# Patient Record
Sex: Male | Born: 1935 | Race: White | Hispanic: No | Smoking: Former smoker
Health system: Southern US, Community
[De-identification: ages and names within clinical notes are randomized; demographics above are authoritative.]

## PROBLEM LIST (undated history)

## (undated) DIAGNOSIS — K635 Polyp of colon: Secondary | ICD-10-CM

## (undated) DIAGNOSIS — C3491 Malignant neoplasm of unspecified part of right bronchus or lung: Principal | ICD-10-CM

## (undated) DIAGNOSIS — Z7189 Other specified counseling: Secondary | ICD-10-CM

## (undated) DIAGNOSIS — I10 Essential (primary) hypertension: Secondary | ICD-10-CM

## (undated) DIAGNOSIS — Z803 Family history of malignant neoplasm of breast: Secondary | ICD-10-CM

## (undated) DIAGNOSIS — E119 Type 2 diabetes mellitus without complications: Secondary | ICD-10-CM

## (undated) DIAGNOSIS — I712 Thoracic aortic aneurysm, without rupture: Secondary | ICD-10-CM

## (undated) DIAGNOSIS — N400 Enlarged prostate without lower urinary tract symptoms: Secondary | ICD-10-CM

## (undated) DIAGNOSIS — I4891 Unspecified atrial fibrillation: Secondary | ICD-10-CM

## (undated) DIAGNOSIS — C3490 Malignant neoplasm of unspecified part of unspecified bronchus or lung: Secondary | ICD-10-CM

## (undated) DIAGNOSIS — C7931 Secondary malignant neoplasm of brain: Secondary | ICD-10-CM

## (undated) DIAGNOSIS — E7800 Pure hypercholesterolemia, unspecified: Secondary | ICD-10-CM

## (undated) DIAGNOSIS — Z5111 Encounter for antineoplastic chemotherapy: Secondary | ICD-10-CM

## (undated) DIAGNOSIS — Z87442 Personal history of urinary calculi: Secondary | ICD-10-CM

## (undated) HISTORY — PX: TONSILLECTOMY: SUR1361

## (undated) HISTORY — DX: Unspecified atrial fibrillation: I48.91

## (undated) HISTORY — DX: Encounter for antineoplastic chemotherapy: Z51.11

## (undated) HISTORY — DX: Essential (primary) hypertension: I10

## (undated) HISTORY — DX: Pure hypercholesterolemia, unspecified: E78.00

## (undated) HISTORY — DX: Malignant neoplasm of unspecified part of right bronchus or lung: C34.91

## (undated) HISTORY — DX: Personal history of urinary calculi: Z87.442

## (undated) HISTORY — DX: Family history of malignant neoplasm of breast: Z80.3

## (undated) HISTORY — PX: COLONOSCOPY W/ POLYPECTOMY: SHX1380

## (undated) HISTORY — DX: Type 2 diabetes mellitus without complications: E11.9

## (undated) HISTORY — DX: Secondary malignant neoplasm of brain: C79.31

## (undated) HISTORY — PX: CERVICAL SPINE SURGERY: SHX589

## (undated) HISTORY — PX: APPENDECTOMY: SHX54

## (undated) HISTORY — DX: Other specified counseling: Z71.89

## (undated) HISTORY — DX: Malignant neoplasm of unspecified part of unspecified bronchus or lung: C34.90

## (undated) HISTORY — PX: CHOLECYSTECTOMY: SHX55

---

## 1998-04-02 ENCOUNTER — Ambulatory Visit (HOSPITAL_COMMUNITY): Admission: RE | Admit: 1998-04-02 | Discharge: 1998-04-02 | Payer: Self-pay | Admitting: Urology

## 1999-09-06 ENCOUNTER — Encounter: Payer: Self-pay | Admitting: Urology

## 1999-09-06 ENCOUNTER — Ambulatory Visit (HOSPITAL_COMMUNITY): Admission: RE | Admit: 1999-09-06 | Discharge: 1999-09-06 | Payer: Self-pay | Admitting: Urology

## 1999-09-30 ENCOUNTER — Encounter: Payer: Self-pay | Admitting: Urology

## 1999-09-30 ENCOUNTER — Ambulatory Visit (HOSPITAL_COMMUNITY): Admission: RE | Admit: 1999-09-30 | Discharge: 1999-09-30 | Payer: Self-pay | Admitting: Urology

## 2000-01-31 ENCOUNTER — Encounter: Payer: Self-pay | Admitting: Urology

## 2000-01-31 ENCOUNTER — Encounter: Admission: RE | Admit: 2000-01-31 | Discharge: 2000-01-31 | Payer: Self-pay | Admitting: Dermatology

## 2000-03-21 ENCOUNTER — Encounter: Payer: Self-pay | Admitting: Urology

## 2000-03-21 ENCOUNTER — Ambulatory Visit (HOSPITAL_COMMUNITY): Admission: RE | Admit: 2000-03-21 | Discharge: 2000-03-21 | Payer: Self-pay | Admitting: Urology

## 2000-03-21 ENCOUNTER — Encounter (INDEPENDENT_AMBULATORY_CARE_PROVIDER_SITE_OTHER): Payer: Self-pay

## 2000-10-12 ENCOUNTER — Encounter: Payer: Self-pay | Admitting: Urology

## 2000-10-12 ENCOUNTER — Ambulatory Visit (HOSPITAL_COMMUNITY): Admission: RE | Admit: 2000-10-12 | Discharge: 2000-10-12 | Payer: Self-pay | Admitting: Urology

## 2001-03-23 ENCOUNTER — Encounter: Payer: Self-pay | Admitting: Urology

## 2001-03-23 ENCOUNTER — Ambulatory Visit (HOSPITAL_COMMUNITY): Admission: RE | Admit: 2001-03-23 | Discharge: 2001-03-23 | Payer: Self-pay | Admitting: General Surgery

## 2001-03-29 ENCOUNTER — Ambulatory Visit (HOSPITAL_COMMUNITY): Admission: RE | Admit: 2001-03-29 | Discharge: 2001-03-29 | Payer: Self-pay | Admitting: Urology

## 2001-03-29 ENCOUNTER — Encounter: Payer: Self-pay | Admitting: Urology

## 2001-04-17 ENCOUNTER — Ambulatory Visit (HOSPITAL_COMMUNITY): Admission: RE | Admit: 2001-04-17 | Discharge: 2001-04-17 | Payer: Self-pay | Admitting: Gastroenterology

## 2002-02-12 ENCOUNTER — Ambulatory Visit (HOSPITAL_COMMUNITY): Admission: RE | Admit: 2002-02-12 | Discharge: 2002-02-12 | Payer: Self-pay | Admitting: Cardiology

## 2002-02-12 ENCOUNTER — Encounter: Payer: Self-pay | Admitting: Cardiology

## 2003-09-23 ENCOUNTER — Encounter: Admission: RE | Admit: 2003-09-23 | Discharge: 2003-10-29 | Payer: Self-pay | Admitting: Family Medicine

## 2004-11-09 ENCOUNTER — Ambulatory Visit (HOSPITAL_COMMUNITY): Admission: RE | Admit: 2004-11-09 | Discharge: 2004-11-09 | Payer: Self-pay | Admitting: Gastroenterology

## 2005-08-08 ENCOUNTER — Ambulatory Visit (HOSPITAL_COMMUNITY): Admission: RE | Admit: 2005-08-08 | Discharge: 2005-08-08 | Payer: Self-pay | Admitting: Family Medicine

## 2006-09-21 ENCOUNTER — Ambulatory Visit (HOSPITAL_COMMUNITY): Admission: RE | Admit: 2006-09-21 | Discharge: 2006-09-21 | Payer: Self-pay | Admitting: Urology

## 2006-11-07 ENCOUNTER — Ambulatory Visit (HOSPITAL_COMMUNITY): Admission: RE | Admit: 2006-11-07 | Discharge: 2006-11-07 | Payer: Self-pay | Admitting: Family Medicine

## 2006-11-13 ENCOUNTER — Ambulatory Visit (HOSPITAL_COMMUNITY): Admission: RE | Admit: 2006-11-13 | Discharge: 2006-11-13 | Payer: Self-pay | Admitting: Family Medicine

## 2006-11-16 ENCOUNTER — Ambulatory Visit (HOSPITAL_COMMUNITY): Admission: RE | Admit: 2006-11-16 | Discharge: 2006-11-16 | Payer: Self-pay | Admitting: Thoracic Surgery

## 2006-11-17 ENCOUNTER — Ambulatory Visit (HOSPITAL_COMMUNITY): Admission: RE | Admit: 2006-11-17 | Discharge: 2006-11-17 | Payer: Self-pay | Admitting: Family Medicine

## 2007-02-20 ENCOUNTER — Encounter: Admission: RE | Admit: 2007-02-20 | Discharge: 2007-02-20 | Payer: Self-pay | Admitting: Thoracic Surgery

## 2007-02-20 ENCOUNTER — Ambulatory Visit: Payer: Self-pay | Admitting: Thoracic Surgery

## 2007-03-02 ENCOUNTER — Ambulatory Visit: Admission: RE | Admit: 2007-03-02 | Discharge: 2007-05-31 | Payer: Self-pay | Admitting: Radiation Oncology

## 2007-03-13 ENCOUNTER — Ambulatory Visit: Payer: Self-pay | Admitting: Thoracic Surgery

## 2007-03-13 ENCOUNTER — Encounter (INDEPENDENT_AMBULATORY_CARE_PROVIDER_SITE_OTHER): Payer: Self-pay | Admitting: Specialist

## 2007-03-13 ENCOUNTER — Inpatient Hospital Stay (HOSPITAL_COMMUNITY): Admission: RE | Admit: 2007-03-13 | Discharge: 2007-03-17 | Payer: Self-pay | Admitting: Thoracic Surgery

## 2007-03-13 HISTORY — PX: OTHER SURGICAL HISTORY: SHX169

## 2007-03-20 ENCOUNTER — Ambulatory Visit: Payer: Self-pay | Admitting: Internal Medicine

## 2007-03-21 ENCOUNTER — Encounter: Admission: RE | Admit: 2007-03-21 | Discharge: 2007-03-21 | Payer: Self-pay | Admitting: Thoracic Surgery

## 2007-03-21 ENCOUNTER — Ambulatory Visit: Payer: Self-pay | Admitting: Thoracic Surgery

## 2007-03-27 ENCOUNTER — Ambulatory Visit: Payer: Self-pay | Admitting: Thoracic Surgery

## 2007-03-27 ENCOUNTER — Encounter: Admission: RE | Admit: 2007-03-27 | Discharge: 2007-03-27 | Payer: Self-pay | Admitting: Thoracic Surgery

## 2007-04-04 ENCOUNTER — Ambulatory Visit: Payer: Self-pay | Admitting: Thoracic Surgery

## 2007-04-04 ENCOUNTER — Encounter: Admission: RE | Admit: 2007-04-04 | Discharge: 2007-04-04 | Payer: Self-pay | Admitting: Thoracic Surgery

## 2007-04-19 LAB — COMPREHENSIVE METABOLIC PANEL
BUN: 17 mg/dL (ref 6–23)
CO2: 22 mEq/L (ref 19–32)
Calcium: 9.9 mg/dL (ref 8.4–10.5)
Chloride: 102 mEq/L (ref 96–112)
Creatinine, Ser: 0.64 mg/dL (ref 0.40–1.50)
Glucose, Bld: 189 mg/dL — ABNORMAL HIGH (ref 70–99)

## 2007-04-19 LAB — CBC WITH DIFFERENTIAL/PLATELET
Basophils Absolute: 0.1 10*3/uL (ref 0.0–0.1)
HCT: 35.4 % — ABNORMAL LOW (ref 38.7–49.9)
HGB: 12.2 g/dL — ABNORMAL LOW (ref 13.0–17.1)
MONO#: 0.5 10*3/uL (ref 0.1–0.9)
NEUT%: 69.5 % (ref 40.0–75.0)
Platelets: 224 10*3/uL (ref 145–400)
WBC: 8.4 10*3/uL (ref 4.0–10.0)
lymph#: 1.6 10*3/uL (ref 0.9–3.3)

## 2007-04-24 ENCOUNTER — Encounter: Admission: RE | Admit: 2007-04-24 | Discharge: 2007-04-24 | Payer: Self-pay | Admitting: Thoracic Surgery

## 2007-04-24 ENCOUNTER — Ambulatory Visit: Payer: Self-pay | Admitting: Thoracic Surgery

## 2007-05-02 ENCOUNTER — Ambulatory Visit: Payer: Self-pay | Admitting: Thoracic Surgery

## 2007-07-11 ENCOUNTER — Encounter: Admission: RE | Admit: 2007-07-11 | Discharge: 2007-07-11 | Payer: Self-pay | Admitting: Thoracic Surgery

## 2007-07-11 ENCOUNTER — Ambulatory Visit: Payer: Self-pay | Admitting: Thoracic Surgery

## 2007-10-09 ENCOUNTER — Ambulatory Visit: Payer: Self-pay | Admitting: Internal Medicine

## 2007-10-11 LAB — CBC WITH DIFFERENTIAL/PLATELET
BASO%: 0.1 % (ref 0.0–2.0)
Basophils Absolute: 0 10*3/uL (ref 0.0–0.1)
HCT: 37.9 % — ABNORMAL LOW (ref 38.7–49.9)
HGB: 13 g/dL (ref 13.0–17.1)
MCHC: 34.3 g/dL (ref 32.0–35.9)
MONO#: 0.4 10*3/uL (ref 0.1–0.9)
NEUT#: 5.4 10*3/uL (ref 1.5–6.5)
NEUT%: 70.4 % (ref 40.0–75.0)
WBC: 7.7 10*3/uL (ref 4.0–10.0)
lymph#: 1.8 10*3/uL (ref 0.9–3.3)

## 2007-10-11 LAB — COMPREHENSIVE METABOLIC PANEL
ALT: 11 U/L (ref 0–53)
CO2: 25 mEq/L (ref 19–32)
Calcium: 10.3 mg/dL (ref 8.4–10.5)
Chloride: 104 mEq/L (ref 96–112)
Creatinine, Ser: 0.85 mg/dL (ref 0.40–1.50)
Total Protein: 6.3 g/dL (ref 6.0–8.3)

## 2007-10-12 ENCOUNTER — Ambulatory Visit (HOSPITAL_COMMUNITY): Admission: RE | Admit: 2007-10-12 | Discharge: 2007-10-12 | Payer: Self-pay | Admitting: Internal Medicine

## 2007-10-19 ENCOUNTER — Ambulatory Visit: Admission: RE | Admit: 2007-10-19 | Discharge: 2007-11-26 | Payer: Self-pay | Admitting: Radiation Oncology

## 2007-11-01 ENCOUNTER — Ambulatory Visit: Payer: Self-pay | Admitting: Thoracic Surgery

## 2008-04-08 ENCOUNTER — Ambulatory Visit: Payer: Self-pay | Admitting: Internal Medicine

## 2008-04-10 LAB — CBC WITH DIFFERENTIAL/PLATELET
Basophils Absolute: 0 10*3/uL (ref 0.0–0.1)
EOS%: 0.9 % (ref 0.0–7.0)
HCT: 39.8 % (ref 38.7–49.9)
HGB: 13.7 g/dL (ref 13.0–17.1)
LYMPH%: 22.5 % (ref 14.0–48.0)
MCH: 28.8 pg (ref 28.0–33.4)
MCHC: 34.4 g/dL (ref 32.0–35.9)
NEUT%: 70.5 % (ref 40.0–75.0)
Platelets: 213 10*3/uL (ref 145–400)
lymph#: 1.9 10*3/uL (ref 0.9–3.3)

## 2008-04-10 LAB — COMPREHENSIVE METABOLIC PANEL
AST: 9 U/L (ref 0–37)
BUN: 20 mg/dL (ref 6–23)
CO2: 23 mEq/L (ref 19–32)
Calcium: 9.8 mg/dL (ref 8.4–10.5)
Chloride: 104 mEq/L (ref 96–112)
Creatinine, Ser: 0.71 mg/dL (ref 0.40–1.50)
Total Bilirubin: 0.4 mg/dL (ref 0.3–1.2)

## 2008-04-14 ENCOUNTER — Ambulatory Visit (HOSPITAL_COMMUNITY): Admission: RE | Admit: 2008-04-14 | Discharge: 2008-04-14 | Payer: Self-pay | Admitting: Internal Medicine

## 2008-04-30 ENCOUNTER — Ambulatory Visit: Payer: Self-pay | Admitting: Thoracic Surgery

## 2008-10-07 ENCOUNTER — Ambulatory Visit: Payer: Self-pay | Admitting: Internal Medicine

## 2008-10-09 ENCOUNTER — Ambulatory Visit (HOSPITAL_COMMUNITY): Admission: RE | Admit: 2008-10-09 | Discharge: 2008-10-09 | Payer: Self-pay | Admitting: Internal Medicine

## 2008-10-09 LAB — CBC WITH DIFFERENTIAL/PLATELET
Basophils Absolute: 0 10*3/uL (ref 0.0–0.1)
Eosinophils Absolute: 0.1 10*3/uL (ref 0.0–0.5)
HCT: 39.5 % (ref 38.7–49.9)
HGB: 13.6 g/dL (ref 13.0–17.1)
LYMPH%: 23.6 % (ref 14.0–48.0)
MONO#: 0.4 10*3/uL (ref 0.1–0.9)
NEUT#: 5.6 10*3/uL (ref 1.5–6.5)
NEUT%: 69.7 % (ref 40.0–75.0)
Platelets: 217 10*3/uL (ref 145–400)
WBC: 8 10*3/uL (ref 4.0–10.0)
lymph#: 1.9 10*3/uL (ref 0.9–3.3)

## 2008-10-09 LAB — COMPREHENSIVE METABOLIC PANEL
CO2: 25 mEq/L (ref 19–32)
Calcium: 9.6 mg/dL (ref 8.4–10.5)
Chloride: 105 mEq/L (ref 96–112)
Creatinine, Ser: 0.69 mg/dL (ref 0.40–1.50)
Glucose, Bld: 148 mg/dL — ABNORMAL HIGH (ref 70–99)
Total Bilirubin: 0.7 mg/dL (ref 0.3–1.2)

## 2008-10-14 ENCOUNTER — Ambulatory Visit: Payer: Self-pay | Admitting: Thoracic Surgery

## 2009-03-09 ENCOUNTER — Emergency Department (HOSPITAL_COMMUNITY): Admission: EM | Admit: 2009-03-09 | Discharge: 2009-03-09 | Payer: Self-pay | Admitting: Emergency Medicine

## 2009-04-08 ENCOUNTER — Ambulatory Visit: Payer: Self-pay | Admitting: Internal Medicine

## 2009-04-10 ENCOUNTER — Ambulatory Visit (HOSPITAL_COMMUNITY): Admission: RE | Admit: 2009-04-10 | Discharge: 2009-04-10 | Payer: Self-pay | Admitting: Internal Medicine

## 2009-04-10 LAB — CBC WITH DIFFERENTIAL/PLATELET
BASO%: 0.2 % (ref 0.0–2.0)
Basophils Absolute: 0 10*3/uL (ref 0.0–0.1)
HCT: 38.6 % (ref 38.4–49.9)
HGB: 13.2 g/dL (ref 13.0–17.1)
LYMPH%: 21.7 % (ref 14.0–49.0)
MCH: 28.9 pg (ref 27.2–33.4)
MCHC: 34.1 g/dL (ref 32.0–36.0)
MONO#: 0.4 10*3/uL (ref 0.1–0.9)
NEUT%: 72.6 % (ref 39.0–75.0)
Platelets: 207 10*3/uL (ref 140–400)
WBC: 7.7 10*3/uL (ref 4.0–10.3)

## 2009-04-10 LAB — COMPREHENSIVE METABOLIC PANEL
ALT: 24 U/L (ref 0–53)
BUN: 21 mg/dL (ref 6–23)
CO2: 25 mEq/L (ref 19–32)
Creatinine, Ser: 0.72 mg/dL (ref 0.40–1.50)
Glucose, Bld: 140 mg/dL — ABNORMAL HIGH (ref 70–99)
Total Bilirubin: 0.8 mg/dL (ref 0.3–1.2)

## 2009-04-14 ENCOUNTER — Ambulatory Visit: Payer: Self-pay | Admitting: Thoracic Surgery

## 2009-10-08 ENCOUNTER — Ambulatory Visit: Payer: Self-pay | Admitting: Internal Medicine

## 2009-10-12 ENCOUNTER — Ambulatory Visit (HOSPITAL_COMMUNITY): Admission: RE | Admit: 2009-10-12 | Discharge: 2009-10-12 | Payer: Self-pay | Admitting: Internal Medicine

## 2009-10-12 LAB — CBC WITH DIFFERENTIAL/PLATELET
BASO%: 0.2 % (ref 0.0–2.0)
EOS%: 0.6 % (ref 0.0–7.0)
LYMPH%: 21.2 % (ref 14.0–49.0)
MCHC: 33.5 g/dL (ref 32.0–36.0)
MCV: 86.6 fL (ref 79.3–98.0)
MONO%: 5.1 % (ref 0.0–14.0)
Platelets: 211 10*3/uL (ref 140–400)
RBC: 4.7 10*6/uL (ref 4.20–5.82)
RDW: 15.1 % — ABNORMAL HIGH (ref 11.0–14.6)

## 2009-10-12 LAB — COMPREHENSIVE METABOLIC PANEL
ALT: 22 U/L (ref 0–53)
AST: 17 U/L (ref 0–37)
Creatinine, Ser: 0.66 mg/dL (ref 0.40–1.50)
Total Bilirubin: 0.7 mg/dL (ref 0.3–1.2)

## 2009-10-14 ENCOUNTER — Ambulatory Visit: Payer: Self-pay | Admitting: Thoracic Surgery

## 2009-10-14 ENCOUNTER — Encounter: Admission: RE | Admit: 2009-10-14 | Discharge: 2009-10-14 | Payer: Self-pay | Admitting: Thoracic Surgery

## 2010-03-02 ENCOUNTER — Ambulatory Visit: Payer: Self-pay

## 2010-03-18 ENCOUNTER — Ambulatory Visit (HOSPITAL_COMMUNITY): Admission: RE | Admit: 2010-03-18 | Discharge: 2010-03-18 | Payer: Self-pay | Admitting: Family Medicine

## 2010-04-09 ENCOUNTER — Ambulatory Visit: Payer: Self-pay | Admitting: Internal Medicine

## 2010-04-12 ENCOUNTER — Ambulatory Visit (HOSPITAL_COMMUNITY): Admission: RE | Admit: 2010-04-12 | Discharge: 2010-04-12 | Payer: Self-pay | Admitting: Internal Medicine

## 2010-04-12 LAB — COMPREHENSIVE METABOLIC PANEL
Albumin: 3.8 g/dL (ref 3.5–5.2)
BUN: 26 mg/dL — ABNORMAL HIGH (ref 6–23)
CO2: 27 mEq/L (ref 19–32)
Chloride: 109 mEq/L (ref 96–112)
Potassium: 4.3 mEq/L (ref 3.5–5.3)
Sodium: 141 mEq/L (ref 135–145)

## 2010-04-12 LAB — CBC WITH DIFFERENTIAL/PLATELET
BASO%: 0.2 % (ref 0.0–2.0)
EOS%: 0.6 % (ref 0.0–7.0)
HCT: 39.3 % (ref 38.4–49.9)
HGB: 13 g/dL (ref 13.0–17.1)
LYMPH%: 18.6 % (ref 14.0–49.0)
MCH: 28.1 pg (ref 27.2–33.4)
MCHC: 33.1 g/dL (ref 32.0–36.0)
MONO%: 5.7 % (ref 0.0–14.0)
NEUT#: 6.1 10*3/uL (ref 1.5–6.5)
WBC: 8.1 10*3/uL (ref 4.0–10.3)

## 2010-04-14 ENCOUNTER — Ambulatory Visit: Payer: Self-pay | Admitting: Thoracic Surgery

## 2010-10-06 ENCOUNTER — Ambulatory Visit: Payer: Self-pay | Admitting: Internal Medicine

## 2010-10-08 ENCOUNTER — Ambulatory Visit (HOSPITAL_COMMUNITY): Admission: RE | Admit: 2010-10-08 | Discharge: 2010-10-08 | Payer: Self-pay | Admitting: Internal Medicine

## 2010-10-08 LAB — CBC WITH DIFFERENTIAL/PLATELET
Eosinophils Absolute: 0.1 10*3/uL (ref 0.0–0.5)
MCH: 27.9 pg (ref 27.2–33.4)
MCHC: 32.9 g/dL (ref 32.0–36.0)
MCV: 84.7 fL (ref 79.3–98.0)
MONO%: 5.7 % (ref 0.0–14.0)
NEUT#: 4.9 10*3/uL (ref 1.5–6.5)
RDW: 14.7 % — ABNORMAL HIGH (ref 11.0–14.6)
lymph#: 2.2 10*3/uL (ref 0.9–3.3)

## 2010-10-08 LAB — COMPREHENSIVE METABOLIC PANEL
ALT: 24 U/L (ref 0–53)
Albumin: 3.9 g/dL (ref 3.5–5.2)
Alkaline Phosphatase: 48 U/L (ref 39–117)
CO2: 26 mEq/L (ref 19–32)
Glucose, Bld: 139 mg/dL — ABNORMAL HIGH (ref 70–99)
Potassium: 4.2 mEq/L (ref 3.5–5.3)
Sodium: 141 mEq/L (ref 135–145)
Total Bilirubin: 0.5 mg/dL (ref 0.3–1.2)
Total Protein: 6.7 g/dL (ref 6.0–8.3)

## 2010-10-12 ENCOUNTER — Ambulatory Visit: Payer: Self-pay | Admitting: Thoracic Surgery

## 2010-12-18 ENCOUNTER — Other Ambulatory Visit: Payer: Self-pay | Admitting: Internal Medicine

## 2010-12-18 DIAGNOSIS — C3490 Malignant neoplasm of unspecified part of unspecified bronchus or lung: Secondary | ICD-10-CM

## 2011-02-18 ENCOUNTER — Encounter (HOSPITAL_COMMUNITY): Payer: Self-pay

## 2011-02-18 ENCOUNTER — Other Ambulatory Visit (HOSPITAL_COMMUNITY): Payer: Self-pay | Admitting: Internal Medicine

## 2011-02-18 ENCOUNTER — Ambulatory Visit (HOSPITAL_COMMUNITY)
Admission: RE | Admit: 2011-02-18 | Discharge: 2011-02-18 | Disposition: A | Discharge status: Home / Self Care | Payer: Medicare Other | Source: Ambulatory Visit | Attending: Internal Medicine | Admitting: Internal Medicine

## 2011-02-18 DIAGNOSIS — R05 Cough: Secondary | ICD-10-CM | POA: Insufficient documentation

## 2011-02-18 DIAGNOSIS — Z85118 Personal history of other malignant neoplasm of bronchus and lung: Secondary | ICD-10-CM | POA: Insufficient documentation

## 2011-02-18 DIAGNOSIS — R059 Cough, unspecified: Secondary | ICD-10-CM | POA: Insufficient documentation

## 2011-02-18 DIAGNOSIS — R0602 Shortness of breath: Secondary | ICD-10-CM | POA: Insufficient documentation

## 2011-02-18 DIAGNOSIS — J329 Chronic sinusitis, unspecified: Secondary | ICD-10-CM

## 2011-03-04 ENCOUNTER — Other Ambulatory Visit: Payer: Self-pay | Admitting: Thoracic Surgery

## 2011-03-04 DIAGNOSIS — C3490 Malignant neoplasm of unspecified part of unspecified bronchus or lung: Secondary | ICD-10-CM

## 2011-04-12 NOTE — Assessment & Plan Note (Signed)
OFFICE VISIT   [name], [name]  DOB:  11-04-1927                                        October 14, 2009  CHART #:  [id number]   The patient came back for followup.  He is 2 years since his surgery and  a CT scan showed no evidence of recurrence of his cancer.  He also had a  chest x-ray that was done that showed normal postoperative changes.  He  is to see Dr. Arbutus Ped tomorrow.  His blood pressure is 152/82, pulse 88,  respirations 18, sats were 97%.  I will see him back in 6 months with a  chest x-ray.   Ines Bloomer, M.D.  Electronically Signed   DPB/MEDQ  D:  10/14/2009  T:  10/15/2009  Job:  [id number]

## 2011-04-12 NOTE — Assessment & Plan Note (Signed)
OFFICE VISIT   CURRIE, DENNIN  DOB:  04/07/1927                                        October 12, 2010  CHART #:  [id number]   REASON FOR OFFICE VISIT:  Continued surveillance status post wedge  resection, seed implantation of right upper lobe March 13, 2007, for  adenocarcinoma.   HISTORY OF PRESENT ILLNESS:  This is a 77 year old Caucasian male who is  status post right VATS, right upper lobe wedge resection, radioactive  seed implantation, lymph node dissection by Dr. Edwyna Shell March 13, 2007.  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  The patient was last seen in the office on Apr 14, 2010, at which time a CAT scan of the chest showed no evidence of  recurrence.  The patient states that overall he is feeling well.  He  denies any fever, chills, shortness of breath, chest pain or sputum  production.  CAT scan of the chest that was done on October 08, 2010,  showed postoperative changes in radioactive seed implantation of the  right upper lobe but no evidence of recurrent or metastatic disease.  In  addition, mild emphysema with pulmonary arterial hypertension, extensive  coronary artery atherosclerotic calcification, fatty infiltration of the  liver, and a 4.5-cm stable ascending thoracic aortic aneurysm with no  evidence of dissection.   PHYSICAL EXAMINATION:  General:  This is a pleasant 83-year Caucasian  male who is in no acute distress who is alert, oriented and cooperative.  Latest vital signs are as follows:  BP is 137/77, heart rate is 97,  respiration rate 18, O2 saturation 96% on room air. Cardiovascular:  Regular rate and rhythm.  No murmurs, gallops or rubs.  Pulmonary:  Clear to auscultation bilaterally.  No rales, wheezes or rhonchi.  Abdomen:  Soft, nontender.  Bowel sounds present.   IMPRESSION AND PLAN:  History of right upper lobe adenocarcinoma.  The  patient has had no  evidence of recurrence from CAT scan that had been  done serially since surgery in April 2008.  The patient does have an  appointment to see Dr. Arbutus Ped on October 13, 2010.  Dr. Edwyna Shell did evaluate the patient and the patient will return for  followup in 6 months with another CT scan of the chest.   Doree Fudge, PA   DZ/MEDQ  D:  10/12/2010  T:  10/13/2010  Job:  [id number]   cc:   Dr. Earlene Plater

## 2011-04-12 NOTE — Assessment & Plan Note (Signed)
OFFICE VISIT   KRIKOR, WILLET  DOB:  08-Jul-1927                                        November 01, 2007  CHART #:  [id number]   Mr. Hanna came in for followup today.  I reviewed his CT scan that  showed no evidence of recurrence of his cancer.  He is now 8 months  since  his surgery.  He will have another CT scan done in May and we  will see him back again at that time.  Overall, though, he seems to be  doing well since his resection and seed implantation.   Ines Bloomer, M.D.  Electronically Signed   DPB/MEDQ  D:  11/01/2007  T:  11/02/2007  Job:  [id number]

## 2011-04-12 NOTE — Assessment & Plan Note (Signed)
OFFICE VISIT   SHERREL, SHAFER  DOB:  15-Oct-1927                                        Apr 14, 2010  CHART #:  [id number]   He came today for follow up.  His CT scan showed no evidence of  recurrence.  He is now over 3 years.  We will see him back in 6 months  with another CT.  His blood pressure is 152/81, pulse 81, respirations  18, sats were 96%.   Ines Bloomer, M.D.  Electronically Signed   DPB/MEDQ  D:  04/14/2010  T:  04/15/2010  Job:  [id number]   cc:   Lajuana Matte, MD  Corrie Mckusick, MD

## 2011-04-12 NOTE — Assessment & Plan Note (Signed)
OFFICE VISIT   [name], [name]  DOB:  1927/05/23                                        October 14, 2008  CHART #:  [id number]   HISTORY:  The patient is a 77 year old gentleman seen in routine  followup, status post a wedge resection with radioactive seed  implantation and lymph node sampling via right VATS and mini thoracotomy  on 03/13/2007.  Pathology at that time revealed moderately  differentiated adenocarcinoma.  Lymph nodes were negative for metastatic  carcinoma.  The patient is seen today in routine followup.  A CT scan  was obtained on October 09, 2008.  Findings are consistent with stable  postoperative therapy changes in the right upper lobe and no evidence of  local recurrence or distant metastasis.   PHYSICAL EXAMINATION:  VITAL SIGNS:  Blood pressure is 166/90, pulse 84,  respirations 18, and oxygen saturation is 95%.  This is on room air.  GENERAL:  This is a moderately obese white male, no acute distress.  PULMONARY:  Clear breath sounds throughout.  CARDIAC:  Regular rate and rhythm.  Normal S1 and S2.  Incisions are all  well healed.   ASSESSMENT:  This is a routine followup on the patient with his stage IA  non-small cell lung cancer, moderately differentiated adenocarcinoma.  He reports that he is scheduled to see Dr. Arbutus Ped later in this week.  Dr. Edwyna Shell reviewed his CT scan and discussed the findings.  We are well  pleased with his progress as is he.  Dr. Scheryl Darter plan at this juncture  is to see him again in approximately 6 months with a repeat CT scan at  that time.   Rowe Clack, P.A.-C.   Sherryll Burger  D:  10/14/2008  T:  10/14/2008  Job:  [id number]   cc:   Ines Bloomer, M.D.  Lajuana Matte, MD

## 2011-04-12 NOTE — Assessment & Plan Note (Signed)
OFFICE VISIT   [name], [name]  DOB:  12-Apr-1927                                        Apr 24, 2007  CHART #:  [id number]   Mr. [name] came today. He had some drainage from his thoracotomy, a  VATS incision and we started him on antibiotic Keflex. He is feeling a  lot better. His chest tube incisions are well-healed. There is an area  of slight erythema at the anterior portion of his chest woundl plan to  continue his antibiotic and see him back in a week. If he still has some  softness in the wound I may need to open the chest incision.   [name], M.D.  Electronically Signed   DPB/MEDQ  D:  04/24/2007  T:  04/24/2007  Job:  [id number]

## 2011-04-12 NOTE — Assessment & Plan Note (Signed)
OFFICE VISIT   LEEMON, AYALA  DOB:  1927/01/26                                        April 30, 2008  CHART #:  [id number]   HISTORY OF PRESENTING ILLNESS:  Mr. Sorbo is a 77 year old Caucasian  male who is status post wedge resection with seed implantation and node  sampling with right VATS and mini thoracotomy on 03/13/2007.  Pathology  at that time showed moderately differentiated adenocarcinoma.  Lymph  nodes were negative for metastatic carcinoma.  Patient was last seen in  the office on 11/01/2007, and at that time the CT scan showed no  evidence of recurrent cancer.  Currently, the patient is doing very  well.  He has no complaints at this time.   PHYSICAL EXAMINATION:  GENERAL:  This is a 77 year old Caucasian male  who is in no acute distress.  He is alert, oriented and cooperative.  Blood pressure 143/72, pulse rate 85, respiration 18, oxygen saturation  95% on room air.  CARDIOVASCULAR:  Regular rate and rhythm.  LUNGS:  Slightly diminished at the bases, but otherwise clear.   CT scan of the chest today again shows no evidence for recurrent tumor.   IMPRESSION/PLAN:  No evidence of recurrence.  Patient has a CAT scan  scheduled for November and an appointment to see Dr. Arbutus Ped.  Dr.  Edwyna Shell will then see him after this.  Patient is instructed to call for  any questions, problems or concerns in the interim.   Doree Fudge, PA   DZ/MEDQ  D:  04/30/2008  T:  04/30/2008  Job:  [id number]   cc:   Lajuana Matte, MD  Ines Bloomer, M.D.

## 2011-04-12 NOTE — Letter (Signed)
July 11, 2007   Si Gaul, MD.  [phone]  [place], [place], [address].   Re:  SANDY, BLOUCH               DOB:  Aug 18, 1927   Dear Arbutus Ped:   I saw Mr. Ledin in the office today.  Chest x-ray is stable and shows  a seed implantation in good position, no evidence of any recurrence.  He  is doing well overall.  I plan to see him back again in three months if  you would obtain the next CT scan, which he says will be in October or  November.  I appreciate the opportunity of seeing Mr. Guo.   Ines Bloomer, M.D.  Electronically Signed   DPB/MEDQ  D:  07/11/2007  T:  07/12/2007  Job:  [id number]

## 2011-04-12 NOTE — Assessment & Plan Note (Signed)
OFFICE VISIT   [name], [name]  DOB:  03/09/1927                                        Apr 14, 2009  CHART #:  [id number]   The patient came today for followup after CT scan, now 2 years since his  surgery and shows no evidence of recurrence of his cancer.  His blood  pressure is 161/78, pulse 78, respirations 20, and saturations were 98%.  He is doing well overall, although he recently had a hand injury, but  this is healing without difficulty.  He will be seeing Dr. Arbutus Ped  tomorrow and I will see him in 6 months with a chest x-ray.   Ines Bloomer, M.D.  Electronically Signed   DPB/MEDQ  D:  04/14/2009  T:  04/15/2009  Job:  [id number]

## 2011-04-12 NOTE — Assessment & Plan Note (Signed)
OFFICE VISIT   [name], [name]  DOB:                                                    May 02, 2007  CHART #:  [id number]   SUMMARY:  The patient came today and we examined his wound.  The area of  his chest wound that had opened up, now appears to be healed.  I do not  think there will be any further problems with that as his chest tube  sites are also well-healed.  Blood pressure is 148/80, pulse 92,  respirations 18, sats 95%.  I plan to see him back again in 2 months  with a chest x-ray.   Ines Bloomer, M.D.  Electronically Signed   DPB/MEDQ  D:  05/02/2007  T:  05/02/2007  Job:  [id number]

## 2011-04-13 ENCOUNTER — Ambulatory Visit: Payer: Medicare Other | Admitting: Thoracic Surgery

## 2011-04-13 ENCOUNTER — Other Ambulatory Visit: Payer: Medicare Other

## 2011-04-15 NOTE — Procedures (Signed)
[name], [name]               ACCOUNT NO.:  [id number]   MEDICAL RECORD NO.:  [id number]          PATIENT TYPE:  OUT   LOCATION:  RESP                          FACILITY:  [facility]   PHYSICIAN:  Edward L. Juanetta Gosling, M.D.DATE OF BIRTH:  [birth year]   DATE OF PROCEDURE:  DATE OF DISCHARGE:  11/17/2006                            PULMONARY FUNCTION TEST   1. Spirometry shows no ventilatory defect but does show evidence of      air flow obstruction most marked in the smaller airways.  2. Lung volumes are normal.  3. DLCO was normal.  4. Arterial blood gases are normal.  5. There is no significant bronchodilator effect.      Edward L. Juanetta Gosling, M.D.  Electronically Signed     ELH/MEDQ  D:  11/20/2006  T:  11/20/2006  Job:  [id number]   cc:   Kirk Ruths, M.D.  Fax: [phone]

## 2011-04-15 NOTE — H&P (Signed)
[name], [name]               ACCOUNT NO.:  [id number]   MEDICAL RECORD NO.:  [id number]          PATIENT TYPE:  REC   LOCATION:  [facility]                         FACILITY:  [facility]   PHYSICIAN:  Ines Bloomer, M.D. DATE OF BIRTH:  Apr 16, 1927   DATE OF ADMISSION:  03/02/2007  DATE OF DISCHARGE:                              HISTORY & PHYSICAL   HISTORY OF PRESENT ILLNESS:  This 77 year old patient has a long history  of smoking, probably one-and-a-half packs of cigarettes a day.  He had  dyspnea and a cough and a chest x-ray showed a right upper lobe lesion  in the anterior segment.  CT scan showed a 1.7 x 2.3 ground-glass nodule  in the right upper lobe.  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  We  initially followed it with a CT scan, but it showed that it has remained  stable.  He has had no hemoptysis, fevers, chills, excessive sputum.  He  saw Dr. Margaretmary Bayley and is being admitted for possible seed  implantation.   PAST MEDICAL HISTORY:  Significant for:  1. Hypertension.  2. Hypercholesterolemia.  3. Diabetes type 2.  4. He also has  history of kidney stones.   ALLERGIES:  SULFA.   MEDICATIONS:  Include:  1. Glipizide 5 mg two twice a day.  2. Zocor 40 mg a day.  3. Altace 2.5 mg a day.  4. UroXatral 10 mg daily.   FAMILY HISTORY:  Noncontributory.   SOCIAL HISTORY:  He is married, has two children.  He smokes one-and-a-  half packs of cigarettes a day and does not drink alcohol on a regular  basis.   REVIEW OF SYSTEMS:  GENERAL:  He is 245 pounds.  His height is 5 feet 10  inches.  Weight is stable.  CARDIAC:  No angina or atrial fibrillation.  PULMONARY:  See History of Present Illness.  GI:  No nausea, vomiting,  constipation or diarrhea.  GU:  See Past Medical History.  VASCULAR:  No  claudication, DVT, TIAs.  NEUROLOGICAL:  No headaches, blackouts or  seizures.  MUSCULOSKELETAL:  He has  arthritis and joint pain.  No  myalgias.  PSYCHIATRIC:  No psychiatric illness.  HEENT:  No recent  changes in his eyesight or hearing, but does wear a hearing aid.  HEMATOLOGICAL:  No problems with bleeding, clotting disorders or anemia.   PHYSICAL EXAMINATION:  GENERAL:  He is a well-developed Caucasian male  in no acute distress.  VITALS:  His blood pressure is 138/80, pulse 82, respirations 18, sats  are 96%.  Pulmonary function tests showed an FVC of 4.82 with an FEV-1  of 2.38.  HEAD:  Atraumatic.  EYES:  Pupils are equal and reactive to light and accommodation.  EARS:  He has bilateral hearing aids.  NOSE:  No septal deviation.  THROAT:  Without lesion.  NECK:  Supple without thyromegaly.  There is no supraclavicular or  axillary adenopathy.  CHEST:  Clear to auscultation and percussion.  HEART:  Regular sinus  rhythm.  No murmurs.  ABDOMEN:  Soft.  There is no hepatosplenomegaly.  Bowel sounds are  normal.  EXTREMITIES:  Pulses are 2+.  There is no clubbing or edema.  NEUROLOGICAL:  He is oriented x3.  Sensory and motor intact.  Cranial  nerves II-XII are intact.  SKIN:  Without lesion.   IMPRESSION:  1. Right upper lobe nodules.  2. Tobacco abuse.  3. Diabetes type 2.  4. Hypertension.  5. Hypercholesterolemia.   PLAN:  Right VATS wedge resection of right upper lobe lesion with seed  implantation.   Dictation ended at this point.      Ines Bloomer, M.D.  Electronically Signed     DPB/MEDQ  D:  03/10/2007  T:  03/10/2007  Job:  [id number]

## 2011-04-15 NOTE — Procedures (Signed)
[facility]  Patient:    [name], [name] Visit Number: [id number] MRN: [id number]          Service Type: OUT Location: RAD Attending Physician:  Cain Sieve Dictated by:   Delton See, P.A. Proc. Date: 02/12/02 Admit Date:  02/12/2002 Discharge Date: 02/12/2002   CC:         Patrica Duel, M.D.   Stress Test  DATE OF BIRTH:  05-27-1927  BRIEF HISTORY:  Mr. Dady is a pleasant 77 year old male with multiple risk factors for coronary artery disease including positive family history, elevated cholesterol levels, overweight, remote tobacco history, diabetes mellitus, and hypertension.  He was referred to Dr. Daleen Squibb for further evaluation and for stress testing.  The patient was scheduled for an exercise Cardiolite on February 12, 2002.  Prior to the study he reported no chest pain or shortness of breath.  His baseline EKG showed sinus rhythm, rate 78 beats per minute without ischemic changes, blood pressure 150/88.  Target heart rate 125 beats per minute.  The patient was able to exercise for a total of 7 minutes and 25 seconds reaching a maximum heart rate of 146 beats per minute.  He was injected with the Cardiolite at six minutes into the study at which time his heart rate was 120.  The patient tolerated the procedure well.  He had no chest pain.  He was mildly short of breath and feeling fatigued.  In recovery he developed a rapid narrow complex rhythm at approximately 170-180 beats per minute, possibly atrial flutter with 2:1 block.  He also has an elevated blood pressure of 200/100.  The patient was asymptomatic with this.  He was not aware of his rapid heart rate.  This broke spontaneously on its own and his blood pressure returned to a normal range of 140/88.  The test was concluded.  The EKG did not show any significant ischemic changes except for some ST depression during the rapid heart rate.  The final images are pending at  time of this dictation. Dictated by:   Delton See, P.A. Attending Physician:  Cain Sieve DD:  02/12/02 TD:  02/14/02 Job: [id number]

## 2011-04-15 NOTE — Procedures (Signed)
[facility]  Patient:    [name], [name]                      MRN: [id number] Proc. Date: 03/23/01 Adm. Date:  [id number] Attending:  Tania Ade CC:         Jonell Cluck, M.D.   Procedure Report  PROCEDURE:  Cystourethroscopy, right retrograde ureteral pyelogram, rigid and flexible right ureterorenoscopy with holmium laser lithotripsy and placement and right double J stent.  SURGEON:  Gaynelle Arabian, M.D.  ANESTHESIA:  General endotracheal.  ESTIMATED BLOOD LOSS:  Negligible.  TUBES:  26 cm 6 [place] surgitek double pigtail stent.  COMPLICATIONS:  None.  INDICATIONS FOR PROCEDURE:  [name] is a very nice 77 year old white male who presented with known kidney stones and 3-5 red cells per high powered field microscopically. He underwent an ultrasound of the kidney which revealed what appeared to be a right hydronephrosis and an IVP was performed which revealed ______ excretion on the left. There were lower pole stones noted on the right but he had a moderate hydronephrosis with some caliectasis and the ureter could not be well visualized. After understanding the risks, benefits, and alternatives, he elected to undergo cystoretrograde pyelogram and possible ureteroscopy.  DESCRIPTION OF PROCEDURE:  The patient was placed in the supine position under proper general endotracheal anesthesia and was placed in the dorsal lithotomy position and prepped and draped with Betadine in a sterile fashion. Cystourethroscopy was performed with a 22.5 [place] ACMI panendoscope and utilizing the 12 and 70 degree lenses, the bladder was carefully inspected. Of interest, a moderate trilobar hypertrophy and grade 1 trabeculation was noted. Efflux of clear urine was noted from the normally placed ureteral orifices bilaterally. No other lesions were noted to be present. Under fluoroscopic guidance, a right retrograde ureteral pyelogram was performed with  a 6 [place] open ended catheter. There was some ureterectasis noted and some actual narrowing and possible obstruction in the mid ureter. A 0.38 French Teflon coated guidewire was placed into the right renal pelvis and the lower third ureter was balloon dilated with a 10 cm, 5 mm, 20 atmosphere maximum burst pressure balloon to 10 atmospheres for 3 minutes. Following this, the balloon was removed and ureteroscopy was performed with a ACMI short thin ureteroscope for the lower two-thirds ureter. Of interest, the lower third ureter was essentially normal but the middle third ureter had significant areas of cystitis cystica and cystitis glandularis felt to be from chronic inflammation or chronic stone passage. The long thin ACMI ureteroscope was then used and the ureter was visualized to the ureteral pelvic junction and again there were some areas of ureteritis cystica and ureteritis glandularis noted, but no actual neoplastic lesions were noted to be present. Following this, the ureteroscope was removed and an Olympus flexible ureterorenoscope was placed and the intrarenal collecting system was carefully visualized. In the lower pole collecting system, there were two stones noted and it was felt that these were probably causing inflammation possible from multiple stone fragments being passed and that they should be treated. The stones could not be reached with the 200 micron laser fiber with the holmium laser fiber due to the rigidity of the scope so they were grasped with a nitinol basket and placed into the right renal pelvis. They were too large to extract. Utilizing the 200 micron holmium laser fiber with a setting of 0.6 joules with a repetition rate of 10, the stones were  fragmented; however, while we were fragmenting both fragments popped up into the outer middle caliceal collecting system and really could not be visualized very well due to the need for deflection. It was felt that the  case should be stopped at that point and that extracorporeal shock wave lithotripsy was probably more appropriate and that the stones due to the problems that they caused probably should be treated. The flexible ureteroscope was visually removed. The 0.38 French Teflon coated guidewire then replaced into the kidney and under fluoroscopic guidance, a 26 cm 6 [place] surgitek double pigtail stent was placed and noted to be in good position within the right renal pelvis within the bladder. A pullout string was attached to the penis, bladder drained, panendoscope was removed. The patient was taken to the recovery room stable.  PLAN:  Right ESWL. DD:  03/23/01 TD:  03/24/01 Job: [id number]

## 2011-04-15 NOTE — Discharge Summary (Signed)
[name], ROLPH               ACCOUNT NO.:  [id number]   MEDICAL RECORD NO.:  [id number]          PATIENT TYPE:  INP   LOCATION:  2032                         FACILITY:  [facility]   PHYSICIAN:  [name], M.D. DATE OF BIRTH:  11/19/1927   DATE OF ADMISSION:  03/13/2007  DATE OF DISCHARGE:  03/16/2007                               DISCHARGE SUMMARY   PRIMARY ADMITTING DIAGNOSIS:  Right upper lobe lung lesion.   ADDITIONAL/DISCHARGE DIAGNOSES:  1. Adenocarcinoma of the right upper lobe.  2. Hypertension.  3. Hypercholesterolemia.  4. Type 2 diabetes mellitus.  5. History of kidney stones.  6. Ongoing tobacco abuse.   PROCEDURES PERFORMED:  1. Right VATS.  2. Right upper lobe wedge resection with radioactive seed implant and      lymph node dissection.   HISTORY:  The patient is a 77 year old male, who had history of dyspnea  and cough.  A chest x-ray was performed which showed a right upper lobe  lesion in the anterior segment.  A CT scan was subsequently performed  which showed a 1.7 x 2.3 cm ground-glass nodule in the right upper lobe.  He underwent a PET scan, and this showed an SCV of 2.3 in the right  upper lobe as well.  He was seen by Dr. Edwyna French and initially was  followed with repeat CT scans, and these have shown that the lesion has  remained stable.  He has also been seen by Dr. 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  The patient has had  no hemoptysis, fevers, weight loss, chills, or sputum production.  Dr.  Edwyna French explained the risks, benefits, and alternatives of surgery.  The  patient and he agreed to proceed.   HOSPITAL COURSE:  [name] was admitted to [facility] on  March 13, 2007, and underwent a right VATS with right upper lobe wedge  resection as described in detail above.  Intraoperative frozen sections  were positive for  adenocarcinoma with all bronchial margins negative.  He also underwent a radioactive seed implant.  He tolerated the  procedure well and was transferred to the floor in stable condition.  Postoperatively, he has done very well.  His chest tubes have been  removed in the usual fashion, and his followup chest x-ray have remained  stable.  He has been ambulating in the halls without problem.  His diet  has been advanced, and he is presently a regular diet without  difficulty.  His pain is well controlled on p.o. pain medications.  His  incisions are all healing well.  His most recent labs show a hemoglobin  of 12, hematocrit 35, platelets 194, white count 9.9, sodium 137,  potassium 3.6 which has been supplemented.  BUN 10, creatinine 0.57.  He  will have a repeat PA and lateral chest x-ray on the morning of March 17, 2007.  It is felt that if he continues to remain stable over the  next 24-48 hours, he will  hopefully be ready for discharge home.   DISCHARGE MEDICATIONS:  1. Tylox 1-2 q.4h. p.r.n. pain.  2. Combivent 2 puffs q.i.d.  3. Lopressor 25 mg b.i.d.  4. UroXatral 10 mg daily.  5. Zocor 40 mg daily.  6. Folic acid 1 mg daily.  7. Glyburide/metformin 5/200 b.i.d.  8. Aspirin 81 mg daily.  9. Lopressor 25 mg b.i.d.  10.He is asked to hold lisinopril until he is seen by Dr. Edwyna French.   DISCHARGE INSTRUCTIONS:  1. He is asked to refrain from driving, heavy lifting, or strenuous      activity.  He may continue ambulating daily and using his incentive      spirometer.  2. He may shower daily and clean his incisions with soap and water.  3. He will continue his same preoperative diet.   DISCHARGE FOLLOW-UP:  He will be contacted by our office for an  appointment to see Dr. Edwyna French in 1 week with a chest x-ray from [facility].  In the interim, if he experiences problems or has  questions, he is asked to contact our office immediately.      [name],  P.A.      [name], M.D.  Electronically Signed    GC/MEDQ  D:  03/16/2007  T:  03/17/2007  Job:  [id number]   cc:   [name] Dr. Renette French, [facility]  [name], M.D.

## 2011-04-15 NOTE — Op Note (Signed)
NAMETHELTON, GRACA               ACCOUNT NO.:  [id number]   MEDICAL RECORD NO.:  [id number]          PATIENT TYPE:  INP   LOCATION:  2550                         FACILITY:  [facility]   PHYSICIAN:  Ines Bloomer, M.D. DATE OF BIRTH:  03-10-1927   DATE OF PROCEDURE:  03/13/2007  DATE OF DISCHARGE:                               OPERATIVE REPORT   PREOPERATIVE DIAGNOSIS:  Right upper lobe lesion.   POSTOPERATIVE DIAGNOSIS:  Adenocarcinoma of the right upper lobe.   OPERATION PERFORMED:  Wedge resection with seed implantation and node  sampling with right VATS minithoracotomy.   SURGEON:  Dr. Patricia Nettle. Burney.   FIRST ASSISTANT:  Coral Ceo, PA-C   ANESTHESIA:  General anesthesia.   After percutaneous insertion of a dual-lumen tube, the right lung was  dropped.  The patient was turned right lateral thoracotomy position.  Two trocar sites were made in the anterior and midaxillary line at the  seventh intercostal space and a 30 scope was inserted and through the 30  degree scope, the patient had evidence of severe emphysema.  You could  see marked anthracosis of the lung.  A 5-6 cm incision was made  anteriorly over the third intercostal space.  The latissimus was  retracted laterally.  The serratus was split and the third intercostal  space was entered.  The lesion was palpated in the anterior segment near  the minor fissure.  It was grasped with a Duval lung clamp and then  using the EZ45 stapler, it was resected with several applications.  Frozen section revealed adenocarcinoma with negative bronchial margins.  Sutures were placed at the superior and inferior portion of the staple  line.  Lung was retracted laterally and then we dissected out the hilum,  dissecting out 10 4R and 10 R nodes.  No 11 R nodes could be found.  These were dissected out and biopsied.  Then a coronary ring was placed  in the midportion of the staple line at closest margin to the cancer and  sutured in  place with 4-0 Prolene.  4-0 Prolene was placed through the  mesh and the mesh was tied down on the lung.  Then the anterior and  superior and inferior sutures were placed to secure the mesh over the  staple line and lateral sutures were also placed to secure the mesh.  FloSeal was applied to the staple lines.  Two chest tubes were brought  into the trocar site and tied in place with 0-0 silk.  A On-Q catheter  was inserted subpleurally in the usual fashion.  This was a single On-Q  catheter.  The chest was closed with two pericostals drilling through  the fourth rib and passing around the fifth, passed around the sixth,  the third rib.  This was #2 Vicryl, #1 Vicryl in the muscle layer, 2-0  Vicryl in subcutaneous tissue and Dermabond for the skin.  The patient  was turned to the recovery room in stable condition.      Ines Bloomer, M.D.  Electronically Signed    DPB/MEDQ  D:  03/13/2007  T:  03/13/2007  Job:  [id number]

## 2011-10-10 ENCOUNTER — Ambulatory Visit (HOSPITAL_COMMUNITY)
Admission: RE | Admit: 2011-10-10 | Discharge: 2011-10-10 | Disposition: A | Discharge status: Home / Self Care | Payer: Medicare Other | Source: Ambulatory Visit | Attending: Internal Medicine | Admitting: Internal Medicine

## 2011-10-10 ENCOUNTER — Other Ambulatory Visit (HOSPITAL_BASED_OUTPATIENT_CLINIC_OR_DEPARTMENT_OTHER): Payer: Medicare Other | Admitting: Lab

## 2011-10-10 ENCOUNTER — Other Ambulatory Visit: Payer: Self-pay | Admitting: Internal Medicine

## 2011-10-10 ENCOUNTER — Encounter (HOSPITAL_COMMUNITY): Payer: Self-pay

## 2011-10-10 DIAGNOSIS — K7689 Other specified diseases of liver: Secondary | ICD-10-CM | POA: Insufficient documentation

## 2011-10-10 DIAGNOSIS — C3490 Malignant neoplasm of unspecified part of unspecified bronchus or lung: Secondary | ICD-10-CM

## 2011-10-10 DIAGNOSIS — C3410 Malignant neoplasm of upper lobe, unspecified bronchus or lung: Secondary | ICD-10-CM

## 2011-10-10 DIAGNOSIS — I712 Thoracic aortic aneurysm, without rupture, unspecified: Secondary | ICD-10-CM | POA: Insufficient documentation

## 2011-10-10 DIAGNOSIS — I2789 Other specified pulmonary heart diseases: Secondary | ICD-10-CM | POA: Insufficient documentation

## 2011-10-10 DIAGNOSIS — I2510 Atherosclerotic heart disease of native coronary artery without angina pectoris: Secondary | ICD-10-CM | POA: Insufficient documentation

## 2011-10-10 LAB — CMP (CANCER CENTER ONLY)
AST: 19 U/L (ref 11–38)
Albumin: 3.6 g/dL (ref 3.3–5.5)
BUN, Bld: 19 mg/dL (ref 7–22)
Calcium: 10 mg/dL (ref 8.0–10.3)
Chloride: 100 mEq/L (ref 98–108)
Glucose, Bld: 170 mg/dL — ABNORMAL HIGH (ref 73–118)
Potassium: 3.9 mEq/L (ref 3.3–4.7)

## 2011-10-10 LAB — CBC WITH DIFFERENTIAL/PLATELET
Basophils Absolute: 0 10*3/uL (ref 0.0–0.1)
Eosinophils Absolute: 0.1 10*3/uL (ref 0.0–0.5)
HGB: 12.7 g/dL — ABNORMAL LOW (ref 13.0–17.1)
MONO#: 0.3 10*3/uL (ref 0.1–0.9)
NEUT#: 5.6 10*3/uL (ref 1.5–6.5)
RDW: 15.6 % — ABNORMAL HIGH (ref 11.0–14.6)
lymph#: 1.3 10*3/uL (ref 0.9–3.3)

## 2011-10-10 MED ORDER — IOHEXOL 300 MG/ML  SOLN
100.0000 mL | Freq: Once | INTRAMUSCULAR | Status: AC | PRN
  Administered 2011-10-10: 100 mL via INTRAVENOUS

## 2011-10-28 ENCOUNTER — Telehealth: Payer: Self-pay | Admitting: Internal Medicine

## 2011-10-28 ENCOUNTER — Encounter: Payer: Self-pay | Admitting: Thoracic Surgery

## 2011-10-28 ENCOUNTER — Encounter: Payer: Self-pay | Admitting: *Deleted

## 2011-10-28 ENCOUNTER — Encounter: Payer: Self-pay | Admitting: Internal Medicine

## 2011-10-28 DIAGNOSIS — Z87442 Personal history of urinary calculi: Secondary | ICD-10-CM | POA: Insufficient documentation

## 2011-10-28 DIAGNOSIS — E7800 Pure hypercholesterolemia, unspecified: Secondary | ICD-10-CM | POA: Insufficient documentation

## 2011-10-28 NOTE — Telephone Encounter (Signed)
No answer

## 2011-10-31 ENCOUNTER — Ambulatory Visit (HOSPITAL_BASED_OUTPATIENT_CLINIC_OR_DEPARTMENT_OTHER): Payer: Medicare Other | Admitting: Internal Medicine

## 2011-10-31 VITALS — BP 118/74 | HR 98 | Temp 96.8°F | Ht 69.0 in | Wt 247.0 lb

## 2011-10-31 DIAGNOSIS — C3410 Malignant neoplasm of upper lobe, unspecified bronchus or lung: Secondary | ICD-10-CM

## 2011-10-31 DIAGNOSIS — C3490 Malignant neoplasm of unspecified part of unspecified bronchus or lung: Secondary | ICD-10-CM

## 2011-10-31 NOTE — Progress Notes (Signed)
[facility] OFFICE PROGRESS NOTE   DIAGNOSIS: Stage IA non-small cell lung cancer, moderately differentiated adenocarcinoma, diagnosed in April 2008.  PRIOR THERAPY: Status post wedge resection of the right upper lobe with seed implants under the care of Dr Edwyna Shell on October 13, 2007.  CURRENT THERAPY: Observation.  INTERVAL HISTORY: [name] 77 y.o. male returns to the clinic today for annual followup visit. The patient is feeling fine today. He denied having any significant complaints. He has no chest pain or shortness breath, no nausea or vomiting, no cough or hemoptysis, no weight loss or night sweats. He has repeat CT scan of the chest with contrast performed on 10/10/2011 and the patient is here today for evaluation and discussion of his scan results. He also mentions some sweating and his lip as well as knees after the IV contrast injection, but it resolved spontaneously.  MEDICAL HISTORY: Past Medical History  Diagnosis Date  . Diabetes mellitus   . Hypertension   . lung ca dx'd 01/2007    seed implants  . Hypercholesteremia   . History of kidney stones   . Tobacco abuse     ALLERGIES:  is allergic to sulfa antibiotics.  MEDICATIONS:  Current Outpatient Prescriptions  Medication Sig Dispense Refill  . alfuzosin (UROXATRAL) 10 MG 24 hr tablet Take 10 mg by mouth daily.        Marland Kitchen aspirin 81 MG tablet Take 81 mg by mouth daily.        Marland Kitchen co-enzyme Q-10 30 MG capsule Take 30 mg by mouth 3 (three) times daily.        . folic acid (FOLVITE) 1 MG tablet Take 1 mg by mouth daily.        Marland Kitchen glipiZIDE (GLUCOTROL) 5 MG tablet Take 5 mg by mouth 2 (two) times daily before a meal.        . losartan (COZAAR) 50 MG tablet Take 50 mg by mouth daily.        . simvastatin (ZOCOR) 40 MG tablet Take 40 mg by mouth at bedtime.        . sitaGLIPtan-metformin (JANUMET) 50-1000 MG per tablet Take 1 tablet by mouth 2 (two) times daily with a meal.          SURGICAL HISTORY:    Past Surgical History  Procedure Date  . Wedge resection with seed implantation and node sampling with right vats 03/13/2007    REVIEW OF SYSTEMS:  A comprehensive review of systems was negative.   PHYSICAL EXAMINATION: General appearance: alert, cooperative and no distress Head: Normocephalic, without obvious abnormality, atraumatic Neck: no adenopathy Lymph nodes: Cervical, supraclavicular, and axillary nodes normal. Resp: clear to auscultation bilaterally Cardio: regular rate and rhythm, S1, S2 normal, no murmur, click, rub or gallop GI: soft, non-tender; bowel sounds normal; no masses,  no organomegaly Extremities: extremities normal, atraumatic, no cyanosis or edema Neurologic: Alert and oriented X 3, normal strength and tone. Normal symmetric reflexes. Normal coordination and gait  ECOG PERFORMANCE STATUS: 1 - Symptomatic but completely ambulatory  Blood pressure 118/74, pulse 98, temperature 96.8 F (36 C), temperature source Oral, height 5\' 9"  (1.753 m), weight 247 lb (112.038 kg).  LABORATORY DATA: Lab Results  Component Value Date   WBC 7.4 10/10/2011   HGB 12.7* 10/10/2011   HCT 37.5* 10/10/2011   MCV 83.6 10/10/2011   PLT 132* 10/10/2011      Chemistry      Component Value Date/Time   NA 144  10/10/2011 0733   NA 141 10/08/2010 0929   K 3.9 10/10/2011 0733   K 4.2 10/08/2010 0929   CL 100 10/10/2011 0733   CL 106 10/08/2010 0929   CO2 28 10/10/2011 0733   CO2 26 10/08/2010 0929   BUN 19 10/10/2011 0733   BUN 19 10/08/2010 0929   CREATININE 0.6 10/10/2011 0733   CREATININE 0.84 10/08/2010 0929      Component Value Date/Time   CALCIUM 10.0 10/10/2011 0733   CALCIUM 10.2 10/08/2010 0929   ALKPHOS 50 10/10/2011 0733   ALKPHOS 48 10/08/2010 0929   AST 19 10/10/2011 0733   AST 21 10/08/2010 0929   ALT 24 10/08/2010 0929   BILITOT 0.50 10/10/2011 0733   BILITOT 0.5 10/08/2010 0929       RADIOGRAPHIC STUDIES: Ct Chest W Contrast  10/10/2011   *RADIOLOGY REPORT*  Clinical Data: Lung cancer.  CT CHEST WITH CONTRAST  Technique:  Multidetector CT imaging of the chest was performed following the standard protocol during bolus administration of intravenous contrast.  Contrast: OMNIPAQUE IOHEXOL 300 MG/ML IV SOLN  Comparison: 10/08/2010.  Findings: There are numerous mediastinal lymph nodes, which are sub centimeter in size and stable.  No hilar or axillary adenopathy. There is atherosclerotic calcification of the arterial vasculature, including extensive involvement of the coronary arteries. Ascending aorta measures 4.3 cm.  Pulmonary arteries are enlarged as well.  Heart size within normal limits.  Tiny amount of pericardial fluid is stable and may be physiologic.  Centrilobular emphysema.  Postoperative changes in the right hemithorax, with radioactive seed placement in the anterior right lung.  Subpleural ground-glass in the posterior right lower lobe (image 49) is unchanged from 04/14/2008.  Mild subpleural reticulation in the peripheral left upper lobe. Scattered subpleural scarring.  No pleural fluid.  Airway is unremarkable.  Incidental imaging of the upper abdomen shows low attenuation throughout the visualized portion of the liver.  An 8 mm low attenuation lesion in the left hepatic lobe is unchanged.  Adrenal glands are unremarkable.  Low attenuation lesions in the upper pole right kidney measure up to 2.8 cm, stable.  No worrisome lytic or sclerotic lesions.  IMPRESSION:  1.  Postoperative changes and radioactive seed placement in the right hemithorax without evidence of recurrent or metastatic disease. 2.  Ascending aortic aneurysm with extensive coronary artery calcification. 3.  Pulmonary arterial hypertension. 4.  Hepatic steatosis.  Original Report Authenticated By: Reyes Ivan, M.D.    ASSESSMENT: This is a very pleasant 77 years old white male with history of stage IA non-small cell lung cancer status post wedge resection of  the right upper lobe with seed implants in December of 2008. The patient is feeling fine and he has no evidence for disease recurrence on the recent scan.   PLAN: I would see him back for followup visit in one year with repeat CT scan of the chest without contrast because of his questionable allergy to IV dye.  All questions were answered. The patient knows to call the clinic with any problems, questions or concerns. We can certainly see the patient much sooner if necessary.

## 2011-11-07 DIAGNOSIS — Z85118 Personal history of other malignant neoplasm of bronchus and lung: Secondary | ICD-10-CM | POA: Insufficient documentation

## 2011-11-08 ENCOUNTER — Encounter: Payer: Self-pay | Admitting: Thoracic Surgery

## 2011-11-08 ENCOUNTER — Ambulatory Visit (INDEPENDENT_AMBULATORY_CARE_PROVIDER_SITE_OTHER): Payer: Medicare Other | Admitting: Thoracic Surgery

## 2011-11-08 VITALS — BP 136/74 | HR 100 | Resp 20 | Ht 70.0 in | Wt 249.0 lb

## 2011-11-08 DIAGNOSIS — C3490 Malignant neoplasm of unspecified part of unspecified bronchus or lung: Secondary | ICD-10-CM

## 2011-11-08 DIAGNOSIS — Z85118 Personal history of other malignant neoplasm of bronchus and lung: Secondary | ICD-10-CM

## 2011-11-08 NOTE — Progress Notes (Signed)
HPI patient returns for followup now 4-1/2 years since his surgery for a stage IA cancer the right upper lobe. I is a CT scan shows no evidence of recurrence. He'll be followed on in one year by Dr. Gwenyth Bouillon. He's had no previous major medical problems. I will see him again as needed   Current Outpatient Prescriptions  Medication Sig Dispense Refill  . alfuzosin (UROXATRAL) 10 MG 24 hr tablet Take 10 mg by mouth daily.        Marland Kitchen aspirin 81 MG tablet Take 81 mg by mouth daily.        Marland Kitchen co-enzyme Q-10 30 MG capsule Take 30 mg by mouth 3 (three) times daily.        . folic acid (FOLVITE) 1 MG tablet Take 1 mg by mouth daily.        Marland Kitchen glipiZIDE (GLUCOTROL) 5 MG tablet Take 5 mg by mouth 2 (two) times daily before a meal.        . losartan (COZAAR) 50 MG tablet Take 50 mg by mouth daily.        . simvastatin (ZOCOR) 40 MG tablet Take 40 mg by mouth at bedtime.        . sitaGLIPtan-metformin (JANUMET) 50-1000 MG per tablet Take 1 tablet by mouth 2 (two) times daily with a meal.           Review of Systems: Unchanged   Physical Exam lungs are clear to auscultation percussion   Diagnostic Tests: CT scan shows no evidence of recurrence of his cancer   Impression: Stage IA non-small cell lung cancer right upper lobe status post resection and seeds Plan return as needed:

## 2011-12-01 DIAGNOSIS — E119 Type 2 diabetes mellitus without complications: Secondary | ICD-10-CM | POA: Diagnosis not present

## 2011-12-01 DIAGNOSIS — E1149 Type 2 diabetes mellitus with other diabetic neurological complication: Secondary | ICD-10-CM | POA: Diagnosis not present

## 2011-12-06 DIAGNOSIS — D0460 Carcinoma in situ of skin of unspecified upper limb, including shoulder: Secondary | ICD-10-CM | POA: Diagnosis not present

## 2011-12-06 DIAGNOSIS — L820 Inflamed seborrheic keratosis: Secondary | ICD-10-CM | POA: Diagnosis not present

## 2011-12-06 DIAGNOSIS — C4492 Squamous cell carcinoma of skin, unspecified: Secondary | ICD-10-CM | POA: Diagnosis not present

## 2011-12-06 DIAGNOSIS — L259 Unspecified contact dermatitis, unspecified cause: Secondary | ICD-10-CM | POA: Diagnosis not present

## 2012-01-10 DIAGNOSIS — H5200 Hypermetropia, unspecified eye: Secondary | ICD-10-CM | POA: Diagnosis not present

## 2012-01-10 DIAGNOSIS — H2510 Age-related nuclear cataract, unspecified eye: Secondary | ICD-10-CM | POA: Diagnosis not present

## 2012-01-10 DIAGNOSIS — E119 Type 2 diabetes mellitus without complications: Secondary | ICD-10-CM | POA: Diagnosis not present

## 2012-01-10 DIAGNOSIS — H52229 Regular astigmatism, unspecified eye: Secondary | ICD-10-CM | POA: Diagnosis not present

## 2012-02-16 DIAGNOSIS — E119 Type 2 diabetes mellitus without complications: Secondary | ICD-10-CM | POA: Diagnosis not present

## 2012-02-16 DIAGNOSIS — E1149 Type 2 diabetes mellitus with other diabetic neurological complication: Secondary | ICD-10-CM | POA: Diagnosis not present

## 2012-03-13 DIAGNOSIS — L820 Inflamed seborrheic keratosis: Secondary | ICD-10-CM | POA: Diagnosis not present

## 2012-03-13 DIAGNOSIS — L738 Other specified follicular disorders: Secondary | ICD-10-CM | POA: Diagnosis not present

## 2012-03-13 DIAGNOSIS — L570 Actinic keratosis: Secondary | ICD-10-CM | POA: Diagnosis not present

## 2012-03-13 DIAGNOSIS — L719 Rosacea, unspecified: Secondary | ICD-10-CM | POA: Diagnosis not present

## 2012-03-13 DIAGNOSIS — D492 Neoplasm of unspecified behavior of bone, soft tissue, and skin: Secondary | ICD-10-CM | POA: Diagnosis not present

## 2012-03-13 DIAGNOSIS — R233 Spontaneous ecchymoses: Secondary | ICD-10-CM | POA: Diagnosis not present

## 2012-03-15 DIAGNOSIS — E785 Hyperlipidemia, unspecified: Secondary | ICD-10-CM | POA: Diagnosis not present

## 2012-03-15 DIAGNOSIS — Z6836 Body mass index (BMI) 36.0-36.9, adult: Secondary | ICD-10-CM | POA: Diagnosis not present

## 2012-03-15 DIAGNOSIS — E119 Type 2 diabetes mellitus without complications: Secondary | ICD-10-CM | POA: Diagnosis not present

## 2012-03-15 DIAGNOSIS — I10 Essential (primary) hypertension: Secondary | ICD-10-CM | POA: Diagnosis not present

## 2012-04-05 DIAGNOSIS — Z1211 Encounter for screening for malignant neoplasm of colon: Secondary | ICD-10-CM | POA: Diagnosis not present

## 2012-04-05 NOTE — H&P (Signed)
  NTS SOAP Note  Vital Signs:  Vitals as of: 04/05/2012: Systolic 145: Diastolic 81: Heart Rate 88: Temp 97.47F: Height 64ft 9in: Weight 254Lbs 0 Ounces: OFC Not Entered: Respiratory Rate Not Entered: O2 Saturation Not Entered: Pain Level Not Entered: BMI 38  BMI : 37.51 kg/m2  Subjective: This 78 Years 12 Months old Male presents forTCS.  Last had a TCS many years ago.  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  Denies any GI complaints.  No family h/o colon cancer.  Review of Symptoms:  Constitutional:unremarkable Head:unremarkable Eyes:unremarkable sinus Cardiovascular:unremarkable Respiratory:cough Gastrointestinal:unremarkable Genitourinary:frequency neck, back, joint pain dry Hematolgic/Lymphatic:unremarkable Allergic/Immunologic:unremarkable   Past Medical History:Reviewed   Past Medical History  Surgical History: TCS in remote past, cholecystectomy, lithotrysy, neck, lung cancer surgery Medical Problems:  Cancer-lung/colon, Diabetes, High Blood pressure, High cholesterol Medications: sulfa Immunizations: simvastatin, uroxatral, losartan, glipizide, vit d, baby asa   Social History:Reviewed  Social History  Preferred Language: English ([place]) Race:  White Ethnicity: Not Hispanic / Latino Age: 78 Years 8 Months Marital Status:  M Alcohol: socially Recreational drug(s):  No   Smoking Status: Former smoker reviewed on 04/05/2012 Started Date: 11/28/1946 Stopped Date: 11/28/1965   Family History:Reviewed   Family History  Is there a family history AV:[id number]    Objective Information: General:Well appearing, well nourished in no distress. Head:Atraumatic; no masses; no abnormalities Neck:Supple without lymphadenopathy.  Heart:RRR, no murmur or gallop.  Normal S1, S2.  No S3, S4.  Lungs:CTA bilaterally, no wheezes, rhonchi, rales.  Breathing  unlabored. Abdomen:Soft, NT/ND, no HSM, no masses. deferred to procedure  Assessment:Need for screening TCS  Diagnosis &amp; Procedure: DiagnosisCode: V76.51, ProcedureCode: 95621,    Plan:Scheduled for TCS on 04/17/12.   Patient Education:Alternative treatments to surgery were discussed with patient (and family).Risks and benefits  of procedure were fully explained to the patient (and family) who gave informed consent. Patient/family questions were addressed.  Follow-up:Pending Surgery

## 2012-04-10 ENCOUNTER — Encounter (HOSPITAL_COMMUNITY): Payer: Self-pay | Admitting: Pharmacy Technician

## 2012-04-17 ENCOUNTER — Encounter (HOSPITAL_COMMUNITY)
Admission: RE | Disposition: A | Discharge status: Home / Self Care | Payer: Self-pay | Source: Ambulatory Visit | Attending: General Surgery

## 2012-04-17 ENCOUNTER — Ambulatory Visit (HOSPITAL_COMMUNITY)
Admission: RE | Admit: 2012-04-17 | Discharge: 2012-04-17 | Disposition: A | Discharge status: Home / Self Care | Payer: Medicare Other | Source: Ambulatory Visit | Attending: General Surgery | Admitting: General Surgery

## 2012-04-17 ENCOUNTER — Encounter (HOSPITAL_COMMUNITY): Payer: Self-pay | Admitting: *Deleted

## 2012-04-17 DIAGNOSIS — Z85118 Personal history of other malignant neoplasm of bronchus and lung: Secondary | ICD-10-CM | POA: Diagnosis not present

## 2012-04-17 DIAGNOSIS — E7800 Pure hypercholesterolemia, unspecified: Secondary | ICD-10-CM | POA: Diagnosis not present

## 2012-04-17 DIAGNOSIS — E119 Type 2 diabetes mellitus without complications: Secondary | ICD-10-CM | POA: Diagnosis not present

## 2012-04-17 DIAGNOSIS — Z85038 Personal history of other malignant neoplasm of large intestine: Secondary | ICD-10-CM | POA: Insufficient documentation

## 2012-04-17 DIAGNOSIS — I10 Essential (primary) hypertension: Secondary | ICD-10-CM | POA: Diagnosis not present

## 2012-04-17 DIAGNOSIS — Z01812 Encounter for preprocedural laboratory examination: Secondary | ICD-10-CM | POA: Diagnosis not present

## 2012-04-17 DIAGNOSIS — D126 Benign neoplasm of colon, unspecified: Secondary | ICD-10-CM | POA: Insufficient documentation

## 2012-04-17 DIAGNOSIS — Z1211 Encounter for screening for malignant neoplasm of colon: Secondary | ICD-10-CM | POA: Diagnosis not present

## 2012-04-17 HISTORY — DX: Polyp of colon: K63.5

## 2012-04-17 HISTORY — DX: Benign prostatic hyperplasia without lower urinary tract symptoms: N40.0

## 2012-04-17 HISTORY — PX: COLONOSCOPY: SHX5424

## 2012-04-17 LAB — GLUCOSE, CAPILLARY

## 2012-04-17 SURGERY — COLONOSCOPY
Anesthesia: Moderate Sedation

## 2012-04-17 MED ORDER — MEPERIDINE HCL 100 MG/ML IJ SOLN
INTRAMUSCULAR | Status: AC
  Filled 2012-04-17: qty 1

## 2012-04-17 MED ORDER — MIDAZOLAM HCL 5 MG/5ML IJ SOLN
INTRAMUSCULAR | Status: DC | PRN
  Administered 2012-04-17: 4 mg via INTRAVENOUS

## 2012-04-17 MED ORDER — SODIUM CHLORIDE 0.45 % IV SOLN
Freq: Once | INTRAVENOUS | Status: AC
  Administered 2012-04-17: 08:00:00 via INTRAVENOUS

## 2012-04-17 MED ORDER — MIDAZOLAM HCL 5 MG/5ML IJ SOLN
INTRAMUSCULAR | Status: AC
  Filled 2012-04-17: qty 5

## 2012-04-17 MED ORDER — STERILE WATER FOR IRRIGATION IR SOLN
Status: DC | PRN
  Administered 2012-04-17: 09:00:00

## 2012-04-17 MED ORDER — MEPERIDINE HCL 25 MG/ML IJ SOLN
INTRAMUSCULAR | Status: DC | PRN
  Administered 2012-04-17: 50 mg via INTRAVENOUS

## 2012-04-17 NOTE — Interval H&P Note (Signed)
History and Physical Interval Note:  04/17/2012 8:38 AM  [name]  has presented today for surgery, with the diagnosis of Special screening for malignant neoplasms, colon  The various methods of treatment have been discussed with the patient and family. After consideration of risks, benefits and other options for treatment, the patient has consented to  Procedure(s) (LRB): COLONOSCOPY (N/A) as a surgical intervention .  The patients' history has been reviewed, patient examined, no change in status, stable for surgery.  I have reviewed the patients' chart and labs.  Questions were answered to the patient's satisfaction.     Franky Macho A

## 2012-04-17 NOTE — Op Note (Signed)
[facility] [address] [place], [place]  [address]  COLONOSCOPY PROCEDURE REPORT  PATIENT:  [name], [name]  MR#:  [id number] BIRTHDATE:  04/10/1927, 84 yrs. old  GENDER:  male ENDOSCOPIST:  Franky Macho, MD REF. BY:  Assunta Found, M.D. PROCEDURE DATE:  04/17/2012 PROCEDURE:  Colonoscopy with snare polypectomy ASA CLASS:  Class III INDICATIONS:  Screening MEDICATIONS:   Versed 4 mg IV, demerol 50 mg IV  DESCRIPTION OF PROCEDURE:   After the risks benefits and alternatives of the procedure were thoroughly explained, informed consent was obtained.  Digital rectal exam was performed and revealed no abnormalities.   The EC-[id number] ([id number]) endoscope was introduced through the anus and advanced to the cecum, which was identified by the ileocecal valve, without limitations.  The quality of the prep was adequate..  The instrument was then slowly withdrawn as the colon was fully examined. <<PROCEDUREIMAGES>>  FINDINGS:  A lipoma was found at the hepatic flexure (see image001, image002, image003, and image006).  A sessile polyp was found at the hepatic flexure.  Removedusing snare cautery and sent to pathology for further examination. A sessile polyp was found in the sigmoid colon, 30cm from the anus.  Removed using snare cautery and sent to pathology.  Another small polyp in the vicinity was fulgerated.   Retroflexed views in the rectum revealed no abnormalities.  The scope was then withdrawn from the cecum and the procedure completed. COMPLICATIONS:  None ENDOSCOPIC IMPRESSION: 1) Lipoma at the hepatic flexure 2) Sessile polyp at the hepatic flexure 3) Sessile polyp in the sigmoid colon RECOMMENDATIONS: 1) Await pathology results REPEAT EXAM:  In 3 year(s) for Colonoscopy.  ______________________________ Franky Macho, MD  CC:  Assunta Found, MD  n. Rosalie DoctorFranky Macho at 04/17/2012 09:04 AM  Rickey Primus, [phone]

## 2012-04-17 NOTE — Discharge Instructions (Signed)
Colonoscopy Care After Read the instructions outlined below and refer to this sheet in the next few weeks. These discharge instructions provide you with general information on caring for yourself after you leave the hospital. Your doctor may also give you specific instructions. While your treatment has been planned according to the most current medical practices available, unavoidable complications occasionally occur. If you have any problems or questions after discharge, call your doctor. HOME CARE INSTRUCTIONS ACTIVITY:  You may resume your regular activity, but move at a slower pace for the next 24 hours.   Take frequent rest periods for the next 24 hours.   Walking will help get rid of the air and reduce the bloated feeling in your belly (abdomen).   No driving for 24 hours (because of the medicine (anesthesia) used during the test).   You may shower.   Do not sign any important legal documents or operate any machinery for 24 hours (because of the anesthesia used during the test).  NUTRITION:  Drink plenty of fluids.   You may resume your normal diet as instructed by your doctor.   Begin with a light meal and progress to your normal diet. Heavy or fried foods are harder to digest and may make you feel sick to your stomach (nauseated).   Avoid alcoholic beverages for 24 hours or as instructed.  MEDICATIONS:  You may resume your normal medications unless your doctor tells you otherwise.  WHAT TO EXPECT TODAY:  Some feelings of bloating in the abdomen.   Passage of more gas than usual.   Spotting of blood in your stool or on the toilet paper.  IF YOU HAD POLYPS REMOVED DURING THE COLONOSCOPY:  No aspirin products for 7 days or as instructed.   No alcohol for 7 days or as instructed.   Eat a soft diet for the next 24 hours.  FINDING OUT THE RESULTS OF YOUR TEST Not all test results are available during your visit. If your test results are not back during the visit, make an  appointment with your caregiver to find out the results. Do not assume everything is normal if you have not heard from your caregiver or the medical facility. It is important for you to follow up on all of your test results.  SEEK IMMEDIATE MEDICAL CARE IF:  You have more than a spotting of blood in your stool.   Your belly is swollen (abdominal distention).   You are nauseated or vomiting.   You have a fever.   You have abdominal pain or discomfort that is severe or gets worse throughout the day.  Document Released: 06/28/2004 Document Revised: 11/03/2011 Document Reviewed: 06/26/2008 Exit[employer] Patient Information 2012 [employer]. 

## 2012-04-19 ENCOUNTER — Encounter (HOSPITAL_COMMUNITY): Payer: Self-pay | Admitting: General Surgery

## 2012-04-26 DIAGNOSIS — E1149 Type 2 diabetes mellitus with other diabetic neurological complication: Secondary | ICD-10-CM | POA: Diagnosis not present

## 2012-04-26 DIAGNOSIS — E119 Type 2 diabetes mellitus without complications: Secondary | ICD-10-CM | POA: Diagnosis not present

## 2012-06-18 DIAGNOSIS — N400 Enlarged prostate without lower urinary tract symptoms: Secondary | ICD-10-CM | POA: Diagnosis not present

## 2012-06-18 DIAGNOSIS — N320 Bladder-neck obstruction: Secondary | ICD-10-CM | POA: Diagnosis not present

## 2012-06-18 DIAGNOSIS — N200 Calculus of kidney: Secondary | ICD-10-CM | POA: Diagnosis not present

## 2012-07-12 DIAGNOSIS — E119 Type 2 diabetes mellitus without complications: Secondary | ICD-10-CM | POA: Diagnosis not present

## 2012-07-12 DIAGNOSIS — E1149 Type 2 diabetes mellitus with other diabetic neurological complication: Secondary | ICD-10-CM | POA: Diagnosis not present

## 2012-07-16 DIAGNOSIS — H903 Sensorineural hearing loss, bilateral: Secondary | ICD-10-CM | POA: Diagnosis not present

## 2012-07-19 DIAGNOSIS — I10 Essential (primary) hypertension: Secondary | ICD-10-CM | POA: Diagnosis not present

## 2012-07-19 DIAGNOSIS — Z6834 Body mass index (BMI) 34.0-34.9, adult: Secondary | ICD-10-CM | POA: Diagnosis not present

## 2012-07-19 DIAGNOSIS — E785 Hyperlipidemia, unspecified: Secondary | ICD-10-CM | POA: Diagnosis not present

## 2012-07-19 DIAGNOSIS — Z7182 Exercise counseling: Secondary | ICD-10-CM | POA: Diagnosis not present

## 2012-08-10 DIAGNOSIS — Z23 Encounter for immunization: Secondary | ICD-10-CM | POA: Diagnosis not present

## 2012-09-12 DIAGNOSIS — Z6836 Body mass index (BMI) 36.0-36.9, adult: Secondary | ICD-10-CM | POA: Diagnosis not present

## 2012-09-12 DIAGNOSIS — M542 Cervicalgia: Secondary | ICD-10-CM | POA: Diagnosis not present

## 2012-09-12 DIAGNOSIS — IMO0002 Reserved for concepts with insufficient information to code with codable children: Secondary | ICD-10-CM | POA: Diagnosis not present

## 2012-09-20 DIAGNOSIS — E1149 Type 2 diabetes mellitus with other diabetic neurological complication: Secondary | ICD-10-CM | POA: Diagnosis not present

## 2012-09-20 DIAGNOSIS — L719 Rosacea, unspecified: Secondary | ICD-10-CM | POA: Diagnosis not present

## 2012-09-20 DIAGNOSIS — L570 Actinic keratosis: Secondary | ICD-10-CM | POA: Diagnosis not present

## 2012-09-20 DIAGNOSIS — L819 Disorder of pigmentation, unspecified: Secondary | ICD-10-CM | POA: Diagnosis not present

## 2012-09-20 DIAGNOSIS — E119 Type 2 diabetes mellitus without complications: Secondary | ICD-10-CM | POA: Diagnosis not present

## 2012-09-20 DIAGNOSIS — D1739 Benign lipomatous neoplasm of skin and subcutaneous tissue of other sites: Secondary | ICD-10-CM | POA: Diagnosis not present

## 2012-10-29 ENCOUNTER — Other Ambulatory Visit: Payer: Medicare Other | Admitting: Lab

## 2012-10-30 ENCOUNTER — Ambulatory Visit (HOSPITAL_COMMUNITY)
Admission: RE | Admit: 2012-10-30 | Discharge: 2012-10-30 | Disposition: A | Discharge status: Home / Self Care | Payer: Medicare Other | Source: Ambulatory Visit | Attending: Internal Medicine | Admitting: Internal Medicine

## 2012-10-30 ENCOUNTER — Other Ambulatory Visit (HOSPITAL_BASED_OUTPATIENT_CLINIC_OR_DEPARTMENT_OTHER): Payer: Medicare Other | Admitting: Lab

## 2012-10-30 ENCOUNTER — Encounter (HOSPITAL_COMMUNITY): Payer: Self-pay

## 2012-10-30 DIAGNOSIS — I2510 Atherosclerotic heart disease of native coronary artery without angina pectoris: Secondary | ICD-10-CM | POA: Insufficient documentation

## 2012-10-30 DIAGNOSIS — C3490 Malignant neoplasm of unspecified part of unspecified bronchus or lung: Secondary | ICD-10-CM | POA: Insufficient documentation

## 2012-10-30 DIAGNOSIS — I2584 Coronary atherosclerosis due to calcified coronary lesion: Secondary | ICD-10-CM | POA: Insufficient documentation

## 2012-10-30 DIAGNOSIS — C3410 Malignant neoplasm of upper lobe, unspecified bronchus or lung: Secondary | ICD-10-CM | POA: Diagnosis not present

## 2012-10-30 LAB — CBC WITH DIFFERENTIAL/PLATELET
Basophils Absolute: 0 10*3/uL (ref 0.0–0.1)
Eosinophils Absolute: 0.1 10*3/uL (ref 0.0–0.5)
HCT: 39.7 % (ref 38.4–49.9)
LYMPH%: 25.7 % (ref 14.0–49.0)
MCV: 83.1 fL (ref 79.3–98.0)
MONO#: 0.5 10*3/uL (ref 0.1–0.9)
NEUT#: 5.1 10*3/uL (ref 1.5–6.5)
NEUT%: 66 % (ref 39.0–75.0)
Platelets: 184 10*3/uL (ref 140–400)
WBC: 7.7 10*3/uL (ref 4.0–10.3)

## 2012-10-30 LAB — COMPREHENSIVE METABOLIC PANEL (CC13)
Alkaline Phosphatase: 51 U/L (ref 40–150)
CO2: 25 mEq/L (ref 22–29)
Creatinine: 0.8 mg/dL (ref 0.7–1.3)
Glucose: 184 mg/dl — ABNORMAL HIGH (ref 70–99)
Total Bilirubin: 0.42 mg/dL (ref 0.20–1.20)

## 2012-10-31 ENCOUNTER — Encounter: Payer: Self-pay | Admitting: *Deleted

## 2012-10-31 ENCOUNTER — Ambulatory Visit (HOSPITAL_BASED_OUTPATIENT_CLINIC_OR_DEPARTMENT_OTHER): Payer: Medicare Other | Admitting: Internal Medicine

## 2012-10-31 ENCOUNTER — Telehealth: Payer: Self-pay | Admitting: Internal Medicine

## 2012-10-31 VITALS — BP 114/60 | HR 94 | Temp 97.5°F | Resp 20 | Ht 69.5 in | Wt 251.6 lb

## 2012-10-31 DIAGNOSIS — Z85118 Personal history of other malignant neoplasm of bronchus and lung: Secondary | ICD-10-CM | POA: Diagnosis not present

## 2012-10-31 NOTE — Patient Instructions (Signed)
Your scan showed no evidence for disease recurrence. Followup visit with repeat CT scan of the chest without contrast in one year.

## 2012-10-31 NOTE — Progress Notes (Signed)
[facility] [phone]   [phone]  OFFICE PROGRESS NOTE  Colette Ribas, MD [address] [place] [address]  DIAGNOSIS: Stage IA non-small cell lung cancer, moderately differentiated adenocarcinoma, diagnosed in April 2008.   PRIOR THERAPY: Status post wedge resection of the right upper lobe with seed implants under the care of Dr Edwyna Shell on October 13, 2007.   CURRENT THERAPY: Observation.  INTERVAL HISTORY: [name] 78 y.o. male returns to the clinic today for annual followup visit. The patient is feeling fine today with no specific complaints. He denied having any significant chest pain, shortness breath, cough or hemoptysis. He denied having any significant weight loss or night sweats. The patient had repeat CT scan of the chest performed recently and he is here today for evaluation and discussion of his scan results.  MEDICAL HISTORY: Past Medical History  Diagnosis Date  . Diabetes mellitus   . Hypertension   . Hypercholesteremia   . History of kidney stones   . Tobacco abuse   . lung ca dx'd 01/2007    seed implants  . Enlarged prostate   . Colon polyps     ALLERGIES:  is allergic to sulfa antibiotics.  MEDICATIONS:  Current Outpatient Prescriptions  Medication Sig Dispense Refill  . alfuzosin (UROXATRAL) 10 MG 24 hr tablet Take 10 mg by mouth daily.        Marland Kitchen aspirin 81 MG tablet Take 81 mg by mouth daily.        . Cholecalciferol (VITAMIN D3) 2000 UNITS capsule Take 2,000 Units by mouth daily.      . folic acid (FOLVITE) 800 MCG tablet Take 800 mcg by mouth daily.      Marland Kitchen losartan (COZAAR) 100 MG tablet Take 100 mg by mouth daily.      . simvastatin (ZOCOR) 40 MG tablet Take 40 mg by mouth at bedtime.        . sitaGLIPtan-metformin (JANUMET) 50-1000 MG per tablet Take 1 tablet by mouth 2 (two) times daily with a meal.          SURGICAL HISTORY:  Past Surgical History  Procedure  Date  . Wedge resection with seed implantation and node sampling with right vats 03/13/2007  . Cervical spine surgery   . Appendectomy   . Cholecystectomy   . Tonsillectomy   . Colonoscopy w/ polypectomy   . Colonoscopy 04/17/2012    Procedure: COLONOSCOPY;  Surgeon: Dalia Heading, MD;  Location[facility];  Service: Gastroenterology;  Laterality: N/A;    REVIEW OF SYSTEMS:  A comprehensive review of systems was negative.   PHYSICAL EXAMINATION: General appearance: alert Head: Normocephalic, without obvious abnormality, atraumatic Neck: no adenopathy Resp: clear to auscultation bilaterally Cardio: regular rate and rhythm, S1, S2 normal, no murmur, click, rub or gallop GI: soft, non-tender; bowel sounds normal; no masses,  no organomegaly Extremities: extremities normal, atraumatic, no cyanosis or edema  ECOG PERFORMANCE STATUS: 1 - Symptomatic but completely ambulatory  Blood pressure 114/60, pulse 94, temperature 97.5 F (36.4 C), temperature source Oral, resp. rate 20, height 5' 9.5" (1.765 m), weight 251 lb 9.6 oz (114.125 kg).  LABORATORY DATA: Lab Results  Component Value Date   WBC 7.7 10/30/2012   HGB 13.1 10/30/2012   HCT 39.7 10/30/2012   MCV 83.1 10/30/2012   PLT 184 10/30/2012      Chemistry      Component Value Date/Time  NA 139 10/30/2012 0805   NA 144 10/10/2011 0733   NA 141 10/08/2010 0929   K 4.2 10/30/2012 0805   K 3.9 10/10/2011 0733   K 4.2 10/08/2010 0929   CL 104 10/30/2012 0805   CL 100 10/10/2011 0733   CL 106 10/08/2010 0929   CO2 25 10/30/2012 0805   CO2 28 10/10/2011 0733   CO2 26 10/08/2010 0929   BUN 22.0 10/30/2012 0805   BUN 19 10/10/2011 0733   BUN 19 10/08/2010 0929   CREATININE 0.8 10/30/2012 0805   CREATININE 0.6 10/10/2011 0733   CREATININE 0.84 10/08/2010 0929      Component Value Date/Time   CALCIUM 10.5* 10/30/2012 0805   CALCIUM 10.0 10/10/2011 0733   CALCIUM 10.2 10/08/2010 0929   ALKPHOS 51 10/30/2012 0805   ALKPHOS 50  10/10/2011 0733   ALKPHOS 48 10/08/2010 0929   AST 18 10/30/2012 0805   AST 19 10/10/2011 0733   AST 21 10/08/2010 0929   ALT 30 10/30/2012 0805   ALT 24 10/08/2010 0929   BILITOT 0.42 10/30/2012 0805   BILITOT 0.50 10/10/2011 0733   BILITOT 0.5 10/08/2010 0929       RADIOGRAPHIC STUDIES: Ct Chest Wo Contrast  10/30/2012  *RADIOLOGY REPORT*  Clinical Data: Lung cancer restaging  CT CHEST WITHOUT CONTRAST  Technique:  Multidetector CT imaging of the chest was performed following the standard protocol without IV contrast.  Comparison: 10/10/2011  Findings:  Lungs/pleura: No pleural effusions identified.  Stable appearance of anterior right upper lobe postoperative change with placement of radioactive seed implants. There are no specific features identified to suggest residual or recurrence of tumor.  Subpleural nodule within the right midlung measures 5 mm, image 29 and is stable from previous study.  Heart/Mediastinum: Multiple small mediastinal lymph nodes are identified.  No metastatic adenopathy noted.  Prominent coronary artery calcifications involving the RCA, LAD and left circumflex coronary arteries noted.  The heart size is normal.  No pericardial effusion.  Upper abdomen: Right renal cysts are again noted.  Bones/Musculoskeletal:  Thoracic spondylosis identified.  No worrisome lytic or sclerotic bone lesions.  IMPRESSION:  1.  No acute findings.  No evidence for metastatic disease. 2.  Stable postoperative changes with radioactive seed implant in the right hemithorax. 3.  Prominent coronary artery calcifications.   Original Report Authenticated By: Signa Kell, M.D.     ASSESSMENT: This is a very pleasant 78 years old white male with history of stage IA non-small cell lung cancer status post wedge resection of the right upper lobe with seed implants in November of 2008 and the patient has been observation since that time. He has no evidence for disease recurrence on the recent  scan.  PLAN: I discussed the scan results with the patient today. I recommended for him to continue on observation for now with repeat CT scan of the chest without contrast in one year.  He was advised to call immediately if he has any concerning symptoms in the interval.  All questions were answered. The patient knows to call the clinic with any problems, questions or concerns. We can certainly see the patient much sooner if necessary.

## 2012-10-31 NOTE — Telephone Encounter (Signed)
appts made and printed for pt pt aware that he wil;l get a call/letter with scan appt

## 2012-12-24 DIAGNOSIS — N400 Enlarged prostate without lower urinary tract symptoms: Secondary | ICD-10-CM | POA: Diagnosis not present

## 2012-12-24 DIAGNOSIS — E119 Type 2 diabetes mellitus without complications: Secondary | ICD-10-CM | POA: Diagnosis not present

## 2012-12-24 DIAGNOSIS — N320 Bladder-neck obstruction: Secondary | ICD-10-CM | POA: Diagnosis not present

## 2012-12-24 DIAGNOSIS — R3915 Urgency of urination: Secondary | ICD-10-CM | POA: Diagnosis not present

## 2012-12-26 DIAGNOSIS — I10 Essential (primary) hypertension: Secondary | ICD-10-CM | POA: Diagnosis not present

## 2012-12-26 DIAGNOSIS — E119 Type 2 diabetes mellitus without complications: Secondary | ICD-10-CM | POA: Diagnosis not present

## 2012-12-26 DIAGNOSIS — Z6836 Body mass index (BMI) 36.0-36.9, adult: Secondary | ICD-10-CM | POA: Diagnosis not present

## 2012-12-26 DIAGNOSIS — E785 Hyperlipidemia, unspecified: Secondary | ICD-10-CM | POA: Diagnosis not present

## 2013-01-03 DIAGNOSIS — E119 Type 2 diabetes mellitus without complications: Secondary | ICD-10-CM | POA: Diagnosis not present

## 2013-01-03 DIAGNOSIS — E1149 Type 2 diabetes mellitus with other diabetic neurological complication: Secondary | ICD-10-CM | POA: Diagnosis not present

## 2013-01-17 DIAGNOSIS — H52229 Regular astigmatism, unspecified eye: Secondary | ICD-10-CM | POA: Diagnosis not present

## 2013-03-07 DIAGNOSIS — E119 Type 2 diabetes mellitus without complications: Secondary | ICD-10-CM | POA: Diagnosis not present

## 2013-03-07 DIAGNOSIS — E1149 Type 2 diabetes mellitus with other diabetic neurological complication: Secondary | ICD-10-CM | POA: Diagnosis not present

## 2013-03-26 DIAGNOSIS — L819 Disorder of pigmentation, unspecified: Secondary | ICD-10-CM | POA: Diagnosis not present

## 2013-03-26 DIAGNOSIS — D045 Carcinoma in situ of skin of trunk: Secondary | ICD-10-CM | POA: Diagnosis not present

## 2013-03-26 DIAGNOSIS — L570 Actinic keratosis: Secondary | ICD-10-CM | POA: Diagnosis not present

## 2013-03-26 DIAGNOSIS — L738 Other specified follicular disorders: Secondary | ICD-10-CM | POA: Diagnosis not present

## 2013-03-26 DIAGNOSIS — D485 Neoplasm of uncertain behavior of skin: Secondary | ICD-10-CM | POA: Diagnosis not present

## 2013-04-02 DIAGNOSIS — J019 Acute sinusitis, unspecified: Secondary | ICD-10-CM | POA: Diagnosis not present

## 2013-04-02 DIAGNOSIS — Z7182 Exercise counseling: Secondary | ICD-10-CM | POA: Diagnosis not present

## 2013-04-02 DIAGNOSIS — Z713 Dietary counseling and surveillance: Secondary | ICD-10-CM | POA: Diagnosis not present

## 2013-04-02 DIAGNOSIS — R05 Cough: Secondary | ICD-10-CM | POA: Diagnosis not present

## 2013-04-02 DIAGNOSIS — Z6834 Body mass index (BMI) 34.0-34.9, adult: Secondary | ICD-10-CM | POA: Diagnosis not present

## 2013-04-16 DIAGNOSIS — D045 Carcinoma in situ of skin of trunk: Secondary | ICD-10-CM | POA: Diagnosis not present

## 2013-05-16 DIAGNOSIS — E119 Type 2 diabetes mellitus without complications: Secondary | ICD-10-CM | POA: Diagnosis not present

## 2013-05-16 DIAGNOSIS — E1149 Type 2 diabetes mellitus with other diabetic neurological complication: Secondary | ICD-10-CM | POA: Diagnosis not present

## 2013-07-01 DIAGNOSIS — N400 Enlarged prostate without lower urinary tract symptoms: Secondary | ICD-10-CM | POA: Diagnosis not present

## 2013-07-01 DIAGNOSIS — N320 Bladder-neck obstruction: Secondary | ICD-10-CM | POA: Diagnosis not present

## 2013-07-01 DIAGNOSIS — E119 Type 2 diabetes mellitus without complications: Secondary | ICD-10-CM | POA: Diagnosis not present

## 2013-07-01 DIAGNOSIS — N3941 Urge incontinence: Secondary | ICD-10-CM | POA: Diagnosis not present

## 2013-07-03 DIAGNOSIS — L570 Actinic keratosis: Secondary | ICD-10-CM | POA: Diagnosis not present

## 2013-07-03 DIAGNOSIS — D485 Neoplasm of uncertain behavior of skin: Secondary | ICD-10-CM | POA: Diagnosis not present

## 2013-07-03 DIAGNOSIS — D045 Carcinoma in situ of skin of trunk: Secondary | ICD-10-CM | POA: Diagnosis not present

## 2013-07-03 DIAGNOSIS — L821 Other seborrheic keratosis: Secondary | ICD-10-CM | POA: Diagnosis not present

## 2013-07-03 DIAGNOSIS — L819 Disorder of pigmentation, unspecified: Secondary | ICD-10-CM | POA: Diagnosis not present

## 2013-07-03 DIAGNOSIS — C44519 Basal cell carcinoma of skin of other part of trunk: Secondary | ICD-10-CM | POA: Diagnosis not present

## 2013-07-11 DIAGNOSIS — C44519 Basal cell carcinoma of skin of other part of trunk: Secondary | ICD-10-CM | POA: Diagnosis not present

## 2013-07-11 DIAGNOSIS — D045 Carcinoma in situ of skin of trunk: Secondary | ICD-10-CM | POA: Diagnosis not present

## 2013-07-25 DIAGNOSIS — E1149 Type 2 diabetes mellitus with other diabetic neurological complication: Secondary | ICD-10-CM | POA: Diagnosis not present

## 2013-07-25 DIAGNOSIS — E119 Type 2 diabetes mellitus without complications: Secondary | ICD-10-CM | POA: Diagnosis not present

## 2013-07-31 DIAGNOSIS — B354 Tinea corporis: Secondary | ICD-10-CM | POA: Diagnosis not present

## 2013-07-31 DIAGNOSIS — Z6835 Body mass index (BMI) 35.0-35.9, adult: Secondary | ICD-10-CM | POA: Diagnosis not present

## 2013-08-23 DIAGNOSIS — Z23 Encounter for immunization: Secondary | ICD-10-CM | POA: Diagnosis not present

## 2013-09-11 DIAGNOSIS — L819 Disorder of pigmentation, unspecified: Secondary | ICD-10-CM | POA: Diagnosis not present

## 2013-09-11 DIAGNOSIS — L570 Actinic keratosis: Secondary | ICD-10-CM | POA: Diagnosis not present

## 2013-09-11 DIAGNOSIS — D1739 Benign lipomatous neoplasm of skin and subcutaneous tissue of other sites: Secondary | ICD-10-CM | POA: Diagnosis not present

## 2013-10-17 DIAGNOSIS — E1149 Type 2 diabetes mellitus with other diabetic neurological complication: Secondary | ICD-10-CM | POA: Diagnosis not present

## 2013-10-17 DIAGNOSIS — E119 Type 2 diabetes mellitus without complications: Secondary | ICD-10-CM | POA: Diagnosis not present

## 2013-10-29 ENCOUNTER — Ambulatory Visit (HOSPITAL_COMMUNITY)
Admission: RE | Admit: 2013-10-29 | Discharge: 2013-10-29 | Disposition: A | Discharge status: Home / Self Care | Payer: Medicare Other | Source: Ambulatory Visit | Attending: Internal Medicine | Admitting: Internal Medicine

## 2013-10-29 ENCOUNTER — Other Ambulatory Visit (HOSPITAL_BASED_OUTPATIENT_CLINIC_OR_DEPARTMENT_OTHER): Payer: Medicare Other

## 2013-10-29 DIAGNOSIS — I2510 Atherosclerotic heart disease of native coronary artery without angina pectoris: Secondary | ICD-10-CM | POA: Diagnosis not present

## 2013-10-29 DIAGNOSIS — Z85118 Personal history of other malignant neoplasm of bronchus and lung: Secondary | ICD-10-CM | POA: Diagnosis not present

## 2013-10-29 DIAGNOSIS — C3490 Malignant neoplasm of unspecified part of unspecified bronchus or lung: Secondary | ICD-10-CM | POA: Diagnosis not present

## 2013-10-29 LAB — CBC WITH DIFFERENTIAL/PLATELET
BASO%: 1 % (ref 0.0–2.0)
EOS%: 0.6 % (ref 0.0–7.0)
HCT: 42.6 % (ref 38.4–49.9)
LYMPH%: 24.7 % (ref 14.0–49.0)
MCH: 26 pg — ABNORMAL LOW (ref 27.2–33.4)
MCHC: 31.3 g/dL — ABNORMAL LOW (ref 32.0–36.0)
MCV: 83.1 fL (ref 79.3–98.0)
MONO#: 0.6 10*3/uL (ref 0.1–0.9)
MONO%: 6 % (ref 0.0–14.0)
NEUT%: 67.7 % (ref 39.0–75.0)
Platelets: 197 10*3/uL (ref 140–400)

## 2013-10-29 LAB — COMPREHENSIVE METABOLIC PANEL (CC13)
ALT: 32 U/L (ref 0–55)
Anion Gap: 12 mEq/L — ABNORMAL HIGH (ref 3–11)
CO2: 22 mEq/L (ref 22–29)
Creatinine: 0.8 mg/dL (ref 0.7–1.3)
Total Bilirubin: 0.46 mg/dL (ref 0.20–1.20)

## 2013-10-30 ENCOUNTER — Encounter: Payer: Self-pay | Admitting: Internal Medicine

## 2013-10-30 ENCOUNTER — Ambulatory Visit (HOSPITAL_BASED_OUTPATIENT_CLINIC_OR_DEPARTMENT_OTHER): Payer: Medicare Other | Admitting: Internal Medicine

## 2013-10-30 ENCOUNTER — Telehealth: Payer: Self-pay | Admitting: Internal Medicine

## 2013-10-30 VITALS — BP 158/87 | HR 79 | Temp 97.5°F | Resp 18 | Ht 69.0 in | Wt 252.5 lb

## 2013-10-30 DIAGNOSIS — Z801 Family history of malignant neoplasm of trachea, bronchus and lung: Secondary | ICD-10-CM | POA: Diagnosis not present

## 2013-10-30 DIAGNOSIS — Z85118 Personal history of other malignant neoplasm of bronchus and lung: Secondary | ICD-10-CM

## 2013-10-30 NOTE — Patient Instructions (Signed)
Followup visit in one year with repeat CT scan of the chest. 

## 2013-10-30 NOTE — Telephone Encounter (Signed)
Gave pt appt for lab, Ct and MD on December 2015

## 2013-10-30 NOTE — Progress Notes (Signed)
[facility] [phone]   [phone]  OFFICE PROGRESS NOTE  Andrew Ribas, MD [address] [place] [address]  DIAGNOSIS: Stage IA non-small cell lung cancer, moderately differentiated adenocarcinoma, diagnosed in April 2008.   PRIOR THERAPY: Status post wedge resection of the right upper lobe with seed implants under the care of Dr Edwyna Shell on October 13, 2007.   CURRENT THERAPY: Observation.  INTERVAL HISTORY: Andrew LOUGHRY 79 y.o. male returns to the clinic today for annual followup visit. The patient is feeling fine today with no specific complaints except for mild chest congestion. He denied having any significant chest pain, shortness of breath, cough or hemoptysis. He denied having any significant weight loss or night sweats. The patient had repeat CT scan of the chest performed recently and he is here today for evaluation and discussion of his scan results.  MEDICAL HISTORY: Past Medical History  Diagnosis Date  . Diabetes mellitus   . Hypertension   . Hypercholesteremia   . History of kidney stones   . Tobacco abuse   . lung ca dx'd 01/2007    seed implants  . Enlarged prostate   . Colon polyps     ALLERGIES:  is allergic to sulfa antibiotics.  MEDICATIONS:  Current Outpatient Prescriptions  Medication Sig Dispense Refill  . alfuzosin (UROXATRAL) 10 MG 24 hr tablet Take 10 mg by mouth daily.        Marland Kitchen aspirin 81 MG tablet Take 81 mg by mouth daily.        . Cholecalciferol (VITAMIN D3) 2000 UNITS capsule Take 2,000 Units by mouth daily.      . folic acid (FOLVITE) 800 MCG tablet Take 800 mcg by mouth daily.      Marland Kitchen glipiZIDE (GLUCOTROL) 5 MG tablet Take 5 mg by mouth 2 (two) times daily before a meal.      . losartan (COZAAR) 100 MG tablet Take 100 mg by mouth daily.      . Omega-3 Fatty Acids (FISH OIL) 1000 MG CAPS Take by mouth.      . oxybutynin (DITROPAN-XL) 10 MG 24 hr tablet Take 10 mg  by mouth at bedtime.      . simvastatin (ZOCOR) 40 MG tablet Take 40 mg by mouth at bedtime.         No current facility-administered medications for this visit.    SURGICAL HISTORY:  Past Surgical History  Procedure Laterality Date  . Wedge resection with seed implantation and node sampling with right vats  03/13/2007  . Cervical spine surgery    . Appendectomy    . Cholecystectomy    . Tonsillectomy    . Colonoscopy w/ polypectomy    . Colonoscopy  04/17/2012    Procedure: COLONOSCOPY;  Surgeon: Dalia Heading, MD;  Location: [facility];  Service: Gastroenterology;  Laterality: N/A;    REVIEW OF SYSTEMS:  A comprehensive review of systems was negative.   PHYSICAL EXAMINATION: General appearance: alert Head: Normocephalic, without obvious abnormality, atraumatic Neck: no adenopathy Resp: clear to auscultation bilaterally Cardio: regular rate and rhythm, S1, S2 normal, no murmur, click, rub or gallop GI: soft, non-tender; bowel sounds normal; no masses,  no organomegaly Extremities: extremities normal, atraumatic, no cyanosis or edema  ECOG PERFORMANCE STATUS: 1 - Symptomatic but completely ambulatory  Blood pressure 158/87, pulse 79, temperature 97.5 F (36.4 C), temperature source Oral, resp. rate 18, height 5\' 9"  (  1.753 m), weight 252 lb 8 oz (114.533 kg), SpO2 96.00%.  LABORATORY DATA: Lab Results  Component Value Date   WBC 9.7 10/29/2013   HGB 13.3 10/29/2013   HCT 42.6 10/29/2013   MCV 83.1 10/29/2013   PLT 197 10/29/2013      Chemistry      Component Value Date/Time   NA 140 10/29/2013 0804   NA 144 10/10/2011 0733   NA 141 10/08/2010 0929   K 4.7 10/29/2013 0804   K 3.9 10/10/2011 0733   K 4.2 10/08/2010 0929   CL 104 10/30/2012 0805   CL 100 10/10/2011 0733   CL 106 10/08/2010 0929   CO2 22 10/29/2013 0804   CO2 28 10/10/2011 0733   CO2 26 10/08/2010 0929   BUN 18.0 10/29/2013 0804   BUN 19 10/10/2011 0733   BUN 19 10/08/2010 0929   CREATININE 0.8  10/29/2013 0804   CREATININE 0.6 10/10/2011 0733   CREATININE 0.84 10/08/2010 0929      Component Value Date/Time   CALCIUM 10.3 10/29/2013 0804   CALCIUM 10.0 10/10/2011 0733   CALCIUM 10.2 10/08/2010 0929   ALKPHOS 45 10/29/2013 0804   ALKPHOS 50 10/10/2011 0733   ALKPHOS 48 10/08/2010 0929   AST 21 10/29/2013 0804   AST 19 10/10/2011 0733   AST 21 10/08/2010 0929   ALT 32 10/29/2013 0804   ALT 28 10/10/2011 0733   ALT 24 10/08/2010 0929   BILITOT 0.46 10/29/2013 0804   BILITOT 0.50 10/10/2011 0733   BILITOT 0.5 10/08/2010 0929       RADIOGRAPHIC STUDIES:  Ct Chest Wo Contrast  10/29/2013   CLINICAL DATA:  Lung cancer.  EXAM: CT CHEST WITHOUT CONTRAST  TECHNIQUE: Multidetector CT imaging of the chest was performed following the standard protocol without IV contrast.  COMPARISON:  10/30/2012  FINDINGS: No pleural effusion identified. Post- operative change from partial right upper lobectomy. Seed implants overlying the anterior right lung are again noted. The appearance is unchanged from previous exam. No suspicious pulmonary parenchymal nodule or mass identified within the lungs.  The heart size is normal. There is no pericardial effusion identified. Calcified atherosclerotic disease involves the thoracic aorta, LAD, RCA and left circumflex coronary arteries. Multiple small (less than 1 cm) lymph nodes are identified within the mediastinum and appears similar to previous exam. No adenopathy identified.  There is no axillary or supraclavicular adenopathy. Bilateral gynecomastia is identified. Incidental imaging through the upper abdomen shows no acute findings.  Review of the visualized osseous structures is significant for multilevel spondylosis within the thoracic spine. No aggressive lytic or sclerotic bone lesions identified.  IMPRESSION: 1. No acute findings. 2. No specific features identified to suggest residual or recurrent tumor. 3. Calcified atherosclerotic disease including multi  vessel coronary artery calcifications.   Electronically Signed   By: Signa Kell M.D.   On: 10/29/2013 09:02    ASSESSMENT AND PLAN: This is a very pleasant 79 years old white male with history of stage IA non-small cell lung cancer status post wedge resection of the right upper lobe with seed implants in November of 2008 and the patient has been observation since that time. He has no evidence for disease recurrence on the recent scan. I discussed the scan results with the patient today. I recommended for him to continue on observation for now with repeat CT scan of the chest without contrast in one year.  He was advised to call immediately if he has any concerning symptoms  in the interval.  All questions were answered. The patient knows to call the clinic with any problems, questions or concerns. We can certainly see the patient much sooner if necessary.

## 2013-11-25 ENCOUNTER — Telehealth: Payer: Self-pay | Admitting: Internal Medicine

## 2013-11-25 NOTE — Telephone Encounter (Signed)
gave pt appt for lab and MD on February 2015 °

## 2013-12-30 DIAGNOSIS — R21 Rash and other nonspecific skin eruption: Secondary | ICD-10-CM | POA: Diagnosis not present

## 2013-12-30 DIAGNOSIS — N320 Bladder-neck obstruction: Secondary | ICD-10-CM | POA: Diagnosis not present

## 2013-12-30 DIAGNOSIS — E119 Type 2 diabetes mellitus without complications: Secondary | ICD-10-CM | POA: Diagnosis not present

## 2013-12-30 DIAGNOSIS — N3941 Urge incontinence: Secondary | ICD-10-CM | POA: Diagnosis not present

## 2013-12-30 DIAGNOSIS — N400 Enlarged prostate without lower urinary tract symptoms: Secondary | ICD-10-CM | POA: Diagnosis not present

## 2014-01-02 DIAGNOSIS — E119 Type 2 diabetes mellitus without complications: Secondary | ICD-10-CM | POA: Diagnosis not present

## 2014-01-02 DIAGNOSIS — E1149 Type 2 diabetes mellitus with other diabetic neurological complication: Secondary | ICD-10-CM | POA: Diagnosis not present

## 2014-03-12 DIAGNOSIS — L570 Actinic keratosis: Secondary | ICD-10-CM | POA: Diagnosis not present

## 2014-03-12 DIAGNOSIS — L819 Disorder of pigmentation, unspecified: Secondary | ICD-10-CM | POA: Diagnosis not present

## 2014-03-12 DIAGNOSIS — I789 Disease of capillaries, unspecified: Secondary | ICD-10-CM | POA: Diagnosis not present

## 2014-03-12 DIAGNOSIS — L821 Other seborrheic keratosis: Secondary | ICD-10-CM | POA: Diagnosis not present

## 2014-03-27 DIAGNOSIS — E119 Type 2 diabetes mellitus without complications: Secondary | ICD-10-CM | POA: Diagnosis not present

## 2014-03-27 DIAGNOSIS — E1149 Type 2 diabetes mellitus with other diabetic neurological complication: Secondary | ICD-10-CM | POA: Diagnosis not present

## 2014-04-03 DIAGNOSIS — H903 Sensorineural hearing loss, bilateral: Secondary | ICD-10-CM | POA: Diagnosis not present

## 2014-04-03 DIAGNOSIS — H6120 Impacted cerumen, unspecified ear: Secondary | ICD-10-CM | POA: Diagnosis not present

## 2014-04-11 DIAGNOSIS — H524 Presbyopia: Secondary | ICD-10-CM | POA: Diagnosis not present

## 2014-04-11 DIAGNOSIS — H2589 Other age-related cataract: Secondary | ICD-10-CM | POA: Diagnosis not present

## 2014-04-11 DIAGNOSIS — H52229 Regular astigmatism, unspecified eye: Secondary | ICD-10-CM | POA: Diagnosis not present

## 2014-04-11 DIAGNOSIS — H5200 Hypermetropia, unspecified eye: Secondary | ICD-10-CM | POA: Diagnosis not present

## 2014-06-19 DIAGNOSIS — E119 Type 2 diabetes mellitus without complications: Secondary | ICD-10-CM | POA: Diagnosis not present

## 2014-06-19 DIAGNOSIS — E1149 Type 2 diabetes mellitus with other diabetic neurological complication: Secondary | ICD-10-CM | POA: Diagnosis not present

## 2014-06-30 ENCOUNTER — Other Ambulatory Visit: Payer: Self-pay | Admitting: Urology

## 2014-06-30 ENCOUNTER — Ambulatory Visit
Admission: RE | Admit: 2014-06-30 | Discharge: 2014-06-30 | Disposition: A | Discharge status: Home / Self Care | Payer: Medicare Other | Source: Ambulatory Visit | Attending: Urology | Admitting: Urology

## 2014-06-30 DIAGNOSIS — N200 Calculus of kidney: Secondary | ICD-10-CM | POA: Diagnosis not present

## 2014-06-30 DIAGNOSIS — R3915 Urgency of urination: Secondary | ICD-10-CM | POA: Diagnosis not present

## 2014-06-30 DIAGNOSIS — N400 Enlarged prostate without lower urinary tract symptoms: Secondary | ICD-10-CM | POA: Diagnosis not present

## 2014-06-30 DIAGNOSIS — R319 Hematuria, unspecified: Secondary | ICD-10-CM

## 2014-06-30 DIAGNOSIS — N281 Cyst of kidney, acquired: Secondary | ICD-10-CM | POA: Diagnosis not present

## 2014-07-22 DIAGNOSIS — L678 Other hair color and hair shaft abnormalities: Secondary | ICD-10-CM | POA: Diagnosis not present

## 2014-07-22 DIAGNOSIS — C44319 Basal cell carcinoma of skin of other parts of face: Secondary | ICD-10-CM | POA: Diagnosis not present

## 2014-07-22 DIAGNOSIS — L570 Actinic keratosis: Secondary | ICD-10-CM | POA: Diagnosis not present

## 2014-07-22 DIAGNOSIS — IMO0002 Reserved for concepts with insufficient information to code with codable children: Secondary | ICD-10-CM | POA: Diagnosis not present

## 2014-07-22 DIAGNOSIS — L738 Other specified follicular disorders: Secondary | ICD-10-CM | POA: Diagnosis not present

## 2014-07-22 DIAGNOSIS — D485 Neoplasm of uncertain behavior of skin: Secondary | ICD-10-CM | POA: Diagnosis not present

## 2014-08-08 DIAGNOSIS — H25049 Posterior subcapsular polar age-related cataract, unspecified eye: Secondary | ICD-10-CM | POA: Diagnosis not present

## 2014-08-08 DIAGNOSIS — H2510 Age-related nuclear cataract, unspecified eye: Secondary | ICD-10-CM | POA: Diagnosis not present

## 2014-08-08 DIAGNOSIS — H40019 Open angle with borderline findings, low risk, unspecified eye: Secondary | ICD-10-CM | POA: Diagnosis not present

## 2014-08-14 DIAGNOSIS — Z85828 Personal history of other malignant neoplasm of skin: Secondary | ICD-10-CM | POA: Diagnosis not present

## 2014-08-14 DIAGNOSIS — C44319 Basal cell carcinoma of skin of other parts of face: Secondary | ICD-10-CM | POA: Diagnosis not present

## 2014-08-26 DIAGNOSIS — Z6834 Body mass index (BMI) 34.0-34.9, adult: Secondary | ICD-10-CM | POA: Diagnosis not present

## 2014-08-26 DIAGNOSIS — I10 Essential (primary) hypertension: Secondary | ICD-10-CM | POA: Diagnosis not present

## 2014-08-26 DIAGNOSIS — E119 Type 2 diabetes mellitus without complications: Secondary | ICD-10-CM | POA: Diagnosis not present

## 2014-08-26 DIAGNOSIS — E785 Hyperlipidemia, unspecified: Secondary | ICD-10-CM | POA: Diagnosis not present

## 2014-08-27 DIAGNOSIS — B351 Tinea unguium: Secondary | ICD-10-CM | POA: Diagnosis not present

## 2014-08-27 DIAGNOSIS — E1149 Type 2 diabetes mellitus with other diabetic neurological complication: Secondary | ICD-10-CM | POA: Diagnosis not present

## 2014-08-27 DIAGNOSIS — L609 Nail disorder, unspecified: Secondary | ICD-10-CM | POA: Diagnosis not present

## 2014-08-28 DIAGNOSIS — H2511 Age-related nuclear cataract, right eye: Secondary | ICD-10-CM | POA: Diagnosis not present

## 2014-08-28 DIAGNOSIS — H25011 Cortical age-related cataract, right eye: Secondary | ICD-10-CM | POA: Diagnosis not present

## 2014-08-28 DIAGNOSIS — H25041 Posterior subcapsular polar age-related cataract, right eye: Secondary | ICD-10-CM | POA: Diagnosis not present

## 2014-08-28 DIAGNOSIS — H25811 Combined forms of age-related cataract, right eye: Secondary | ICD-10-CM | POA: Diagnosis not present

## 2014-09-05 DIAGNOSIS — Z23 Encounter for immunization: Secondary | ICD-10-CM | POA: Diagnosis not present

## 2014-09-25 DIAGNOSIS — H25012 Cortical age-related cataract, left eye: Secondary | ICD-10-CM | POA: Diagnosis not present

## 2014-09-25 DIAGNOSIS — H25812 Combined forms of age-related cataract, left eye: Secondary | ICD-10-CM | POA: Diagnosis not present

## 2014-09-25 DIAGNOSIS — H21562 Pupillary abnormality, left eye: Secondary | ICD-10-CM | POA: Diagnosis not present

## 2014-09-25 DIAGNOSIS — H268 Other specified cataract: Secondary | ICD-10-CM | POA: Diagnosis not present

## 2014-09-25 DIAGNOSIS — H2512 Age-related nuclear cataract, left eye: Secondary | ICD-10-CM | POA: Diagnosis not present

## 2014-10-27 DIAGNOSIS — Z6834 Body mass index (BMI) 34.0-34.9, adult: Secondary | ICD-10-CM | POA: Diagnosis not present

## 2014-10-27 DIAGNOSIS — E785 Hyperlipidemia, unspecified: Secondary | ICD-10-CM | POA: Diagnosis not present

## 2014-10-27 DIAGNOSIS — I10 Essential (primary) hypertension: Secondary | ICD-10-CM | POA: Diagnosis not present

## 2014-10-27 DIAGNOSIS — E119 Type 2 diabetes mellitus without complications: Secondary | ICD-10-CM | POA: Diagnosis not present

## 2014-10-28 ENCOUNTER — Other Ambulatory Visit (HOSPITAL_COMMUNITY): Payer: Medicare Other

## 2014-10-29 ENCOUNTER — Encounter (HOSPITAL_COMMUNITY): Payer: Self-pay

## 2014-10-29 ENCOUNTER — Other Ambulatory Visit (HOSPITAL_BASED_OUTPATIENT_CLINIC_OR_DEPARTMENT_OTHER): Payer: Medicare Other

## 2014-10-29 ENCOUNTER — Ambulatory Visit (HOSPITAL_COMMUNITY)
Admission: RE | Admit: 2014-10-29 | Discharge: 2014-10-29 | Disposition: A | Discharge status: Home / Self Care | Payer: Medicare Other | Source: Ambulatory Visit | Attending: Internal Medicine | Admitting: Internal Medicine

## 2014-10-29 DIAGNOSIS — Z85118 Personal history of other malignant neoplasm of bronchus and lung: Secondary | ICD-10-CM

## 2014-10-29 DIAGNOSIS — C3490 Malignant neoplasm of unspecified part of unspecified bronchus or lung: Secondary | ICD-10-CM | POA: Diagnosis not present

## 2014-10-29 LAB — CBC WITH DIFFERENTIAL/PLATELET
BASO%: 0.1 % (ref 0.0–2.0)
BASOS ABS: 0 10*3/uL (ref 0.0–0.1)
EOS%: 0.6 % (ref 0.0–7.0)
Eosinophils Absolute: 0.1 10*3/uL (ref 0.0–0.5)
HEMATOCRIT: 40.8 % (ref 38.4–49.9)
HEMOGLOBIN: 13.2 g/dL (ref 13.0–17.1)
LYMPH#: 2.4 10*3/uL (ref 0.9–3.3)
LYMPH%: 22 % (ref 14.0–49.0)
MCH: 26.6 pg — ABNORMAL LOW (ref 27.2–33.4)
MCHC: 32.4 g/dL (ref 32.0–36.0)
MCV: 82.2 fL (ref 79.3–98.0)
MONO#: 0.6 10*3/uL (ref 0.1–0.9)
MONO%: 5.1 % (ref 0.0–14.0)
NEUT%: 72.2 % (ref 39.0–75.0)
NEUTROS ABS: 8 10*3/uL — AB (ref 1.5–6.5)
Platelets: 219 10*3/uL (ref 140–400)
RBC: 4.97 10*6/uL (ref 4.20–5.82)
RDW: 15.8 % — AB (ref 11.0–14.6)
WBC: 11.1 10*3/uL — AB (ref 4.0–10.3)

## 2014-10-29 LAB — COMPREHENSIVE METABOLIC PANEL (CC13)
ALBUMIN: 3.7 g/dL (ref 3.5–5.0)
ALT: 19 U/L (ref 0–55)
AST: 12 U/L (ref 5–34)
Alkaline Phosphatase: 50 U/L (ref 40–150)
Anion Gap: 10 mEq/L (ref 3–11)
BUN: 22.1 mg/dL (ref 7.0–26.0)
CALCIUM: 10.4 mg/dL (ref 8.4–10.4)
CHLORIDE: 106 meq/L (ref 98–109)
CO2: 22 meq/L (ref 22–29)
CREATININE: 0.8 mg/dL (ref 0.7–1.3)
Glucose: 145 mg/dl — ABNORMAL HIGH (ref 70–140)
Potassium: 4.3 mEq/L (ref 3.5–5.1)
Sodium: 139 mEq/L (ref 136–145)
Total Bilirubin: 0.34 mg/dL (ref 0.20–1.20)
Total Protein: 6.6 g/dL (ref 6.4–8.3)

## 2014-10-30 ENCOUNTER — Ambulatory Visit (HOSPITAL_BASED_OUTPATIENT_CLINIC_OR_DEPARTMENT_OTHER): Payer: Medicare Other | Admitting: Internal Medicine

## 2014-10-30 ENCOUNTER — Encounter: Payer: Self-pay | Admitting: Internal Medicine

## 2014-10-30 VITALS — BP 146/61 | HR 84 | Temp 97.8°F | Resp 18 | Ht 69.0 in | Wt 241.3 lb

## 2014-10-30 DIAGNOSIS — Z85118 Personal history of other malignant neoplasm of bronchus and lung: Secondary | ICD-10-CM | POA: Diagnosis not present

## 2014-10-30 NOTE — Progress Notes (Signed)
[facility] [phone]) [phone]   Fax:(336) [phone]  OFFICE PROGRESS NOTE  Purvis Kilts, MD [address] [place] [address]  DIAGNOSIS: Stage IA non-small cell lung cancer, moderately differentiated adenocarcinoma, diagnosed in April 2008.   PRIOR THERAPY: Status post wedge resection of the right upper lobe with seed implants under the care of Dr Arlyce Dice on October 13, 2007.   CURRENT THERAPY: Observation.  INTERVAL HISTORY: [name] 80 y.o. male returns to the clinic today for annual followup visit. The patient is feeling fine today with no specific complaints. He denied having any significant chest pain, shortness of breath, cough or hemoptysis. He denied having any significant weight loss or night sweats. The patient had repeat CT scan of the chest performed recently and he is here today for evaluation and discussion of his scan results.  MEDICAL HISTORY: Past Medical History  Diagnosis Date  . Diabetes mellitus   . Hypertension   . Hypercholesteremia   . History of kidney stones   . Tobacco abuse   . lung ca dx'd 01/2007    seed implants  . Enlarged prostate   . Colon polyps     ALLERGIES:  is allergic to sulfa antibiotics.  MEDICATIONS:  Current Outpatient Prescriptions  Medication Sig Dispense Refill  . alfuzosin (UROXATRAL) 10 MG 24 hr tablet Take 10 mg by mouth daily.      Marland Kitchen aspirin 81 MG tablet Take 81 mg by mouth daily.      . Cholecalciferol (VITAMIN D3) 2000 UNITS capsule Take 2,000 Units by mouth daily.    . folic acid (FOLVITE) 546 MCG tablet Take 800 mcg by mouth daily.    Marland Kitchen glipiZIDE (GLUCOTROL) 5 MG tablet Take 5 mg by mouth 2 (two) times daily before a meal.    . losartan (COZAAR) 100 MG tablet Take 100 mg by mouth daily.    . Omega-3 Fatty Acids (FISH OIL) 1000 MG CAPS Take by mouth.    . oxybutynin (DITROPAN-XL) 10 MG 24 hr tablet Take 10 mg by mouth at bedtime.    . simvastatin (ZOCOR) 40 MG tablet Take  40 mg by mouth at bedtime.       No current facility-administered medications for this visit.    SURGICAL HISTORY:  Past Surgical History  Procedure Laterality Date  . Wedge resection with seed implantation and node sampling with right vats  03/13/2007  . Cervical spine surgery    . Appendectomy    . Cholecystectomy    . Tonsillectomy    . Colonoscopy w/ polypectomy    . Colonoscopy  04/17/2012    Procedure: COLONOSCOPY;  Surgeon: Jamesetta So, MD;  Location: [facility];  Service: Gastroenterology;  Laterality: N/A;    REVIEW OF SYSTEMS:  A comprehensive review of systems was negative.   PHYSICAL EXAMINATION: General appearance: alert Head: Normocephalic, without obvious abnormality, atraumatic Neck: no adenopathy Resp: clear to auscultation bilaterally Cardio: regular rate and rhythm, S1, S2 normal, no murmur, click, rub or gallop GI: soft, non-tender; bowel sounds normal; no masses,  no organomegaly Extremities: extremities normal, atraumatic, no cyanosis or edema  ECOG PERFORMANCE STATUS: 1 - Symptomatic but completely ambulatory  Blood pressure 146/61, pulse 84, temperature 97.8 F (36.6 C), temperature source Oral, resp. rate 18, height 5\' 9"  (1.753 m), weight 241 lb 4.8 oz (109.453 kg).  LABORATORY DATA: Lab Results  Component Value Date   WBC 11.1* 10/29/2014   HGB 13.2 10/29/2014  HCT 40.8 10/29/2014   MCV 82.2 10/29/2014   PLT 219 10/29/2014      Chemistry      Component Value Date/Time   NA 139 10/29/2014 0759   NA 144 10/10/2011 0733   NA 141 10/08/2010 0929   K 4.3 10/29/2014 0759   K 3.9 10/10/2011 0733   K 4.2 10/08/2010 0929   CL 104 10/30/2012 0805   CL 100 10/10/2011 0733   CL 106 10/08/2010 0929   CO2 22 10/29/2014 0759   CO2 28 10/10/2011 0733   CO2 26 10/08/2010 0929   BUN 22.1 10/29/2014 0759   BUN 19 10/10/2011 0733   BUN 19 10/08/2010 0929   CREATININE 0.8 10/29/2014 0759   CREATININE 0.6 10/10/2011 0733   CREATININE 0.84  10/08/2010 0929      Component Value Date/Time   CALCIUM 10.4 10/29/2014 0759   CALCIUM 10.0 10/10/2011 0733   CALCIUM 10.2 10/08/2010 0929   ALKPHOS 50 10/29/2014 0759   ALKPHOS 50 10/10/2011 0733   ALKPHOS 48 10/08/2010 0929   AST 12 10/29/2014 0759   AST 19 10/10/2011 0733   AST 21 10/08/2010 0929   ALT 19 10/29/2014 0759   ALT 28 10/10/2011 0733   ALT 24 10/08/2010 0929   BILITOT 0.34 10/29/2014 0759   BILITOT 0.50 10/10/2011 0733   BILITOT 0.5 10/08/2010 0929       RADIOGRAPHIC STUDIES: Ct Chest Wo Contrast  10/29/2014   CLINICAL DATA:  Lung cancer.  EXAM: CT CHEST WITHOUT CONTRAST  TECHNIQUE: Multidetector CT imaging of the chest was performed following the standard protocol without IV contrast.  COMPARISON:  10/29/2013.  FINDINGS: Mediastinal lymph nodes are not enlarged by CT size criteria. Hilar regions are difficult to definitively evaluate without IV contrast. No axillary adenopathy. Bilateral gynecomastia. Atherosclerotic calcification of the arterial vasculature, including extensive three-vessel involvement of the coronary arteries. Pulmonary arteries are enlarged. Heart size normal. Small amount of pericardial fluid is stable and may be physiologic.  Postoperative changes of brachytherapy seed implants in the anterior right upper lobe. Mild scattered subpleural scarring. No pleural fluid. Airway is unremarkable.  Incidental imaging of the upper abdomen shows the visualized portions of the liver and adrenal glands to be unremarkable. Low-attenuation lesions in the upper pole right kidney measure up to 3.3 cm, as before. Exophytic low-attenuation lesion off the left kidney is incompletely imaged. Visualized portions of the spleen, pancreas, stomach and bowel are grossly unremarkable. No upper abdominal adenopathy. Midline ventral abdominal wall postoperative changes.  No worrisome lytic or sclerotic lesions. Degenerative changes are seen in the spine. A rounded lucent lesion in  the T8 vertebral body is unchanged.  IMPRESSION: 1. Brachytherapy seeds in the right upper lobe without evidence of recurrent or metastatic disease. 2. Extensive 3 vessel coronary artery calcification. 3. Enlarged pulmonary arteries, indicative of pulmonary arterial hypertension.   Electronically Signed   By: Lorin Picket M.D.   On: 10/29/2014 08:47   ASSESSMENT AND PLAN: This is a very pleasant 80 years old white male with history of stage IA non-small cell lung cancer status post wedge resection of the right upper lobe with seed implants in November of 2008 and the patient has been observation since that time. He has no evidence for disease recurrence on the recent scan. I discussed the scan results with the patient today.  I recommended for the patient the option of coming on an annual basis for the survivorship clinic versus routine follow-up visit and close monitoring  by his primary care physician. The patient referred to have the follow-up done by his primary care physician at this point. He will be seen an as-needed basis at this point. He was advised to call immediately if he has any concerning symptoms.  All questions were answered. The patient knows to call the clinic with any problems, questions or concerns. We can certainly see the patient much sooner if necessary.  Disclaimer: This note was dictated with voice recognition software. Similar sounding words can inadvertently be transcribed and may be missed upon review.

## 2014-11-12 DIAGNOSIS — L609 Nail disorder, unspecified: Secondary | ICD-10-CM | POA: Diagnosis not present

## 2014-11-12 DIAGNOSIS — E1142 Type 2 diabetes mellitus with diabetic polyneuropathy: Secondary | ICD-10-CM | POA: Diagnosis not present

## 2015-01-16 DIAGNOSIS — N200 Calculus of kidney: Secondary | ICD-10-CM | POA: Diagnosis not present

## 2015-01-16 DIAGNOSIS — Q6100 Congenital renal cyst, unspecified: Secondary | ICD-10-CM | POA: Diagnosis not present

## 2015-01-16 DIAGNOSIS — R3915 Urgency of urination: Secondary | ICD-10-CM | POA: Diagnosis not present

## 2015-01-16 DIAGNOSIS — N401 Enlarged prostate with lower urinary tract symptoms: Secondary | ICD-10-CM | POA: Diagnosis not present

## 2015-01-19 DIAGNOSIS — E119 Type 2 diabetes mellitus without complications: Secondary | ICD-10-CM | POA: Diagnosis not present

## 2015-01-19 DIAGNOSIS — Z6834 Body mass index (BMI) 34.0-34.9, adult: Secondary | ICD-10-CM | POA: Diagnosis not present

## 2015-01-19 DIAGNOSIS — Z0000 Encounter for general adult medical examination without abnormal findings: Secondary | ICD-10-CM | POA: Diagnosis not present

## 2015-01-19 DIAGNOSIS — Z86010 Personal history of colonic polyps: Secondary | ICD-10-CM | POA: Diagnosis not present

## 2015-01-19 DIAGNOSIS — E6609 Other obesity due to excess calories: Secondary | ICD-10-CM | POA: Diagnosis not present

## 2015-01-19 DIAGNOSIS — I10 Essential (primary) hypertension: Secondary | ICD-10-CM | POA: Diagnosis not present

## 2015-01-19 DIAGNOSIS — E782 Mixed hyperlipidemia: Secondary | ICD-10-CM | POA: Diagnosis not present

## 2015-01-19 DIAGNOSIS — Z125 Encounter for screening for malignant neoplasm of prostate: Secondary | ICD-10-CM | POA: Diagnosis not present

## 2015-01-27 DIAGNOSIS — E1151 Type 2 diabetes mellitus with diabetic peripheral angiopathy without gangrene: Secondary | ICD-10-CM | POA: Diagnosis not present

## 2015-01-27 DIAGNOSIS — E1140 Type 2 diabetes mellitus with diabetic neuropathy, unspecified: Secondary | ICD-10-CM | POA: Diagnosis not present

## 2015-03-04 DIAGNOSIS — Z85828 Personal history of other malignant neoplasm of skin: Secondary | ICD-10-CM | POA: Diagnosis not present

## 2015-03-04 DIAGNOSIS — L814 Other melanin hyperpigmentation: Secondary | ICD-10-CM | POA: Diagnosis not present

## 2015-03-04 DIAGNOSIS — L570 Actinic keratosis: Secondary | ICD-10-CM | POA: Diagnosis not present

## 2015-03-09 DIAGNOSIS — H8113 Benign paroxysmal vertigo, bilateral: Secondary | ICD-10-CM | POA: Diagnosis not present

## 2015-03-09 DIAGNOSIS — Z6833 Body mass index (BMI) 33.0-33.9, adult: Secondary | ICD-10-CM | POA: Diagnosis not present

## 2015-03-09 DIAGNOSIS — R42 Dizziness and giddiness: Secondary | ICD-10-CM | POA: Diagnosis not present

## 2015-03-09 DIAGNOSIS — E6609 Other obesity due to excess calories: Secondary | ICD-10-CM | POA: Diagnosis not present

## 2015-04-07 DIAGNOSIS — E1151 Type 2 diabetes mellitus with diabetic peripheral angiopathy without gangrene: Secondary | ICD-10-CM | POA: Diagnosis not present

## 2015-04-07 DIAGNOSIS — E1140 Type 2 diabetes mellitus with diabetic neuropathy, unspecified: Secondary | ICD-10-CM | POA: Diagnosis not present

## 2015-04-16 DIAGNOSIS — L308 Other specified dermatitis: Secondary | ICD-10-CM | POA: Diagnosis not present

## 2015-04-16 DIAGNOSIS — Z6833 Body mass index (BMI) 33.0-33.9, adult: Secondary | ICD-10-CM | POA: Diagnosis not present

## 2015-04-16 DIAGNOSIS — E6609 Other obesity due to excess calories: Secondary | ICD-10-CM | POA: Diagnosis not present

## 2015-04-21 DIAGNOSIS — H40013 Open angle with borderline findings, low risk, bilateral: Secondary | ICD-10-CM | POA: Diagnosis not present

## 2015-04-23 DIAGNOSIS — E6609 Other obesity due to excess calories: Secondary | ICD-10-CM | POA: Diagnosis not present

## 2015-04-23 DIAGNOSIS — E782 Mixed hyperlipidemia: Secondary | ICD-10-CM | POA: Diagnosis not present

## 2015-04-23 DIAGNOSIS — E119 Type 2 diabetes mellitus without complications: Secondary | ICD-10-CM | POA: Diagnosis not present

## 2015-04-23 DIAGNOSIS — Z6834 Body mass index (BMI) 34.0-34.9, adult: Secondary | ICD-10-CM | POA: Diagnosis not present

## 2015-04-23 DIAGNOSIS — Z0000 Encounter for general adult medical examination without abnormal findings: Secondary | ICD-10-CM | POA: Diagnosis not present

## 2015-07-14 DIAGNOSIS — E1151 Type 2 diabetes mellitus with diabetic peripheral angiopathy without gangrene: Secondary | ICD-10-CM | POA: Diagnosis not present

## 2015-07-14 DIAGNOSIS — E1140 Type 2 diabetes mellitus with diabetic neuropathy, unspecified: Secondary | ICD-10-CM | POA: Diagnosis not present

## 2015-07-17 DIAGNOSIS — R3915 Urgency of urination: Secondary | ICD-10-CM | POA: Diagnosis not present

## 2015-07-17 DIAGNOSIS — N200 Calculus of kidney: Secondary | ICD-10-CM | POA: Diagnosis not present

## 2015-07-17 DIAGNOSIS — N403 Nodular prostate with lower urinary tract symptoms: Secondary | ICD-10-CM | POA: Diagnosis not present

## 2015-07-17 DIAGNOSIS — N281 Cyst of kidney, acquired: Secondary | ICD-10-CM | POA: Diagnosis not present

## 2015-07-17 DIAGNOSIS — N400 Enlarged prostate without lower urinary tract symptoms: Secondary | ICD-10-CM | POA: Diagnosis not present

## 2015-08-20 DIAGNOSIS — Z23 Encounter for immunization: Secondary | ICD-10-CM | POA: Diagnosis not present

## 2015-09-09 ENCOUNTER — Other Ambulatory Visit (HOSPITAL_COMMUNITY): Payer: Self-pay | Admitting: Family Medicine

## 2015-09-09 ENCOUNTER — Ambulatory Visit (HOSPITAL_COMMUNITY)
Admission: RE | Admit: 2015-09-09 | Discharge: 2015-09-09 | Disposition: A | Discharge status: Home / Self Care | Payer: Medicare Other | Source: Ambulatory Visit | Attending: Family Medicine | Admitting: Family Medicine

## 2015-09-09 DIAGNOSIS — G319 Degenerative disease of nervous system, unspecified: Secondary | ICD-10-CM | POA: Insufficient documentation

## 2015-09-09 DIAGNOSIS — R51 Headache: Secondary | ICD-10-CM | POA: Diagnosis not present

## 2015-09-09 DIAGNOSIS — G44029 Chronic cluster headache, not intractable: Secondary | ICD-10-CM

## 2015-09-09 DIAGNOSIS — Z6833 Body mass index (BMI) 33.0-33.9, adult: Secondary | ICD-10-CM | POA: Diagnosis not present

## 2015-09-09 DIAGNOSIS — F329 Major depressive disorder, single episode, unspecified: Secondary | ICD-10-CM | POA: Insufficient documentation

## 2015-09-09 DIAGNOSIS — Z1389 Encounter for screening for other disorder: Secondary | ICD-10-CM | POA: Diagnosis not present

## 2015-09-22 DIAGNOSIS — E1151 Type 2 diabetes mellitus with diabetic peripheral angiopathy without gangrene: Secondary | ICD-10-CM | POA: Diagnosis not present

## 2015-09-22 DIAGNOSIS — E1140 Type 2 diabetes mellitus with diabetic neuropathy, unspecified: Secondary | ICD-10-CM | POA: Diagnosis not present

## 2015-09-30 DIAGNOSIS — E119 Type 2 diabetes mellitus without complications: Secondary | ICD-10-CM | POA: Diagnosis not present

## 2015-09-30 DIAGNOSIS — H40013 Open angle with borderline findings, low risk, bilateral: Secondary | ICD-10-CM | POA: Diagnosis not present

## 2015-09-30 DIAGNOSIS — Z961 Presence of intraocular lens: Secondary | ICD-10-CM | POA: Diagnosis not present

## 2015-11-10 DIAGNOSIS — J069 Acute upper respiratory infection, unspecified: Secondary | ICD-10-CM | POA: Diagnosis not present

## 2015-11-10 DIAGNOSIS — Z6834 Body mass index (BMI) 34.0-34.9, adult: Secondary | ICD-10-CM | POA: Diagnosis not present

## 2015-11-10 DIAGNOSIS — I10 Essential (primary) hypertension: Secondary | ICD-10-CM | POA: Diagnosis not present

## 2015-11-10 DIAGNOSIS — Z1389 Encounter for screening for other disorder: Secondary | ICD-10-CM | POA: Diagnosis not present

## 2015-11-10 DIAGNOSIS — E782 Mixed hyperlipidemia: Secondary | ICD-10-CM | POA: Diagnosis not present

## 2015-11-10 DIAGNOSIS — E6609 Other obesity due to excess calories: Secondary | ICD-10-CM | POA: Diagnosis not present

## 2015-11-10 DIAGNOSIS — E119 Type 2 diabetes mellitus without complications: Secondary | ICD-10-CM | POA: Diagnosis not present

## 2015-12-08 DIAGNOSIS — E1151 Type 2 diabetes mellitus with diabetic peripheral angiopathy without gangrene: Secondary | ICD-10-CM | POA: Diagnosis not present

## 2015-12-08 DIAGNOSIS — E1140 Type 2 diabetes mellitus with diabetic neuropathy, unspecified: Secondary | ICD-10-CM | POA: Diagnosis not present

## 2016-01-18 DIAGNOSIS — N281 Cyst of kidney, acquired: Secondary | ICD-10-CM | POA: Diagnosis not present

## 2016-01-18 DIAGNOSIS — R3915 Urgency of urination: Secondary | ICD-10-CM | POA: Diagnosis not present

## 2016-01-18 DIAGNOSIS — N200 Calculus of kidney: Secondary | ICD-10-CM | POA: Diagnosis not present

## 2016-01-18 DIAGNOSIS — N401 Enlarged prostate with lower urinary tract symptoms: Secondary | ICD-10-CM | POA: Diagnosis not present

## 2016-01-29 DIAGNOSIS — N281 Cyst of kidney, acquired: Secondary | ICD-10-CM | POA: Diagnosis not present

## 2016-01-29 DIAGNOSIS — N200 Calculus of kidney: Secondary | ICD-10-CM | POA: Diagnosis not present

## 2016-02-16 DIAGNOSIS — E1151 Type 2 diabetes mellitus with diabetic peripheral angiopathy without gangrene: Secondary | ICD-10-CM | POA: Diagnosis not present

## 2016-02-16 DIAGNOSIS — E1140 Type 2 diabetes mellitus with diabetic neuropathy, unspecified: Secondary | ICD-10-CM | POA: Diagnosis not present

## 2016-04-11 DIAGNOSIS — H40012 Open angle with borderline findings, low risk, left eye: Secondary | ICD-10-CM | POA: Diagnosis not present

## 2016-04-11 DIAGNOSIS — H40011 Open angle with borderline findings, low risk, right eye: Secondary | ICD-10-CM | POA: Diagnosis not present

## 2016-04-26 DIAGNOSIS — E119 Type 2 diabetes mellitus without complications: Secondary | ICD-10-CM | POA: Diagnosis not present

## 2016-04-26 DIAGNOSIS — Z6836 Body mass index (BMI) 36.0-36.9, adult: Secondary | ICD-10-CM | POA: Diagnosis not present

## 2016-04-26 DIAGNOSIS — Z0000 Encounter for general adult medical examination without abnormal findings: Secondary | ICD-10-CM | POA: Diagnosis not present

## 2016-04-26 DIAGNOSIS — Z1389 Encounter for screening for other disorder: Secondary | ICD-10-CM | POA: Diagnosis not present

## 2016-04-26 DIAGNOSIS — E6609 Other obesity due to excess calories: Secondary | ICD-10-CM | POA: Diagnosis not present

## 2016-05-02 DIAGNOSIS — Z6836 Body mass index (BMI) 36.0-36.9, adult: Secondary | ICD-10-CM | POA: Diagnosis not present

## 2016-05-02 DIAGNOSIS — E782 Mixed hyperlipidemia: Secondary | ICD-10-CM | POA: Diagnosis not present

## 2016-05-02 DIAGNOSIS — E6609 Other obesity due to excess calories: Secondary | ICD-10-CM | POA: Diagnosis not present

## 2016-05-02 DIAGNOSIS — E119 Type 2 diabetes mellitus without complications: Secondary | ICD-10-CM | POA: Diagnosis not present

## 2016-05-02 DIAGNOSIS — I10 Essential (primary) hypertension: Secondary | ICD-10-CM | POA: Diagnosis not present

## 2016-05-03 DIAGNOSIS — E1151 Type 2 diabetes mellitus with diabetic peripheral angiopathy without gangrene: Secondary | ICD-10-CM | POA: Diagnosis not present

## 2016-05-03 DIAGNOSIS — E1140 Type 2 diabetes mellitus with diabetic neuropathy, unspecified: Secondary | ICD-10-CM | POA: Diagnosis not present

## 2016-07-12 DIAGNOSIS — E1151 Type 2 diabetes mellitus with diabetic peripheral angiopathy without gangrene: Secondary | ICD-10-CM | POA: Diagnosis not present

## 2016-07-12 DIAGNOSIS — E1140 Type 2 diabetes mellitus with diabetic neuropathy, unspecified: Secondary | ICD-10-CM | POA: Diagnosis not present

## 2016-07-25 DIAGNOSIS — N200 Calculus of kidney: Secondary | ICD-10-CM | POA: Diagnosis not present

## 2016-07-25 DIAGNOSIS — N401 Enlarged prostate with lower urinary tract symptoms: Secondary | ICD-10-CM | POA: Diagnosis not present

## 2016-07-25 DIAGNOSIS — N3941 Urge incontinence: Secondary | ICD-10-CM | POA: Diagnosis not present

## 2016-07-25 DIAGNOSIS — N281 Cyst of kidney, acquired: Secondary | ICD-10-CM | POA: Diagnosis not present

## 2016-08-02 DIAGNOSIS — Z23 Encounter for immunization: Secondary | ICD-10-CM | POA: Diagnosis not present

## 2016-08-25 ENCOUNTER — Emergency Department (HOSPITAL_COMMUNITY): Payer: Medicare Other

## 2016-08-25 ENCOUNTER — Observation Stay (HOSPITAL_COMMUNITY)
Admission: EM | Admit: 2016-08-25 | Discharge: 2016-08-26 | Disposition: A | Discharge status: Home / Self Care | Payer: Medicare Other | Attending: Family Medicine | Admitting: Family Medicine

## 2016-08-25 ENCOUNTER — Encounter (HOSPITAL_COMMUNITY): Payer: Self-pay | Admitting: Emergency Medicine

## 2016-08-25 DIAGNOSIS — R0602 Shortness of breath: Secondary | ICD-10-CM | POA: Diagnosis not present

## 2016-08-25 DIAGNOSIS — Z7982 Long term (current) use of aspirin: Secondary | ICD-10-CM | POA: Insufficient documentation

## 2016-08-25 DIAGNOSIS — I700 Atherosclerosis of aorta: Secondary | ICD-10-CM

## 2016-08-25 DIAGNOSIS — I4891 Unspecified atrial fibrillation: Secondary | ICD-10-CM | POA: Insufficient documentation

## 2016-08-25 DIAGNOSIS — Z87891 Personal history of nicotine dependence: Secondary | ICD-10-CM | POA: Insufficient documentation

## 2016-08-25 DIAGNOSIS — C3490 Malignant neoplasm of unspecified part of unspecified bronchus or lung: Secondary | ICD-10-CM | POA: Diagnosis not present

## 2016-08-25 DIAGNOSIS — R079 Chest pain, unspecified: Secondary | ICD-10-CM

## 2016-08-25 DIAGNOSIS — R0789 Other chest pain: Principal | ICD-10-CM | POA: Insufficient documentation

## 2016-08-25 DIAGNOSIS — E119 Type 2 diabetes mellitus without complications: Secondary | ICD-10-CM | POA: Diagnosis not present

## 2016-08-25 DIAGNOSIS — I2510 Atherosclerotic heart disease of native coronary artery without angina pectoris: Secondary | ICD-10-CM | POA: Diagnosis not present

## 2016-08-25 DIAGNOSIS — R918 Other nonspecific abnormal finding of lung field: Secondary | ICD-10-CM

## 2016-08-25 DIAGNOSIS — E7800 Pure hypercholesterolemia, unspecified: Secondary | ICD-10-CM | POA: Diagnosis not present

## 2016-08-25 DIAGNOSIS — N400 Enlarged prostate without lower urinary tract symptoms: Secondary | ICD-10-CM | POA: Diagnosis not present

## 2016-08-25 DIAGNOSIS — Z79899 Other long term (current) drug therapy: Secondary | ICD-10-CM | POA: Insufficient documentation

## 2016-08-25 DIAGNOSIS — Z85118 Personal history of other malignant neoplasm of bronchus and lung: Secondary | ICD-10-CM | POA: Insufficient documentation

## 2016-08-25 DIAGNOSIS — I712 Thoracic aortic aneurysm, without rupture, unspecified: Secondary | ICD-10-CM

## 2016-08-25 DIAGNOSIS — Z902 Acquired absence of lung [part of]: Secondary | ICD-10-CM | POA: Diagnosis not present

## 2016-08-25 DIAGNOSIS — Z7984 Long term (current) use of oral hypoglycemic drugs: Secondary | ICD-10-CM | POA: Insufficient documentation

## 2016-08-25 DIAGNOSIS — Z66 Do not resuscitate: Secondary | ICD-10-CM | POA: Diagnosis not present

## 2016-08-25 DIAGNOSIS — E785 Hyperlipidemia, unspecified: Secondary | ICD-10-CM | POA: Diagnosis not present

## 2016-08-25 DIAGNOSIS — J90 Pleural effusion, not elsewhere classified: Secondary | ICD-10-CM | POA: Diagnosis not present

## 2016-08-25 DIAGNOSIS — I10 Essential (primary) hypertension: Secondary | ICD-10-CM | POA: Diagnosis not present

## 2016-08-25 DIAGNOSIS — Z6837 Body mass index (BMI) 37.0-37.9, adult: Secondary | ICD-10-CM | POA: Diagnosis not present

## 2016-08-25 DIAGNOSIS — I480 Paroxysmal atrial fibrillation: Secondary | ICD-10-CM | POA: Diagnosis not present

## 2016-08-25 HISTORY — DX: Thoracic aortic aneurysm, without rupture: I71.2

## 2016-08-25 HISTORY — DX: Thoracic aortic aneurysm, without rupture, unspecified: I71.20

## 2016-08-25 LAB — MRSA PCR SCREENING: MRSA by PCR: NEGATIVE

## 2016-08-25 LAB — TROPONIN I
Troponin I: <0.03 ng/mL (ref <0.03)
Troponin I: <0.03 ng/mL (ref <0.03)

## 2016-08-25 LAB — BASIC METABOLIC PANEL
ANION GAP: 9 (ref 5–15)
BUN: 19 mg/dL (ref 6–20)
CALCIUM: 10.3 mg/dL (ref 8.9–10.3)
CO2: 26 mmol/L (ref 22–32)
CREATININE: 0.8 mg/dL (ref 0.61–1.24)
Chloride: 100 mmol/L — ABNORMAL LOW (ref 101–111)
Glucose, Bld: 171 mg/dL — ABNORMAL HIGH (ref 65–99)
Potassium: 4 mmol/L (ref 3.5–5.1)
SODIUM: 135 mmol/L (ref 135–145)

## 2016-08-25 LAB — CBC
HCT: 40 % (ref 39.0–52.0)
HEMOGLOBIN: 13 g/dL (ref 13.0–17.0)
MCH: 27.3 pg (ref 26.0–34.0)
MCHC: 32.5 g/dL (ref 30.0–36.0)
MCV: 83.9 fL (ref 78.0–100.0)
PLATELETS: 212 10*3/uL (ref 150–400)
RBC: 4.77 MIL/uL (ref 4.22–5.81)
RDW: 15 % (ref 11.5–15.5)
WBC: 12.4 10*3/uL — AB (ref 4.0–10.5)

## 2016-08-25 LAB — D-DIMER, QUANTITATIVE (NOT AT ARMC): D DIMER QUANT: 3.59 ug{FEU}/mL — AB (ref 0.00–0.50)

## 2016-08-25 MED ORDER — ASPIRIN EC 81 MG PO TBEC
81.0000 mg | DELAYED_RELEASE_TABLET | Freq: Every day | ORAL | Status: DC
  Administered 2016-08-25 – 2016-08-26 (×2): 81 mg via ORAL
  Filled 2016-08-25 (×5): qty 1

## 2016-08-25 MED ORDER — ENOXAPARIN SODIUM 120 MG/0.8ML ~~LOC~~ SOLN
1.0000 mg/kg | Freq: Two times a day (BID) | SUBCUTANEOUS | Status: DC
  Administered 2016-08-25 – 2016-08-26 (×2): 110 mg via SUBCUTANEOUS
  Filled 2016-08-25 (×2): qty 0.8

## 2016-08-25 MED ORDER — ALFUZOSIN HCL ER 10 MG PO TB24
10.0000 mg | ORAL_TABLET | Freq: Every day | ORAL | Status: DC
  Administered 2016-08-25 – 2016-08-26 (×2): 10 mg via ORAL
  Filled 2016-08-25 (×5): qty 1

## 2016-08-25 MED ORDER — METOPROLOL TARTRATE 25 MG PO TABS
25.0000 mg | ORAL_TABLET | Freq: Two times a day (BID) | ORAL | Status: DC
  Administered 2016-08-25 – 2016-08-26 (×2): 25 mg via ORAL
  Filled 2016-08-25 (×2): qty 1

## 2016-08-25 MED ORDER — DILTIAZEM HCL 100 MG IV SOLR
5.0000 mg/h | INTRAVENOUS | Status: DC
  Administered 2016-08-25: 5 mg/h via INTRAVENOUS
  Filled 2016-08-25: qty 100

## 2016-08-25 MED ORDER — GI COCKTAIL ~~LOC~~
30.0000 mL | Freq: Four times a day (QID) | ORAL | Status: DC | PRN
  Filled 2016-08-25: qty 30

## 2016-08-25 MED ORDER — ONDANSETRON HCL 4 MG/2ML IJ SOLN: 4.0000 mg | Freq: Four times a day (QID) | INTRAMUSCULAR | Status: DC | PRN

## 2016-08-25 MED ORDER — MORPHINE SULFATE (PF) 2 MG/ML IV SOLN: 2.0000 mg | INTRAVENOUS | Status: DC | PRN

## 2016-08-25 MED ORDER — IOPAMIDOL (ISOVUE-370) INJECTION 76%
100.0000 mL | Freq: Once | INTRAVENOUS | Status: AC | PRN
  Administered 2016-08-25: 100 mL via INTRAVENOUS

## 2016-08-25 MED ORDER — ACETAMINOPHEN 325 MG PO TABS: 650.0000 mg | ORAL_TABLET | ORAL | Status: DC | PRN

## 2016-08-25 MED ORDER — SIMVASTATIN 20 MG PO TABS
40.0000 mg | ORAL_TABLET | Freq: Every day | ORAL | Status: DC
  Administered 2016-08-25: 40 mg via ORAL
  Filled 2016-08-25: qty 2

## 2016-08-25 MED ORDER — GLIPIZIDE 5 MG PO TABS
5.0000 mg | ORAL_TABLET | Freq: Two times a day (BID) | ORAL | Status: DC
  Administered 2016-08-25 – 2016-08-26 (×2): 5 mg via ORAL
  Filled 2016-08-25 (×2): qty 1

## 2016-08-25 MED ORDER — OXYBUTYNIN CHLORIDE ER 5 MG PO TB24
10.0000 mg | ORAL_TABLET | Freq: Every day | ORAL | Status: DC
  Administered 2016-08-25: 10 mg via ORAL
  Filled 2016-08-25: qty 2

## 2016-08-25 MED ORDER — DILTIAZEM LOAD VIA INFUSION
20.0000 mg | Freq: Once | INTRAVENOUS | Status: AC
  Administered 2016-08-25: 20 mg via INTRAVENOUS
  Filled 2016-08-25: qty 20

## 2016-08-25 MED ORDER — SODIUM CHLORIDE 0.9 % IV BOLUS (SEPSIS)
1000.0000 mL | Freq: Once | INTRAVENOUS | Status: AC
  Administered 2016-08-25: 1000 mL via INTRAVENOUS

## 2016-08-25 MED ORDER — LOSARTAN POTASSIUM 50 MG PO TABS
100.0000 mg | ORAL_TABLET | Freq: Every day | ORAL | Status: DC
  Administered 2016-08-25 – 2016-08-26 (×2): 100 mg via ORAL
  Filled 2016-08-25 (×2): qty 2

## 2016-08-25 NOTE — ED Triage Notes (Signed)
Pt sent over by Dr Hilma Favors. Pt states he thinks his EKG was abnormal at the office. Pt reports sudden onset of central CP that worsens with deep inspiration. No radiation of pain. NAD noted.

## 2016-08-25 NOTE — ED Provider Notes (Signed)
[facility] DEPT Provider Note   CSN: [id number] Arrival date & time: 08/25/16  0037  By signing my name below, I, Sonum Patel, attest that this documentation has been prepared under the direction and in the presence of Merrily Pew, MD. Electronically Signed: Sonum Patel, Education administrator. 08/25/16. 9:58 AM.  History   Chief Complaint Chief Complaint  Patient presents with  . Chest Pain   The history is provided by the patient. No language interpreter was used.   HPI Comments: [name] is a 82 y.o. male with past medical history of DM, HLD, HTN who presents to the Emergency Department complaining of chest pain that began last night at rest. He states the pain is worse with deep breathing. He reports associated cough productive of yellow/clear phlegm for the past week. He reports similar symptoms in the past many years ago which required a stress test. Patient was seen by PCP Hilma Favors this morning who advised patient to be evaluated in the ED. Patient states in 2008 he had a partial lobectomy due to lung CA. He denies palpitations, SOB, nausea, vomiting, diaphoresis.    Past Medical History:  Diagnosis Date  . Colon polyps   . Diabetes mellitus   . Enlarged prostate   . History of kidney stones   . Hypercholesteremia   . Hypertension   . lung ca dx'd 01/2007   seed implants  . Tobacco abuse     Patient Active Problem List   Diagnosis Date Noted  . Hx of cancer of lung 11/07/2011  . Hypercholesteremia   . History of kidney stones     Past Surgical History:  Procedure Laterality Date  . APPENDECTOMY    . CERVICAL SPINE SURGERY    . CHOLECYSTECTOMY    . COLONOSCOPY  04/17/2012   Procedure: COLONOSCOPY;  Surgeon: Jamesetta So, MD;  Location: [facility];  Service: Gastroenterology;  Laterality: N/A;  . COLONOSCOPY W/ POLYPECTOMY    . TONSILLECTOMY    . wedge resection with seed implantation and node sampling with right VATS  03/13/2007       Home Medications     Prior to Admission medications   Medication Sig Start Date End Date Taking? Authorizing Provider  aspirin 81 MG EC tablet Take 81 mg by mouth daily.     Yes Historical Provider, MD  folic acid (FOLVITE) 048 MCG tablet Take 800 mcg by mouth daily.   Yes Historical Provider, MD  glipiZIDE (GLUCOTROL) 5 MG tablet Take 5 mg by mouth 2 (two) times daily before a meal.   Yes Historical Provider, MD  Omega-3 Fatty Acids (FISH OIL) 1000 MG CAPS Take by mouth.   Yes Historical Provider, MD  alfuzosin (UROXATRAL) 10 MG 24 hr tablet Take 10 mg by mouth daily.      Historical Provider, MD  JANUMET 50-1000 MG tablet 1 tablet 2 (two) times daily. 07/21/16   Historical Provider, MD  losartan (COZAAR) 100 MG tablet Take 100 mg by mouth daily.    Historical Provider, MD  oxybutynin (DITROPAN-XL) 10 MG 24 hr tablet Take 10 mg by mouth at bedtime.    Historical Provider, MD  simvastatin (ZOCOR) 40 MG tablet Take 40 mg by mouth at bedtime.      Historical Provider, MD    Family History Family History  Problem Relation Age of Onset  . Colon cancer Neg Hx     Social History Social History  Substance Use Topics  . Smoking status: Former Smoker  Packs/day: 1.50    Years: 15.00    Types: Cigarettes    Quit date: 11/28/1964  . Smokeless tobacco: Never Used  . Alcohol use No     Allergies   Sulfa antibiotics   Review of Systems Review of Systems  Constitutional: Negative for diaphoresis.  Respiratory: Positive for cough. Negative for shortness of breath.   Cardiovascular: Positive for chest pain. Negative for palpitations.  Gastrointestinal: Negative for vomiting.  All other systems reviewed and are negative.    Physical Exam Updated Vital Signs BP 124/68   Pulse (!) 59   Temp 98.5 F (36.9 C) (Oral)   Resp 24   Ht '5\' 9"'$  (1.753 m)   Wt 263 lb (119.3 kg)   SpO2 95%   BMI 38.84 kg/m   Physical Exam  Constitutional: He is oriented to person, place, and time. He appears well-developed  and well-nourished.  HENT:  Head: Normocephalic and atraumatic.  Cardiovascular: An irregular rhythm present. Tachycardia present.  Exam reveals no gallop and no friction rub.   No murmur heard. Pulmonary/Chest: Tachypnea noted. He has wheezes. He exhibits tenderness.  Slightly hypoxic. Slight decreased breath sounds, right greater than left. Diffuse expiratory wheezing. Mild central chest tenderness  Abdominal: Soft. He exhibits no distension. There is no tenderness. There is no rebound and no guarding.  Neurological: He is alert and oriented to person, place, and time.  Skin: Skin is warm and dry. No rash noted.  No rash  Psychiatric: He has a normal mood and affect.  Nursing note and vitals reviewed.    ED Treatments / Results  DIAGNOSTIC STUDIES: Oxygen Saturation is 95% on RA, adequate by my interpretation.    COORDINATION OF CARE: 9:49 AM Discussed treatment plan with pt at bedside and pt agreed to plan.    Labs (all labs ordered are listed, but only abnormal results are displayed) Labs Reviewed  BASIC METABOLIC PANEL - Abnormal; Notable for the following:       Result Value   Chloride 100 (*)    Glucose, Bld 171 (*)    All other components within normal limits  CBC - Abnormal; Notable for the following:    WBC 12.4 (*)    All other components within normal limits  D-DIMER, QUANTITATIVE (NOT AT [facility]) - Abnormal; Notable for the following:    D-Dimer, Quant 3.59 (*)    All other components within normal limits  TROPONIN I    EKG  EKG Interpretation  Date/Time:  Thursday August 25 2016 09:41:03 EDT Ventricular Rate:  132 PR Interval:    QRS Duration: 83 QT Interval:  321 QTC Calculation: 448 R Axis:   3 Text Interpretation:  Atrial fibrillation with rapid ventricular response Low voltage, precordial leads Abnormal R-wave progression, early transition new afib since 2008 Confirmed by [facility] MD, Corene Cornea [phone]) on 08/26/2016 8:59:39 AM       Radiology Dg  Chest 2 View  Result Date: 08/25/2016 CLINICAL DATA:  Chest pain.  History of lung carcinoma EXAM: CHEST  2 VIEW COMPARISON:  February 17, 2011 chest radiograph; chest CT October 29, 2014 FINDINGS: There is postoperative change on the right. There is scarring in the right mid and lower lung zones. There is a small right pleural effusion. There is mild scarring in the left lung base. Left lung is otherwise clear. Heart is upper normal in size with pulmonary vascularity within normal limits. No adenopathy. There is atherosclerotic calcification in the aorta. Bones appear somewhat osteoporotic. No blastic  or lytic bone lesions are evident. IMPRESSION: Postoperative change and scarring on the right. Small right pleural effusion. Mild scarring left base. No edema or consolidation. Stable cardiac silhouette. Aortic atherosclerosis. No adenopathy evident. Electronically Signed   By: Lowella Grip III M.D.   On: 08/25/2016 10:38    Procedures Procedures (including critical care time)  CRITICAL CARE Performed by: Merrily Pew Total critical care time: 45 minutes Critical care time was exclusive of separately billable procedures and treating other patients. Critical care was necessary to treat or prevent imminent or life-threatening deterioration. Critical care was time spent personally by me on the following activities: development of treatment plan with patient and/or surrogate as well as nursing, discussions with consultants, evaluation of patient's response to treatment, examination of patient, obtaining history from patient or surrogate, ordering and performing treatments and interventions, ordering and review of laboratory studies, ordering and review of radiographic studies, pulse oximetry and re-evaluation of patient's condition.   Medications Ordered in ED Medications  diltiazem (CARDIZEM) 1 mg/mL load via infusion 20 mg (20 mg Intravenous Bolus from Bag 08/25/16 1045)    And  diltiazem  (CARDIZEM) 100 mg in dextrose 5 % 100 mL (1 mg/mL) infusion (5 mg/hr Intravenous New Bag/Given 08/25/16 1044)  sodium chloride 0.9 % bolus 1,000 mL (1,000 mLs Intravenous New Bag/Given 08/25/16 1044)   11:16 AM Patient rechecked, states the chest pain has not improved. Patient understands he may require hospitalization.   Initial Impression / Assessment and Plan / ED Course  I have reviewed the triage vital signs and the nursing notes.  Pertinent labs & imaging results that were available during my care of the patient were reviewed by me and considered in my medical decision making (see chart for details).  Clinical Course    Here with afib w/ rvr of new undetermined onset. Also likely recurrent lung cancer and pleural effusions. Started on diltiazem drip but held on anticoagulation until cancer workup determined (in case he needs biopsy or other procedure). Stable on 5 mg/hr of diltiazem, will admit to stepdown.   Final Clinical Impressions(s) / ED Diagnoses   Final diagnoses:  Atrial fibrillation with rapid ventricular response (HCC)  Pleural effusion  Malignant neoplasm of lung, unspecified laterality, unspecified part of lung ([place])    New Prescriptions New Prescriptions   No medications on file    I personally performed the services described in this documentation, which was scribed in my presence. The recorded information has been reviewed and is accurate.    Merrily Pew, MD 08/26/16 [phone]

## 2016-08-25 NOTE — H&P (Signed)
History and Physical  [name] WNI:[id number] DOB: 08-Apr-1927 DOA: 08/25/2016  PCP: Purvis Kilts, MD  Patient coming from: Home   Chief Complaint: Chest pain   HPI:  82 year old man with a PMH of DM, HTN, HLD, and lung cancer s/p lobectomy in 2008 referred to the emergency department by his PCP today for abnormal heart rhythm. Initial evaluation revealed atrial fibrillation with rapid ventricular response as well as reported chest pain.   Patient was feeling well yesterday. Last evening he developed pleuritic pain in the chest which she describes as moderate in severity, stabbing in quality, worse with deep breathing, worse with cough. He went to see his PCP today, EKG was performed which was abnormal and so he was sent to the emergency department for further evaluation. No palpitations noted. No shortness of breath.  ED Course: Started on IV diltiazem. Pertinent labs: Troponin negative, BMP unremarkable, CBC 12.4, remainder CBC unremarkable.  EKG: Independently reviewed. Atrial fibrillation with rapid ventricular response  Imaging: CXR shows postoperative change and scarring on the right. With a small right pleural effusion, mild scarring left, and aortic atherosclerosis. CT chest concerning for recurrent lung cancer.  Review of Systems:  Negative for fever, visual changes, sore throat, rash, new muscle aches, chest pain, SOB, dysuria, bleeding, n/v/abdominal pain, diaphoresis.  Past Medical History:  Diagnosis Date  . Colon polyps   . Diabetes mellitus   . Enlarged prostate   . History of kidney stones   . Hypercholesteremia   . Hypertension   . lung ca dx'd 01/2007   seed implants  . Thoracic aortic aneurysm ([place]) 08/25/2016  . Tobacco abuse     Past Surgical History:  Procedure Laterality Date  . APPENDECTOMY    . CERVICAL SPINE SURGERY    . CHOLECYSTECTOMY    . COLONOSCOPY  04/17/2012   Procedure: COLONOSCOPY;  Surgeon: Jamesetta So, MD;  Location: [facility];  Service: Gastroenterology;  Laterality: N/A;  . COLONOSCOPY W/ POLYPECTOMY    . TONSILLECTOMY    . wedge resection with seed implantation and node sampling with right VATS  03/13/2007     reports that he quit smoking about 51 years ago. His smoking use included Cigarettes. He has a 22.50 pack-year smoking history. He has never used smokeless tobacco. He reports that he does not drink alcohol or use drugs. Ambulatory status: Stable   Allergies  Allergen Reactions  . Sulfa Antibiotics Rash    Family History  Problem Relation Age of Onset  . Colon cancer Neg Hx      Prior to Admission medications   Medication Sig Start Date End Date Taking? Authorizing Provider  aspirin 81 MG EC tablet Take 81 mg by mouth daily.     Yes Historical Provider, MD  folic acid (FOLVITE) 381 MCG tablet Take 800 mcg by mouth daily.   Yes Historical Provider, MD  glipiZIDE (GLUCOTROL) 5 MG tablet Take 5 mg by mouth 2 (two) times daily before a meal.   Yes Historical Provider, MD  Omega-3 Fatty Acids (FISH OIL) 1000 MG CAPS Take by mouth.   Yes Historical Provider, MD  alfuzosin (UROXATRAL) 10 MG 24 hr tablet Take 10 mg by mouth daily.      Historical Provider, MD  JANUMET 50-1000 MG tablet 1 tablet 2 (two) times daily. 07/21/16   Historical Provider, MD  losartan (COZAAR) 100 MG tablet Take 100 mg by mouth daily.    Historical Provider, MD  oxybutynin (DITROPAN-XL) 10 MG  24 hr tablet Take 10 mg by mouth at bedtime.    Historical Provider, MD  simvastatin (ZOCOR) 40 MG tablet Take 40 mg by mouth at bedtime.      Historical Provider, MD    Physical Exam: Vitals:   08/25/16 1237 08/25/16 1300 08/25/16 1433 08/25/16 1500  BP: 114/71 118/71 118/68 (!) 98/55  Pulse: 89 88 91 96  Resp: '24 23 20 '$ (!) 23  Temp: 98.2 F (36.8 C)     TempSrc: Oral     SpO2: 100% 98% 99% 97%  Weight:   111.5 kg (245 lb 13 oz)   Height:   '5\' 9"'$  (1.753 m)    Constitutional:  . Appears calm and comfortable Eyes:  . PERRL  and irises appear normal . Normal conjunctivae and lids ENMT:  . external ears, nose appear normal . grossly normal hearing . Lips appear normal; tongue normal Respiratory:  . CTA bilaterally, no w/r/r.  . Respiratory effort normal. No retractions or accessory muscle use Cardiovascular:  . Irregular, no m/r/g . 2+ bilateral LE extremity edema  . Telemetry: Afib  Abdomen:   soft, non tender, and non distended.  . No hernias Musculoskeletal:  . Moves all extremities  . Tone and strength appear normal Skin:  . No rashes, lesions, ulcers . palpation of skin: no induration or nodules Neurologic:  . Grossly normal  Psychiatric:  . judgement and insight appear normal . Mental status o Orientation to person, place, time  o Mood, affect appropriate  Wt Readings from Last 3 Encounters:  08/25/16 111.5 kg (245 lb 13 oz)  10/30/14 109.5 kg (241 lb 4.8 oz)  10/30/13 114.5 kg (252 lb 8 oz)    I have personally reviewed following labs and imaging studies  Labs on Admission:  CBC:  Recent Labs Lab 08/25/16 0948  WBC 12.4*  HGB 13.0  HCT 40.0  MCV 83.9  PLT 322   Basic Metabolic Panel:  Recent Labs Lab 08/25/16 0948  NA 135  K 4.0  CL 100*  CO2 26  GLUCOSE 171*  BUN 19  CREATININE 0.80  CALCIUM 10.3   Cardiac Enzymes:  Recent Labs Lab 08/25/16 0948  TROPONINI <0.03     Radiological Exams on Admission: Dg Chest 2 View  Result Date: 08/25/2016 CLINICAL DATA:  Chest pain.  History of lung carcinoma EXAM: CHEST  2 VIEW COMPARISON:  February 17, 2011 chest radiograph; chest CT October 29, 2014 FINDINGS: There is postoperative change on the right. There is scarring in the right mid and lower lung zones. There is a small right pleural effusion. There is mild scarring in the left lung base. Left lung is otherwise clear. Heart is upper normal in size with pulmonary vascularity within normal limits. No adenopathy. There is atherosclerotic calcification in the aorta.  Bones appear somewhat osteoporotic. No blastic or lytic bone lesions are evident. IMPRESSION: Postoperative change and scarring on the right. Small right pleural effusion. Mild scarring left base. No edema or consolidation. Stable cardiac silhouette. Aortic atherosclerosis. No adenopathy evident. Electronically Signed   By: Lowella Grip III M.D.   On: 08/25/2016 10:38   Ct Angio Chest Pe W Or Wo Contrast  Result Date: 08/25/2016 CLINICAL DATA:  Sudden-onset central chest pain worse with deep inspiration. Shortness of breath and tachycardia. History of right lung cancer with radiation seed implants. EXAM: CT ANGIOGRAPHY CHEST WITH CONTRAST TECHNIQUE: Multidetector CT imaging of the chest was performed using the standard protocol during bolus administration of intravenous  contrast. Multiplanar CT image reconstructions and MIPs were obtained to evaluate the vascular anatomy. CONTRAST:  100 mL Isovue 370 IV. COMPARISON:  CT 10/29/2014 and 10/29/2013 and 10/30/2012 FINDINGS: Cardiovascular: Mild cardiomegaly. Small pericardial effusion which is slightly worse and measures 1 cm in thickness anterior to the right ventricle. Mild calcification of the mitral valve annulus. Calcified plaque over the coronary arteries. Calcified plaque involving the thoracic aorta. Minimal aneurysmal dilatation of the ascending thoracic aorta measuring 4.2 cm in AP diameter unchanged. Pulmonary arterial system is within normal without evidence of emboli. Mediastinum/Nodes: 1 cm right peritracheal and AP window lymph nodes slightly larger. 1.1 cm precarinal lymph node slightly larger. Slight increase in size of the remaining sub cm mediastinal lymph nodes. Increase sub carinal adenopathy measuring 2.1 cm by short axis. Mild bilateral hilar adenopathy. Couple tiny sub cm right pericardial phrenic lymph nodes. Remaining mediastinal structures are unremarkable. Lungs/Pleura: Lungs are adequately inflated. Radiation seed implants are  present over the sub pleural region of the anterior right upper lobe likely representing the original site of patient's right lung cancer. There has been interval development of an irregular soft tissue mass along the medial aspect of the surgical site/radiation seeds measuring 3.8 x 8.1 cm in AP and transverse dimension. This likely represents local recurrence of patient's lung cancer. There is a new small right pleural effusion and tiny amount of left pleural fluid. Stable to slight increased prominence reticulonodular density over the right upper lobe. Focal scarring along the minor fissure unchanged. Mild posterior bibasilar dependent atelectasis. Focal 1 cm pleural-based nodularity over the left lower lobe more prominent. 1.5 cm focal pleural nodularity over the lateral right lower lobe likely pleural metastatic disease. Slight bronchiectatic change in the left lower lobe. Upper Abdomen: Sub cm hypodensity over the left lobe of the liver unchanged. Partially visualized cyst over the upper pole right kidney unchanged. Musculoskeletal: Fairly well-defined lytic lesion over a mid thoracic vertebral body unchanged from 2013 likely a hemangioma. Review of the MIP images confirms the above findings. IMPRESSION: No evidence of pulmonary embolism. Postsurgical change with radiation seed implants over the subpleural location of the anterior right upper lobe compatible with known history of lung cancer. Interval development of irregular soft tissue mass along the medial aspect of the anterior right upper lobe surgical site just medial to the radiation seeds measuring 3.8 x 8.1 cm likely representing local lung cancer recurrence. Small bilateral pleural effusions right greater than left with associated basilar atelectasis. Worsening mediastinal and hilar adenopathy likely metastatic nodal disease. Subtle reticulonodular opacification over the right upper lobe unchanged to slightly more prominent. 1 cm pleural-based nodule  opacity over the left lower lobe slightly more prominent. 1.5 cm focal pleural nodularity over the lateral right lower lobe which may be focal fluid versus metastatic pleural disease. Recommend attention on follow-up as this may represent metastatic disease. Mild cardiomegaly with small pericardial effusion as the fusion is slightly worse. Three vessel atherosclerotic coronary artery disease. Aortic atherosclerosis. Stable aneurysmal dilatation of the ascending thoracic aorta measuring 4.2 cm. Recommend semi-annual imaging followup by CTA or MRA and referral to cardiothoracic surgery if not already obtained. This recommendation follows 2010 ACCF/AHA/AATS/ACR/ASA/SCA/SCAI/SIR/STS/SVM Guidelines for the Diagnosis and Management of Patients With Thoracic Aortic Disease. Circulation. 2010; 121: e266-e36. Electronically Signed   By: Marin Olp M.D.   On: 08/25/2016 12:29    EKG: Independently reviewed. EKG shows atrial fibrillation with rapid ventricular response.   Principal Problem:   Atrial fibrillation with rapid ventricular response (  [place]) Active Problems:   Lung mass   Chest pain   DM type 2 (diabetes mellitus, type 2) ([place])   Aortic atherosclerosis ([place])   Thoracic aortic aneurysm (HCC)   Assessment/Plan 1. Atrial fibrillation with rapid ventricular response. Initial troponin negative. EKG non-acute. He does have a three-vessel disease by CT, significance of this is unclear, risk factors include diabetes, hypertension, and hyperlipidemia. CHA2DS2-VASc 5.  2. Chest pain. CT angiogram of chest was negative for PE or acute pulmonary process. EKG nonacute. Initial troponin negative. Atypical, tends to be pleuritic in nature. No evidence of ACS at this point. 3. 3 vessel atherosclerotic coronary artery disease by CT 4. Diabetes mellitus type 2 appears stable at this time, glucose 171 and AG normal. 5. Hyperlipidemia 6. Hypertension 7. Lung cancer, status post partial lobectomy 2008.   8. Irregular soft tissue mass see on CT today: along the medial aspect of the anterior right upper lobe surgical site just medial to the radiation seeds measuring 3.8 x 8.1 cm likely representing local lung cancer recurrence. Worsening mediastinal and hilar adenopathy likely metastatic nodal disease. 9. Aortic atherosclerosis 10. Stable aneurysmal dilatation of the ascending thoracic aorta measuring 4.2 cm. Recommend semi-annual imaging followup by CTA or MRA and referral to cardiothoracic surgery if not already obtained   Observation and stepdown   Rate control with IV diltiazem >> oral betablocker when able.   Cycle troponin, continue aspirin, statin  Consider anticoagulation, started discussion with pt wafarin vs NOAC   Arrange outpatient follow-up with oncologist Dr. Julien Nordmann.   Anticipate discharge tomorrow.   DVT prophylaxis:SCDs  Code Status: DNR Family Communication: No family bedside Disposition Plan: Anticipate discharge home in 24 hours    Consults called: None  Admission status: Observation  It is my clinical opinion that referral for OBSERVATION is reasonable and necessary in this 18 yom   . presenting with symptoms of chest pain and sign of afib rvr  . in the context of PMH including 3 vessel CAD, HTN, DM type 2, lung cancer  . and pertinent data including afib on EKG with rapid rate.  The aforementioned taken together are felt to place the patient at high risk for further clinical deterioration. However it is anticipated that the patient may be medically stable for discharge from the hospital within 24 to 48 hours.    Time spent: 55 minutes  Murray Hodgkins, MD  [facility] Direct contact: [phone] --[place] [place]  --[email or web address]; password [email or web address]  7PM-7AM contact night coverage as above  08/25/2016, 3:55 PM     By signing my name below, I, Collene Leyden, attest that this documentation has been prepared under the direction and in the presence  of Murray Hodgkins, MD. Electronically signed: Collene Leyden, Scribe. 08/25/16 2:29 AM   I personally performed the services described in this documentation. All medical record entries made by the scribe were at my direction. I have reviewed the chart and agree that the record reflects my personal performance and is accurate and complete. Murray Hodgkins, MD

## 2016-08-26 ENCOUNTER — Observation Stay (HOSPITAL_BASED_OUTPATIENT_CLINIC_OR_DEPARTMENT_OTHER): Payer: Medicare Other

## 2016-08-26 DIAGNOSIS — I4891 Unspecified atrial fibrillation: Secondary | ICD-10-CM | POA: Diagnosis not present

## 2016-08-26 DIAGNOSIS — R918 Other nonspecific abnormal finding of lung field: Secondary | ICD-10-CM | POA: Diagnosis not present

## 2016-08-26 DIAGNOSIS — R0789 Other chest pain: Secondary | ICD-10-CM | POA: Diagnosis not present

## 2016-08-26 DIAGNOSIS — I700 Atherosclerosis of aorta: Secondary | ICD-10-CM | POA: Diagnosis not present

## 2016-08-26 LAB — ECHOCARDIOGRAM COMPLETE
Height: 69 in
WEIGHTICAEL: 3968.28 [oz_av]

## 2016-08-26 LAB — TSH: TSH: 1.225 u[IU]/mL (ref 0.350–4.500)

## 2016-08-26 LAB — TROPONIN I

## 2016-08-26 MED ORDER — APIXABAN 5 MG PO TABS: 5.0000 mg | ORAL_TABLET | Freq: Two times a day (BID) | ORAL | 1 refills | Status: DC

## 2016-08-26 MED ORDER — PERFLUTREN LIPID MICROSPHERE
1.0000 mL | INTRAVENOUS | Status: DC | PRN
  Administered 2016-08-26: 2 mL via INTRAVENOUS

## 2016-08-26 MED ORDER — METOPROLOL TARTRATE 50 MG PO TABS: 50.0000 mg | ORAL_TABLET | Freq: Two times a day (BID) | ORAL | Status: DC

## 2016-08-26 MED ORDER — METOPROLOL TARTRATE 50 MG PO TABS: 50.0000 mg | ORAL_TABLET | Freq: Two times a day (BID) | ORAL | 0 refills | Status: DC

## 2016-08-26 MED ORDER — APIXABAN 5 MG PO TABS
5.0000 mg | ORAL_TABLET | Freq: Two times a day (BID) | ORAL | Status: DC
Start: 2016-08-26 — End: 2016-08-26
  Administered 2016-08-26: 5 mg via ORAL
  Filled 2016-08-26: qty 1

## 2016-08-26 NOTE — Progress Notes (Signed)
*  PRELIMINARY RESULTS* Echocardiogram 2D Echocardiogram has been performed with [facility].   Samuel Germany 08/26/2016, 4:23 PM

## 2016-08-26 NOTE — Progress Notes (Signed)
Discharge instructions reviewed with patient. Verbalized understanding. Pt dc'd to home.

## 2016-08-26 NOTE — Discharge Instructions (Signed)

## 2016-08-26 NOTE — Care Management Note (Signed)
Case Management Note  Patient Details  Name: [name] MRN: [id number] Date of Birth: [birth year]  Subjective/Objective:                  Pt admitted with a-fib with RVR. He is from home, lives alone and is ind with ADL's. He has strong family support. He plans to return home with self care. Pt will be started on Eliquis. 30-day voucher will be given. No further CM needs. Anticipate DC home today.   Action/Plan: No CM needs.   Expected Discharge Date:  08/29/16               Expected Discharge Plan:  Home/Self Care  In-House Referral:  NA  Discharge planning Services  CM Consult  Post Acute Care Choice:  NA Choice offered to:  NA  DME Arranged:    DME Agency:     HH Arranged:    HH Agency:     Status of Service:  Completed, signed off  If discussed at [employer] of Stay Meetings, dates discussed:    Additional Comments:  Sherald Barge, RN 08/26/2016, 2:08 PM

## 2016-08-26 NOTE — Progress Notes (Signed)
ANTICOAGULATION CONSULT NOTE - Initial Consult  Pharmacy Consult for [facility] Indication: atrial fibrillation  Allergies  Allergen Reactions  . Sulfa Antibiotics Rash   Patient Measurements: Height: '5\' 9"'$  (175.3 cm) Weight: 248 lb 0.3 oz (112.5 kg) IBW/kg (Calculated) : 70.7  Vital Signs: Temp: 99.2 F (37.3 C) (09/29 0800) Temp Source: Oral (09/29 0800) BP: 131/82 (09/29 0800) Pulse Rate: 88 (09/29 0800)  Labs:  Recent Labs  08/25/16 0948 08/25/16 1613 08/25/16 2128 08/26/16 0327  HGB 13.0  --   --   --   HCT 40.0  --   --   --   PLT 212  --   --   --   CREATININE 0.80  --   --   --   TROPONINI <0.03 <0.03 <0.03 <0.03   Estimated Creatinine Clearance: 80.4 mL/min (by C-G formula based on SCr of 0.8 mg/dL).  Medical History: Past Medical History:  Diagnosis Date  . Colon polyps   . Diabetes mellitus   . Enlarged prostate   . History of kidney stones   . Hypercholesteremia   . Hypertension   . lung ca dx'd 01/2007   seed implants  . Thoracic aortic aneurysm ([place]) 08/25/2016  . Tobacco abuse    Medications:  Prescriptions Prior to Admission  Medication Sig Dispense Refill Last Dose  . aspirin 81 MG EC tablet Take 81 mg by mouth daily.     08/25/2016 at Unknown time  . folic acid (FOLVITE) 923 MCG tablet Take 800 mcg by mouth daily.   08/25/2016 at Unknown time  . glipiZIDE (GLUCOTROL) 5 MG tablet Take 5 mg by mouth 2 (two) times daily before a meal.   08/25/2016 at Unknown time  . Omega-3 Fatty Acids (FISH OIL) 1000 MG CAPS Take by mouth.   08/24/2016 at Unknown time  . alfuzosin (UROXATRAL) 10 MG 24 hr tablet Take 10 mg by mouth daily.     unknown  . JANUMET 50-1000 MG tablet 1 tablet 2 (two) times daily.   unknown  . losartan (COZAAR) 100 MG tablet Take 100 mg by mouth daily.   unknown  . oxybutynin (DITROPAN-XL) 10 MG 24 hr tablet Take 10 mg by mouth at bedtime.   unknown  . simvastatin (ZOCOR) 40 MG tablet Take 40 mg by mouth at bedtime.     unknown    Assessment: 82 year old man with a PMH of DM, HTN, HLD, and lung cancer s/p lobectomy in 2008 referred to the emergency department by his PCP today for abnormal heart rhythm. Initial evaluation revealed atrial fibrillation with rapid ventricular response as well as reported chest pain. Rate control was quickly achieved, patient remains in atrial fibrillation. Start anticoagulation. Goal of Therapy:  Full dose anticoagulation with Eliquis Monitor platelets by anticoagulation protocol: Yes   Plan:  Eliquis '5mg'$  PO bid Monitor CBC, s/sx bleeding complications Provide education  [name] 08/26/2016,11:27 AM

## 2016-08-26 NOTE — Care Management Obs Status (Signed)
[place] NOTIFICATION   Patient Details  Name: [name] MRN: [id number] Date of Birth: February 02, 1927   Medicare Observation Status Notification Given:  Yes    Sherald Barge, RN 08/26/2016, 8:54 AM

## 2016-08-26 NOTE — Progress Notes (Signed)
PROGRESS NOTE  [name] OEV:[id number] DOB: 05-10-1927 DOA: 08/25/2016 PCP: Purvis Kilts, MD  Brief Narrative: 82 year old man with a PMH of DM, HTN, HLD, and lung cancer s/p lobectomy in 2008 referred to the emergency department by his PCP today for abnormal heart rhythm. Initial evaluation revealed atrial fibrillation with rapid ventricular response as well as reported chest pain. Rate control was quickly achieved, patient remains in atrial fibrillation. Start anticoagulation. Plan discharge home today, will arrange outpatient follow-up with cardiology.  Assessment/Plan: 1. Atrial fibrillation with rapid ventricular response. Now rate controlled, asymptomatic. Troponins negative. EKG non-acute.  CHA2DS2-VASc score of 5. discussed risk/benefit of anticoagulation, warfarin versus newer agents. Patient elects Eiquis, and understands the risks/benefits of this age 82. Chest pain. Nearly resolved. CT angiogram of chest was negative for PE or acute pulmonary process. EKG nonacute. Troponins negative. Atypical, tends to be pleuritic in nature. No evidence of ACS. He does have a three-vessel disease by CT, significance of this is unclear, risk factors include diabetes, hypertension, and hyperlipidemia. 3. 3 vessel atherosclerotic coronary artery disease by CT. appears asymptomatic. 4. Diabetes mellitus type 2, stable. 5. Hyperlipidemia. Continue statin. 6. Lung cancer, status post partial lobectomy 2008.  7. Irregular soft tissue mass see on CT today: along the medial aspect of the anterior right upper lobe surgical site just medial to the radiation seeds measuring 3.8 x 8.1 cm likely representing local lung cancer recurrence. Worsening mediastinal and hilar adenopathy likely metastatic nodal disease. 8. Aortic atherosclerosis 9. Stable aneurysmal dilatation of the ascending thoracic aorta measuring 4.2 cm. Recommend semi-annual imaging followup by CTA or MRA and referral to cardiothoracic  surgery if not already obtained   Appears stable. Check echocardiogram to complete workup. Start Eliquis.   Increase beta blocker.  Will schedule a follow-up with oncologist Dr. Julien Nordmann as an outpatient to review abnormal CT findings.   Discharge later today.   DVT prophylaxis: Eliquis Code Status: DNR  Family Communication: No family bedside Disposition Plan: Discharge home later today   Murray Hodgkins, MD  [facility] Direct contact: [phone] --Via amion app OR  --[email or web address]; password [email or web address]  7PM-7AM contact night coverage as above 08/26/2016, 5:57 AM  LOS: 0 days   Consultants:  None   Procedures:  None   Antimicrobials:  None   CC: Follow-up Afib   Interval history/Subjective: Fells well. Slept well. Chest pain has improved although he still has mild pain with deep breathing. Reports a cough with congestion.   ROS: No nausea or vomiting   Objective: Vitals:   08/26/16 0330 08/26/16 0345 08/26/16 0400 08/26/16 0500  BP: (!) 106/56 107/63 103/68 106/68  Pulse: 89 63 (!) 56 (!) 102  Resp: (!) 26 20 (!) 25 (!) 25  Temp:   98 F (36.7 C)   TempSrc:   Oral   SpO2: 93% 93% 93% 95%  Weight:   112.5 kg (248 lb 0.3 oz)   Height:        Intake/Output Summary (Last 24 hours) at 08/26/16 0557 Last data filed at 08/26/16 0000  Gross per 24 hour  Intake          1537.67 ml  Output             1220 ml  Net           317.67 ml     Filed Weights   08/25/16 0942 08/25/16 1433 08/26/16 0400  Weight: 119.3 kg (263 lb) 111.5 kg (245 lb 13 oz) 112.5 kg (  248 lb 0.3 oz)    Exam:    Constitutional:  . Appears calm and comfortable Respiratory:  . CTA bilaterally, no w/r/r.  . Respiratory effort normal. No retractions or accessory muscle use Cardiovascular:  . Irregular, no m/r/g . 1+ bilateral LE extremity edema   . Telemetry Afib    I have personally reviewed following labs and imaging studies:  Troponins negative  TSH WNL  Scheduled  Meds: . alfuzosin  10 mg Oral Daily  . aspirin EC  81 mg Oral Daily  . enoxaparin (LOVENOX) injection  1 mg/kg Subcutaneous Q12H  . glipiZIDE  5 mg Oral BID AC  . losartan  100 mg Oral Daily  . metoprolol tartrate  25 mg Oral BID  . oxybutynin  10 mg Oral QHS  . simvastatin  40 mg Oral QHS   Continuous Infusions: . diltiazem (CARDIZEM) infusion Stopped (08/25/16 2330)    Principal Problem:   Atrial fibrillation with rapid ventricular response (HCC) Active Problems:   Lung mass   Chest pain   DM type 2 (diabetes mellitus, type 2) ([place])   Aortic atherosclerosis ([place])   Thoracic aortic aneurysm ([place])   LOS: 0 days        By signing my name below, I, Collene Leyden, attest that this documentation has been prepared under the direction and in the presence of Murray Hodgkins, MD. Electronically signed: Collene Leyden, Scribe. 08/26/16 8:57 AM   I personally performed the services described in this documentation. All medical record entries made by the scribe were at my direction. I have reviewed the chart and agree that the record reflects my personal performance and is accurate and complete. Murray Hodgkins, MD

## 2016-08-26 NOTE — Discharge Summary (Signed)
Physician Discharge Summary  [name] GTX:[id number] DOB: 08/20/1927 DOA: 08/25/2016  PCP: Purvis Kilts, MD  Admit date: 08/25/2016 Discharge date: 08/26/2016  Recommendations for Outpatient Follow-up:  1. Follow-up new diagnosis of atrial fibrillation. Started on metoprolol and Eliquis 2. Follow-up apparently asymptomatic three-vessel coronary artery disease by CT 3. Follow-up mild systolic dysfunction 4. Follow-up irregular soft tissue mass seen on chest CT concerning for local cancer recurrence. Epic message sent to Dr. Mayme Genta to arrange outpatient follow-up. 5. Stable aneurysmal dilatation of the ascending thoracic aorta, consider semiannual imaging by CTA or MRA. Consider referral to cardiothoracic surgery.   Follow-up Information    Purvis Kilts, MD. Schedule an appointment as soon as possible for a visit in 1 week(s).   Specialty:  Family Medicine Contact information: [address] [place] [address] [phone]        Rozann Lesches, MD Follow up on 09/26/2016.   Specialty:  Cardiology Why:  9:00 Contact information: [place] [address] [phone]          Discharge Diagnoses:  1. Atrial fibrillation with rapid ventricular response 2. Atypical chest pain 3. 3 vessel atherosclerotic CAD by CT, asymptomatic 4. DM type 2  5. Past medical history of lung cancer 6. Irregular soft tissue lung mass of unclear significance 7. Aortic atherosclerosis 8. Aneurysmal dilatation of ascending thoracic aorta   Discharge Condition: Improved  Disposition: Home   Diet recommendation: Carb modified   Filed Weights   08/25/16 0942 08/25/16 1433 08/26/16 0400  Weight: 119.3 kg (263 lb) 111.5 kg (245 lb 13 oz) 112.5 kg (248 lb 0.3 oz)    History of present illness:  82 year old man with a PMH of DM, HTN, HLD, and lung cancer s/p lobectomy in 2008 referred to the emergency department by his PCP today for abnormal heart  rhythm. Initial evaluation revealed atrial fibrillation with rapid ventricular response as well as reported chest pain.   Hospital Course:  Patient was admitted with atrial fibrillation with rapid ventricular response. Rate control was rapidly achieved with IV diltiazem and he was transitioned to metoprolol. He remains in atrial fibrillation. Anticoagulation discussed with the patient, discussed risk/benefit of newer agents versus warfarin. He elected to start Eliquis. His hospitalization was uncomplicated and he has overall improved.  Other significant findings as below.  1. Atrial fibrillation with rapid ventricular response. Now rate controlled, asymptomatic. Troponins negative. EKG non-acute.  CHA2DS2-VASc score of5. discussed risk/benefit of anticoagulation, warfarin versus newer agents. Patient elects Eiquis, and understands the risks/benefits of this age 82. Chest pain. Nearly resolved. CT angiogram of chest was negative for PE or acute pulmonary process. EKG nonacute. Troponins negative. Atypical, tends to be pleuritic in nature. No evidence of ACS. He does have a three-vessel disease by CT, significance of this is unclear, risk factors include diabetes, hypertension, and hyperlipidemia. 3. 3 vessel atherosclerotic coronary artery disease by CT. appears asymptomatic. 4. Diabetes mellitus type 2, stable. 5. Hyperlipidemia. Continue statin. 6. Lung cancer, status post partial lobectomy 2008.  7. Irregular soft tissue mass see on CT today: along the medial aspect of the anterior right upper lobe surgical site just medial to the radiation seeds measuring 3.8 x 8.1 cm likely representing local lung cancer recurrence. Worsening mediastinal and hilar adenopathy likely metastatic nodal disease. 8. Aortic atherosclerosis 9. Stable aneurysmal dilatation of the ascending thoracic aorta measuring 4.2 cm. Recommend semi-annual imaging followup by CTA or MRA and referral to cardiothoracic surgery if not  already  obtained  Consultants:  None   Procedures:  2-D echo Impressions:  - Mild LVH with LVEF approximately 45-50% in the setting of atrial   fibrillation. Indeterminate diastolic function. Mild left atrial   enlargement. Severe MAC with trivial mitral regurgitation. Mild   calcific aortic stenosis with trivial aortic regurgitation.   Mildly dilated aortic root. Mild tricuspid regurgitation with   PASP 39 mmHg. Small posterior pericardial effusion.  Antimicrobials:  None   Discharge Instructions  Discharge Instructions    Activity as tolerated - No restrictions    Complete by:  As directed    Diet - low sodium heart healthy    Complete by:  As directed    Diet Carb Modified    Complete by:  As directed    Discharge instructions    Complete by:  As directed    Call your physician or seek immediate medical attention for chest pain, shortness of breath, palpitations, heart racing, dizziness, weakness, bleeding or worsening of condition.       Medication List    STOP taking these medications   aspirin 81 MG EC tablet     TAKE these medications   apixaban 5 MG Tabs tablet Commonly known as:  ELIQUIS Take 1 tablet (5 mg total) by mouth 2 (two) times daily.   Fish Oil 1000 MG Caps Take by mouth.   folic acid 735 MCG tablet Commonly known as:  FOLVITE Take 800 mcg by mouth daily.   glipiZIDE 5 MG tablet Commonly known as:  GLUCOTROL Take 5 mg by mouth 2 (two) times daily before a meal.   JANUMET 50-1000 MG tablet Generic drug:  sitaGLIPtin-metformin 1 tablet 2 (two) times daily.   losartan 100 MG tablet Commonly known as:  COZAAR Take 100 mg by mouth daily.   metoprolol 50 MG tablet Commonly known as:  LOPRESSOR Take 1 tablet (50 mg total) by mouth 2 (two) times daily.   oxybutynin 10 MG 24 hr tablet Commonly known as:  DITROPAN-XL Take 10 mg by mouth at bedtime.   simvastatin 40 MG tablet Commonly known as:  ZOCOR Take 40 mg by mouth at  bedtime.   UROXATRAL 10 MG 24 hr tablet Generic drug:  alfuzosin Take 10 mg by mouth daily.      Allergies  Allergen Reactions  . Sulfa Antibiotics Rash    The results of significant diagnostics from this hospitalization (including imaging, microbiology, ancillary and laboratory) are listed below for reference.    Significant Diagnostic Studies: Dg Chest 2 View  Result Date: 08/25/2016 CLINICAL DATA:  Chest pain.  History of lung carcinoma EXAM: CHEST  2 VIEW COMPARISON:  February 17, 2011 chest radiograph; chest CT October 29, 2014 FINDINGS: There is postoperative change on the right. There is scarring in the right mid and lower lung zones. There is a small right pleural effusion. There is mild scarring in the left lung base. Left lung is otherwise clear. Heart is upper normal in size with pulmonary vascularity within normal limits. No adenopathy. There is atherosclerotic calcification in the aorta. Bones appear somewhat osteoporotic. No blastic or lytic bone lesions are evident. IMPRESSION: Postoperative change and scarring on the right. Small right pleural effusion. Mild scarring left base. No edema or consolidation. Stable cardiac silhouette. Aortic atherosclerosis. No adenopathy evident. Electronically Signed   By: Lowella Grip III M.D.   On: 08/25/2016 10:38   Ct Angio Chest Pe W Or Wo Contrast  Result Date: 08/25/2016 CLINICAL DATA:  Sudden-onset central  chest pain worse with deep inspiration. Shortness of breath and tachycardia. History of right lung cancer with radiation seed implants. EXAM: CT ANGIOGRAPHY CHEST WITH CONTRAST TECHNIQUE: Multidetector CT imaging of the chest was performed using the standard protocol during bolus administration of intravenous contrast. Multiplanar CT image reconstructions and MIPs were obtained to evaluate the vascular anatomy. CONTRAST:  100 mL Isovue 370 IV. COMPARISON:  CT 10/29/2014 and 10/29/2013 and 10/30/2012 FINDINGS: Cardiovascular: Mild  cardiomegaly. Small pericardial effusion which is slightly worse and measures 1 cm in thickness anterior to the right ventricle. Mild calcification of the mitral valve annulus. Calcified plaque over the coronary arteries. Calcified plaque involving the thoracic aorta. Minimal aneurysmal dilatation of the ascending thoracic aorta measuring 4.2 cm in AP diameter unchanged. Pulmonary arterial system is within normal without evidence of emboli. Mediastinum/Nodes: 1 cm right peritracheal and AP window lymph nodes slightly larger. 1.1 cm precarinal lymph node slightly larger. Slight increase in size of the remaining sub cm mediastinal lymph nodes. Increase sub carinal adenopathy measuring 2.1 cm by short axis. Mild bilateral hilar adenopathy. Couple tiny sub cm right pericardial phrenic lymph nodes. Remaining mediastinal structures are unremarkable. Lungs/Pleura: Lungs are adequately inflated. Radiation seed implants are present over the sub pleural region of the anterior right upper lobe likely representing the original site of patient's right lung cancer. There has been interval development of an irregular soft tissue mass along the medial aspect of the surgical site/radiation seeds measuring 3.8 x 8.1 cm in AP and transverse dimension. This likely represents local recurrence of patient's lung cancer. There is a new small right pleural effusion and tiny amount of left pleural fluid. Stable to slight increased prominence reticulonodular density over the right upper lobe. Focal scarring along the minor fissure unchanged. Mild posterior bibasilar dependent atelectasis. Focal 1 cm pleural-based nodularity over the left lower lobe more prominent. 1.5 cm focal pleural nodularity over the lateral right lower lobe likely pleural metastatic disease. Slight bronchiectatic change in the left lower lobe. Upper Abdomen: Sub cm hypodensity over the left lobe of the liver unchanged. Partially visualized cyst over the upper pole right  kidney unchanged. Musculoskeletal: Fairly well-defined lytic lesion over a mid thoracic vertebral body unchanged from 2013 likely a hemangioma. Review of the MIP images confirms the above findings. IMPRESSION: No evidence of pulmonary embolism. Postsurgical change with radiation seed implants over the subpleural location of the anterior right upper lobe compatible with known history of lung cancer. Interval development of irregular soft tissue mass along the medial aspect of the anterior right upper lobe surgical site just medial to the radiation seeds measuring 3.8 x 8.1 cm likely representing local lung cancer recurrence. Small bilateral pleural effusions right greater than left with associated basilar atelectasis. Worsening mediastinal and hilar adenopathy likely metastatic nodal disease. Subtle reticulonodular opacification over the right upper lobe unchanged to slightly more prominent. 1 cm pleural-based nodule opacity over the left lower lobe slightly more prominent. 1.5 cm focal pleural nodularity over the lateral right lower lobe which may be focal fluid versus metastatic pleural disease. Recommend attention on follow-up as this may represent metastatic disease. Mild cardiomegaly with small pericardial effusion as the fusion is slightly worse. Three vessel atherosclerotic coronary artery disease. Aortic atherosclerosis. Stable aneurysmal dilatation of the ascending thoracic aorta measuring 4.2 cm. Recommend semi-annual imaging followup by CTA or MRA and referral to cardiothoracic surgery if not already obtained. This recommendation follows 2010 ACCF/AHA/AATS/ACR/ASA/SCA/SCAI/SIR/STS/SVM Guidelines for the Diagnosis and Management of Patients With Thoracic Aortic Disease.  Circulation. 2010; 121: e266-e36. Electronically Signed   By: Marin Olp M.D.   On: 08/25/2016 12:29    Microbiology: Recent Results (from the past 240 hour(s))  MRSA PCR Screening     Status: None   Collection Time: 08/25/16  2:31  PM  Result Value Ref Range Status   MRSA by PCR NEGATIVE NEGATIVE Final    Comment:        The GeneXpert MRSA Assay (FDA approved for NASAL specimens only), is one component of a comprehensive MRSA colonization surveillance program. It is not intended to diagnose MRSA infection nor to guide or monitor treatment for MRSA infections.      Labs: Basic Metabolic Panel:  Recent Labs Lab 08/25/16 0948  NA 135  K 4.0  CL 100*  CO2 26  GLUCOSE 171*  BUN 19  CREATININE 0.80  CALCIUM 10.3   CBC:  Recent Labs Lab 08/25/16 0948  WBC 12.4*  HGB 13.0  HCT 40.0  MCV 83.9  PLT 212   Cardiac Enzymes:  Recent Labs Lab 08/25/16 0948 08/25/16 1613 08/25/16 2128 08/26/16 0327  TROPONINI <0.03 <0.03 <0.03 <0.03    Principal Problem:   Atrial fibrillation with rapid ventricular response (HCC) Active Problems:   Lung mass   Chest pain   DM type 2 (diabetes mellitus, type 2) ([place])   Aortic atherosclerosis ([place])   Thoracic aortic aneurysm ([place])   Time coordinating discharge: 35 minutes   Signed:  Murray Hodgkins, MD [facility] 08/26/2016, 5:21 PM   By signing my name below, I, Collene Leyden, attest that this documentation has been prepared under the direction and in the presence of Murray Hodgkins, MD. Electronically signed: Collene Leyden, Scribe. 08/26/16 8:57 AM   I personally performed the services described in this documentation. All medical record entries made by the scribe were at my direction. I have reviewed the chart and agree that the record reflects my personal performance and is accurate and complete. Murray Hodgkins, MD

## 2016-08-28 ENCOUNTER — Telehealth: Payer: Self-pay | Admitting: Internal Medicine

## 2016-08-28 NOTE — Telephone Encounter (Signed)
Called to make appt within next 2 - 3 wks. There was no answer and no vm. No appt made.

## 2016-08-29 ENCOUNTER — Telehealth: Payer: Self-pay | Admitting: Internal Medicine

## 2016-08-29 NOTE — Telephone Encounter (Signed)
Unable to reach pt to inform of Oct appt date/time. Letter sent by mail 10/2

## 2016-08-30 DIAGNOSIS — I4891 Unspecified atrial fibrillation: Secondary | ICD-10-CM | POA: Diagnosis not present

## 2016-08-30 DIAGNOSIS — E6609 Other obesity due to excess calories: Secondary | ICD-10-CM | POA: Diagnosis not present

## 2016-08-30 DIAGNOSIS — I712 Thoracic aortic aneurysm, without rupture: Secondary | ICD-10-CM | POA: Diagnosis not present

## 2016-08-30 DIAGNOSIS — I5020 Unspecified systolic (congestive) heart failure: Secondary | ICD-10-CM | POA: Diagnosis not present

## 2016-08-30 DIAGNOSIS — Z1389 Encounter for screening for other disorder: Secondary | ICD-10-CM | POA: Diagnosis not present

## 2016-08-30 DIAGNOSIS — Z6836 Body mass index (BMI) 36.0-36.9, adult: Secondary | ICD-10-CM | POA: Diagnosis not present

## 2016-09-13 ENCOUNTER — Other Ambulatory Visit: Payer: Self-pay | Admitting: *Deleted

## 2016-09-13 DIAGNOSIS — R918 Other nonspecific abnormal finding of lung field: Secondary | ICD-10-CM

## 2016-09-14 ENCOUNTER — Other Ambulatory Visit (HOSPITAL_BASED_OUTPATIENT_CLINIC_OR_DEPARTMENT_OTHER): Payer: Medicare Other

## 2016-09-14 ENCOUNTER — Telehealth: Payer: Self-pay | Admitting: Internal Medicine

## 2016-09-14 ENCOUNTER — Encounter: Payer: Self-pay | Admitting: Internal Medicine

## 2016-09-14 ENCOUNTER — Ambulatory Visit (HOSPITAL_BASED_OUTPATIENT_CLINIC_OR_DEPARTMENT_OTHER): Payer: Medicare Other | Admitting: Internal Medicine

## 2016-09-14 VITALS — BP 141/80 | HR 62 | Temp 98.7°F | Resp 18 | Ht 69.0 in | Wt 250.2 lb

## 2016-09-14 DIAGNOSIS — Z85118 Personal history of other malignant neoplasm of bronchus and lung: Secondary | ICD-10-CM

## 2016-09-14 DIAGNOSIS — R918 Other nonspecific abnormal finding of lung field: Secondary | ICD-10-CM

## 2016-09-14 DIAGNOSIS — R591 Generalized enlarged lymph nodes: Secondary | ICD-10-CM

## 2016-09-14 LAB — COMPREHENSIVE METABOLIC PANEL
ALBUMIN: 3.6 g/dL (ref 3.5–5.0)
ALK PHOS: 49 U/L (ref 40–150)
ALT: 9 U/L (ref 0–55)
AST: 12 U/L (ref 5–34)
Anion Gap: 9 mEq/L (ref 3–11)
BILIRUBIN TOTAL: 0.64 mg/dL (ref 0.20–1.20)
BUN: 12 mg/dL (ref 7.0–26.0)
CO2: 24 mEq/L (ref 22–29)
CREATININE: 0.8 mg/dL (ref 0.7–1.3)
Calcium: 9.9 mg/dL (ref 8.4–10.4)
Chloride: 99 mEq/L (ref 98–109)
EGFR: 81 mL/min/{1.73_m2} — AB (ref >90)
GLUCOSE: 90 mg/dL (ref 70–140)
Potassium: 4.3 mEq/L (ref 3.5–5.1)
SODIUM: 131 meq/L — AB (ref 136–145)
TOTAL PROTEIN: 6.6 g/dL (ref 6.4–8.3)

## 2016-09-14 LAB — CBC WITH DIFFERENTIAL/PLATELET
BASO%: 0.1 % (ref 0.0–2.0)
Basophils Absolute: 0 10*3/uL (ref 0.0–0.1)
EOS ABS: 0.1 10*3/uL (ref 0.0–0.5)
EOS%: 0.6 % (ref 0.0–7.0)
HCT: 38.9 % (ref 38.4–49.9)
HEMOGLOBIN: 12.8 g/dL — AB (ref 13.0–17.1)
LYMPH%: 19.9 % (ref 14.0–49.0)
MCH: 26.7 pg — ABNORMAL LOW (ref 27.2–33.4)
MCHC: 32.9 g/dL (ref 32.0–36.0)
MCV: 81 fL (ref 79.3–98.0)
MONO#: 0.6 10*3/uL (ref 0.1–0.9)
MONO%: 6.9 % (ref 0.0–14.0)
NEUT%: 72.5 % (ref 39.0–75.0)
NEUTROS ABS: 6.3 10*3/uL (ref 1.5–6.5)
Platelets: 205 10*3/uL (ref 140–400)
RBC: 4.8 10*6/uL (ref 4.20–5.82)
RDW: 14.6 % (ref 11.0–14.6)
WBC: 8.6 10*3/uL (ref 4.0–10.3)
lymph#: 1.7 10*3/uL (ref 0.9–3.3)

## 2016-09-14 NOTE — Telephone Encounter (Signed)
M.D. Wants PET scan by 09/21/16. Patient instructed to call  [facility] if not heard from them by Friday, 09/16/16 as per patient phone has no voice mail. Follow up appt scheduled for 09/23/16, per 10/178/17 los. Avs report and appointment schedule given to patient per 09/14/16 los.

## 2016-09-14 NOTE — Progress Notes (Signed)
[place] [phone]   [phone]  OFFICE PROGRESS NOTE  Purvis Kilts, MD [address] [place] [address]  DIAGNOSIS: Stage IA non-small cell lung cancer, moderately differentiated adenocarcinoma, diagnosed in April 2008.   PRIOR THERAPY: Status post wedge resection of the right upper lobe with seed implants under the care of Dr Arlyce Dice on October 13, 2007.   CURRENT THERAPY: Observation.  INTERVAL HISTORY: [name] 82 y.o. male returns to the clinic today for followup visit accompanied by a family member. The patient was last seen more than 2 years ago. He was followed by his primary care physician. Recently he has been complaining of chest congestion as well as cough productive of yellowish sputum and bilateral chest pain. CT angiogram of the chest was performed on 08/25/2016 and showed findings suspicious for recurrence of lung cancer. He was referred to me today for evaluation and recommendation regarding his condition. He denied having any significant hemoptysis. He denied having any significant weight loss or night sweats.   MEDICAL HISTORY: Past Medical History:  Diagnosis Date  . Colon polyps   . Diabetes mellitus   . Enlarged prostate   . History of kidney stones   . Hypercholesteremia   . Hypertension   . lung ca dx'd 01/2007   seed implants  . Thoracic aortic aneurysm ([place]) 08/25/2016  . Tobacco abuse     ALLERGIES:  is allergic to sulfa antibiotics.  MEDICATIONS:  Current Outpatient Prescriptions  Medication Sig Dispense Refill  . alfuzosin (UROXATRAL) 10 MG 24 hr tablet Take 10 mg by mouth daily.      Marland Kitchen apixaban (ELIQUIS) 5 MG TABS tablet Take 1 tablet (5 mg total) by mouth 2 (two) times daily. 60 tablet 1  . folic acid (FOLVITE) 824 MCG tablet Take 800 mcg by mouth daily.    Marland Kitchen glipiZIDE (GLUCOTROL) 5 MG tablet Take 5 mg by mouth 2 (two) times daily before a meal.    . JANUMET 50-1000 MG tablet 1  tablet 2 (two) times daily.    Marland Kitchen losartan (COZAAR) 100 MG tablet Take 100 mg by mouth daily.    . metoprolol (LOPRESSOR) 50 MG tablet Take 1 tablet (50 mg total) by mouth 2 (two) times daily. 60 tablet 0  . Omega-3 Fatty Acids (FISH OIL) 1000 MG CAPS Take by mouth.    . oxybutynin (DITROPAN-XL) 10 MG 24 hr tablet Take 10 mg by mouth at bedtime.    . simvastatin (ZOCOR) 40 MG tablet Take 40 mg by mouth at bedtime.       No current facility-administered medications for this visit.     SURGICAL HISTORY:  Past Surgical History:  Procedure Laterality Date  . APPENDECTOMY    . CERVICAL SPINE SURGERY    . CHOLECYSTECTOMY    . COLONOSCOPY  04/17/2012   Procedure: COLONOSCOPY;  Surgeon: Jamesetta So, MD;  Location: [facility];  Service: Gastroenterology;  Laterality: N/A;  . COLONOSCOPY W/ POLYPECTOMY    . TONSILLECTOMY    . wedge resection with seed implantation and node sampling with right VATS  03/13/2007    REVIEW OF SYSTEMS:  Constitutional: positive for fatigue Eyes: negative Ears, nose, mouth, throat, and face: negative Respiratory: positive for cough, dyspnea on exertion and sputum Cardiovascular: negative Gastrointestinal: negative Genitourinary:negative Integument/breast: negative Hematologic/lymphatic: negative Musculoskeletal:negative Neurological: negative Behavioral/Psych: negative Endocrine: negative Allergic/Immunologic: negative   PHYSICAL EXAMINATION: General appearance: alert and fatigued Head: Normocephalic, without obvious  abnormality, atraumatic Neck: no adenopathy Lymph nodes: Cervical, supraclavicular, and axillary nodes normal. Resp: wheezes bilaterally Back: symmetric, no curvature. ROM normal. No CVA tenderness. Cardio: regular rate and rhythm, S1, S2 normal, no murmur, click, rub or gallop GI: soft, non-tender; bowel sounds normal; no masses,  no organomegaly Extremities: extremities normal, atraumatic, no cyanosis or edema Neurologic: Alert and  oriented X 3, normal strength and tone. Normal symmetric reflexes. Normal coordination and gait  ECOG PERFORMANCE STATUS: 1 - Symptomatic but completely ambulatory  Blood pressure (!) 141/80, pulse 62, temperature 98.7 F (37.1 C), temperature source Oral, resp. rate 18, height '5\' 9"'$  (1.753 m), weight 250 lb 3.2 oz (113.5 kg), SpO2 96 %.  LABORATORY DATA: Lab Results  Component Value Date   WBC 8.6 09/14/2016   HGB 12.8 (L) 09/14/2016   HCT 38.9 09/14/2016   MCV 81.0 09/14/2016   PLT 205 09/14/2016      Chemistry      Component Value Date/Time   NA 135 08/25/2016 0948   NA 139 10/29/2014 0759   K 4.0 08/25/2016 0948   K 4.3 10/29/2014 0759   CL 100 (L) 08/25/2016 0948   CL 104 10/30/2012 0805   CO2 26 08/25/2016 0948   CO2 22 10/29/2014 0759   BUN 19 08/25/2016 0948   BUN 22.1 10/29/2014 0759   CREATININE 0.80 08/25/2016 0948   CREATININE 0.8 10/29/2014 0759      Component Value Date/Time   CALCIUM 10.3 08/25/2016 0948   CALCIUM 10.4 10/29/2014 0759   ALKPHOS 50 10/29/2014 0759   AST 12 10/29/2014 0759   ALT 19 10/29/2014 0759   BILITOT 0.34 10/29/2014 0759       RADIOGRAPHIC STUDIES: Dg Chest 2 View  Result Date: 08/25/2016 CLINICAL DATA:  Chest pain.  History of lung carcinoma EXAM: CHEST  2 VIEW COMPARISON:  February 17, 2011 chest radiograph; chest CT October 29, 2014 FINDINGS: There is postoperative change on the right. There is scarring in the right mid and lower lung zones. There is a small right pleural effusion. There is mild scarring in the left lung base. Left lung is otherwise clear. Heart is upper normal in size with pulmonary vascularity within normal limits. No adenopathy. There is atherosclerotic calcification in the aorta. Bones appear somewhat osteoporotic. No blastic or lytic bone lesions are evident. IMPRESSION: Postoperative change and scarring on the right. Small right pleural effusion. Mild scarring left base. No edema or consolidation. Stable  cardiac silhouette. Aortic atherosclerosis. No adenopathy evident. Electronically Signed   By: Lowella Grip III M.D.   On: 08/25/2016 10:38   Ct Angio Chest Pe W Or Wo Contrast  Result Date: 08/25/2016 CLINICAL DATA:  Sudden-onset central chest pain worse with deep inspiration. Shortness of breath and tachycardia. History of right lung cancer with radiation seed implants. EXAM: CT ANGIOGRAPHY CHEST WITH CONTRAST TECHNIQUE: Multidetector CT imaging of the chest was performed using the standard protocol during bolus administration of intravenous contrast. Multiplanar CT image reconstructions and MIPs were obtained to evaluate the vascular anatomy. CONTRAST:  100 mL Isovue 370 IV. COMPARISON:  CT 10/29/2014 and 10/29/2013 and 10/30/2012 FINDINGS: Cardiovascular: Mild cardiomegaly. Small pericardial effusion which is slightly worse and measures 1 cm in thickness anterior to the right ventricle. Mild calcification of the mitral valve annulus. Calcified plaque over the coronary arteries. Calcified plaque involving the thoracic aorta. Minimal aneurysmal dilatation of the ascending thoracic aorta measuring 4.2 cm in AP diameter unchanged. Pulmonary arterial system is within normal without  evidence of emboli. Mediastinum/Nodes: 1 cm right peritracheal and AP window lymph nodes slightly larger. 1.1 cm precarinal lymph node slightly larger. Slight increase in size of the remaining sub cm mediastinal lymph nodes. Increase sub carinal adenopathy measuring 2.1 cm by short axis. Mild bilateral hilar adenopathy. Couple tiny sub cm right pericardial phrenic lymph nodes. Remaining mediastinal structures are unremarkable. Lungs/Pleura: Lungs are adequately inflated. Radiation seed implants are present over the sub pleural region of the anterior right upper lobe likely representing the original site of patient's right lung cancer. There has been interval development of an irregular soft tissue mass along the medial aspect of  the surgical site/radiation seeds measuring 3.8 x 8.1 cm in AP and transverse dimension. This likely represents local recurrence of patient's lung cancer. There is a new small right pleural effusion and tiny amount of left pleural fluid. Stable to slight increased prominence reticulonodular density over the right upper lobe. Focal scarring along the minor fissure unchanged. Mild posterior bibasilar dependent atelectasis. Focal 1 cm pleural-based nodularity over the left lower lobe more prominent. 1.5 cm focal pleural nodularity over the lateral right lower lobe likely pleural metastatic disease. Slight bronchiectatic change in the left lower lobe. Upper Abdomen: Sub cm hypodensity over the left lobe of the liver unchanged. Partially visualized cyst over the upper pole right kidney unchanged. Musculoskeletal: Fairly well-defined lytic lesion over a mid thoracic vertebral body unchanged from 2013 likely a hemangioma. Review of the MIP images confirms the above findings. IMPRESSION: No evidence of pulmonary embolism. Postsurgical change with radiation seed implants over the subpleural location of the anterior right upper lobe compatible with known history of lung cancer. Interval development of irregular soft tissue mass along the medial aspect of the anterior right upper lobe surgical site just medial to the radiation seeds measuring 3.8 x 8.1 cm likely representing local lung cancer recurrence. Small bilateral pleural effusions right greater than left with associated basilar atelectasis. Worsening mediastinal and hilar adenopathy likely metastatic nodal disease. Subtle reticulonodular opacification over the right upper lobe unchanged to slightly more prominent. 1 cm pleural-based nodule opacity over the left lower lobe slightly more prominent. 1.5 cm focal pleural nodularity over the lateral right lower lobe which may be focal fluid versus metastatic pleural disease. Recommend attention on follow-up as this may  represent metastatic disease. Mild cardiomegaly with small pericardial effusion as the fusion is slightly worse. Three vessel atherosclerotic coronary artery disease. Aortic atherosclerosis. Stable aneurysmal dilatation of the ascending thoracic aorta measuring 4.2 cm. Recommend semi-annual imaging followup by CTA or MRA and referral to cardiothoracic surgery if not already obtained. This recommendation follows 2010 ACCF/AHA/AATS/ACR/ASA/SCA/SCAI/SIR/STS/SVM Guidelines for the Diagnosis and Management of Patients With Thoracic Aortic Disease. Circulation. 2010; 121: e266-e36. Electronically Signed   By: Marin Olp M.D.   On: 08/25/2016 12:29   ASSESSMENT AND PLAN: This is a very pleasant 82 years old white male with history of stage IA non-small cell lung cancer status post wedge resection of the right upper lobe with seed implants in November of 2008 and the patient has been observation since that time.  Unfortunately the CT angiogram of the chest performed on 08/25/2016 showed concerning findings for disease recurrence with large right upper lobe lung mass in addition to mediastinal lymphadenopathy and questionable pleural-based metastasis. I discussed the scan results with the patient and showed him the images today.  I recommended for the patient to have repeat PET scan for further evaluation of his disease. If the PET scan is  positive, I would consider the patient for CT-guided core biopsy of the right upper lobe lung mass or referral to cardiothoracic surgery for consideration of bronchoscopy and endobronchial ultrasound and biopsies. The patient would come back for follow-up visit in 2 weeks for evaluation and discussion of his treatment options. For the lower extremity edema and hypertension, the patient was advised to follow-up with his primary care physician for management of this condition. He was advised to call immediately if he has any concerning symptoms.  All questions were answered. The  patient knows to call the clinic with any problems, questions or concerns. We can certainly see the patient much sooner if necessary.  Disclaimer: This note was dictated with voice recognition software. Similar sounding words can inadvertently be transcribed and may be missed upon review.

## 2016-09-21 ENCOUNTER — Encounter (HOSPITAL_COMMUNITY)
Admission: RE | Admit: 2016-09-21 | Discharge: 2016-09-21 | Disposition: A | Discharge status: Home / Self Care | Payer: Medicare Other | Source: Ambulatory Visit | Attending: Internal Medicine | Admitting: Internal Medicine

## 2016-09-21 DIAGNOSIS — R918 Other nonspecific abnormal finding of lung field: Secondary | ICD-10-CM | POA: Insufficient documentation

## 2016-09-21 DIAGNOSIS — Z85118 Personal history of other malignant neoplasm of bronchus and lung: Secondary | ICD-10-CM | POA: Insufficient documentation

## 2016-09-21 DIAGNOSIS — C3490 Malignant neoplasm of unspecified part of unspecified bronchus or lung: Secondary | ICD-10-CM | POA: Diagnosis not present

## 2016-09-21 LAB — GLUCOSE, CAPILLARY: Glucose-Capillary: 122 mg/dL — ABNORMAL HIGH (ref 65–99)

## 2016-09-21 MED ORDER — FLUDEOXYGLUCOSE F - 18 (FDG) INJECTION
11.8600 | Freq: Once | INTRAVENOUS | Status: AC | PRN
  Administered 2016-09-21: 11.86 via INTRAVENOUS

## 2016-09-23 ENCOUNTER — Encounter: Payer: Self-pay | Admitting: Cardiology

## 2016-09-23 ENCOUNTER — Telehealth: Payer: Self-pay | Admitting: Internal Medicine

## 2016-09-23 ENCOUNTER — Ambulatory Visit (HOSPITAL_BASED_OUTPATIENT_CLINIC_OR_DEPARTMENT_OTHER): Payer: Medicare Other | Admitting: Internal Medicine

## 2016-09-23 ENCOUNTER — Encounter: Payer: Self-pay | Admitting: Internal Medicine

## 2016-09-23 VITALS — BP 163/87 | HR 78 | Temp 98.7°F | Resp 18 | Ht 69.0 in | Wt 247.7 lb

## 2016-09-23 DIAGNOSIS — Z85118 Personal history of other malignant neoplasm of bronchus and lung: Secondary | ICD-10-CM | POA: Diagnosis not present

## 2016-09-23 DIAGNOSIS — R079 Chest pain, unspecified: Secondary | ICD-10-CM

## 2016-09-23 DIAGNOSIS — R918 Other nonspecific abnormal finding of lung field: Secondary | ICD-10-CM | POA: Diagnosis not present

## 2016-09-23 DIAGNOSIS — I10 Essential (primary) hypertension: Secondary | ICD-10-CM

## 2016-09-23 DIAGNOSIS — R609 Edema, unspecified: Secondary | ICD-10-CM

## 2016-09-23 DIAGNOSIS — R0602 Shortness of breath: Secondary | ICD-10-CM

## 2016-09-23 NOTE — Telephone Encounter (Signed)
Appointments scheduled per 10/27 LOS. Patient given AVS report and calendars of future scheduled appointments. The patient was informed of CT Biopsy.

## 2016-09-23 NOTE — Progress Notes (Signed)
[place] [phone]   [phone]  OFFICE PROGRESS NOTE  Purvis Kilts, MD [address] [place] [address]  DIAGNOSIS: Stage IA non-small cell lung cancer, moderately differentiated adenocarcinoma, diagnosed in April 2008.   PRIOR THERAPY: Status post wedge resection of the right upper lobe with seed implants under the care of Dr Arlyce Dice on October 13, 2007.   CURRENT THERAPY: Observation.  INTERVAL HISTORY: [name] 82 y.o. male returns to the clinic today for followup visit. The patient is feeling fine today was no specific complaints except for mild shortness of breath and right-sided chest pain. He denied having any significant weight loss or night sweats. He has no nausea or vomiting. He has no hemoptysis. He was found on recent CT scan of the chest to have questionable disease recurrence in the right lung. I ordered a PET scan and the patient is here today for evaluation and discussion of his PET scan results.  MEDICAL HISTORY: Past Medical History:  Diagnosis Date  . Colon polyps   . Diabetes mellitus   . Enlarged prostate   . History of kidney stones   . Hypercholesteremia   . Hypertension   . lung ca dx'd 01/2007   seed implants  . Thoracic aortic aneurysm ([place]) 08/25/2016  . Tobacco abuse     ALLERGIES:  is allergic to sulfa antibiotics.  MEDICATIONS:  Current Outpatient Prescriptions  Medication Sig Dispense Refill  . alfuzosin (UROXATRAL) 10 MG 24 hr tablet Take 10 mg by mouth daily.      Marland Kitchen apixaban (ELIQUIS) 5 MG TABS tablet Take 1 tablet (5 mg total) by mouth 2 (two) times daily. 60 tablet 1  . folic acid (FOLVITE) 726 MCG tablet Take 800 mcg by mouth daily.    Marland Kitchen glipiZIDE (GLUCOTROL) 5 MG tablet Take 5 mg by mouth 2 (two) times daily before a meal.    . JANUMET 50-1000 MG tablet 1 tablet 2 (two) times daily.    Marland Kitchen losartan (COZAAR) 100 MG tablet Take 100 mg by mouth daily.    . metoprolol  (LOPRESSOR) 50 MG tablet Take 1 tablet (50 mg total) by mouth 2 (two) times daily. 60 tablet 0  . Omega-3 Fatty Acids (FISH OIL) 1000 MG CAPS Take by mouth.    . oxybutynin (DITROPAN-XL) 10 MG 24 hr tablet Take 10 mg by mouth at bedtime.    . simvastatin (ZOCOR) 40 MG tablet Take 40 mg by mouth at bedtime.       No current facility-administered medications for this visit.     SURGICAL HISTORY:  Past Surgical History:  Procedure Laterality Date  . APPENDECTOMY    . CERVICAL SPINE SURGERY    . CHOLECYSTECTOMY    . COLONOSCOPY  04/17/2012   Procedure: COLONOSCOPY;  Surgeon: Jamesetta So, MD;  Location: [facility];  Service: Gastroenterology;  Laterality: N/A;  . COLONOSCOPY W/ POLYPECTOMY    . TONSILLECTOMY    . wedge resection with seed implantation and node sampling with right VATS  03/13/2007    REVIEW OF SYSTEMS:  Constitutional: positive for fatigue Eyes: negative Ears, nose, mouth, throat, and face: negative Respiratory: positive for cough, dyspnea on exertion and sputum Cardiovascular: negative Gastrointestinal: negative Genitourinary:negative Integument/breast: negative Hematologic/lymphatic: negative Musculoskeletal:negative Neurological: negative Behavioral/Psych: negative Endocrine: negative Allergic/Immunologic: negative   PHYSICAL EXAMINATION: General appearance: alert and fatigued Head: Normocephalic, without obvious abnormality, atraumatic Neck: no adenopathy Lymph nodes: Cervical, supraclavicular, and axillary nodes  normal. Resp: wheezes bilaterally Back: symmetric, no curvature. ROM normal. No CVA tenderness. Cardio: regular rate and rhythm, S1, S2 normal, no murmur, click, rub or gallop GI: soft, non-tender; bowel sounds normal; no masses,  no organomegaly Extremities: extremities normal, atraumatic, no cyanosis or edema Neurologic: Alert and oriented X 3, normal strength and tone. Normal symmetric reflexes. Normal coordination and gait  ECOG  PERFORMANCE STATUS: 1 - Symptomatic but completely ambulatory  Blood pressure (!) 163/87, pulse 78, temperature 98.7 F (37.1 C), temperature source Oral, resp. rate 18, height '5\' 9"'$  (1.753 m), weight 247 lb 11.2 oz (112.4 kg), SpO2 96 %.  LABORATORY DATA: Lab Results  Component Value Date   WBC 8.6 09/14/2016   HGB 12.8 (L) 09/14/2016   HCT 38.9 09/14/2016   MCV 81.0 09/14/2016   PLT 205 09/14/2016      Chemistry      Component Value Date/Time   NA 131 (L) 09/14/2016 0738   K 4.3 09/14/2016 0738   CL 100 (L) 08/25/2016 0948   CL 104 10/30/2012 0805   CO2 24 09/14/2016 0738   BUN 12.0 09/14/2016 0738   CREATININE 0.8 09/14/2016 0738      Component Value Date/Time   CALCIUM 9.9 09/14/2016 0738   ALKPHOS 49 09/14/2016 0738   AST 12 09/14/2016 0738   ALT 9 09/14/2016 0738   BILITOT 0.64 09/14/2016 0738       RADIOGRAPHIC STUDIES: Dg Chest 2 View  Result Date: 08/25/2016 CLINICAL DATA:  Chest pain.  History of lung carcinoma EXAM: CHEST  2 VIEW COMPARISON:  February 17, 2011 chest radiograph; chest CT October 29, 2014 FINDINGS: There is postoperative change on the right. There is scarring in the right mid and lower lung zones. There is a small right pleural effusion. There is mild scarring in the left lung base. Left lung is otherwise clear. Heart is upper normal in size with pulmonary vascularity within normal limits. No adenopathy. There is atherosclerotic calcification in the aorta. Bones appear somewhat osteoporotic. No blastic or lytic bone lesions are evident. IMPRESSION: Postoperative change and scarring on the right. Small right pleural effusion. Mild scarring left base. No edema or consolidation. Stable cardiac silhouette. Aortic atherosclerosis. No adenopathy evident. Electronically Signed   By: Lowella Grip III M.D.   On: 08/25/2016 10:38   Ct Angio Chest Pe W Or Wo Contrast  Result Date: 08/25/2016 CLINICAL DATA:  Sudden-onset central chest pain worse with deep  inspiration. Shortness of breath and tachycardia. History of right lung cancer with radiation seed implants. EXAM: CT ANGIOGRAPHY CHEST WITH CONTRAST TECHNIQUE: Multidetector CT imaging of the chest was performed using the standard protocol during bolus administration of intravenous contrast. Multiplanar CT image reconstructions and MIPs were obtained to evaluate the vascular anatomy. CONTRAST:  100 mL Isovue 370 IV. COMPARISON:  CT 10/29/2014 and 10/29/2013 and 10/30/2012 FINDINGS: Cardiovascular: Mild cardiomegaly. Small pericardial effusion which is slightly worse and measures 1 cm in thickness anterior to the right ventricle. Mild calcification of the mitral valve annulus. Calcified plaque over the coronary arteries. Calcified plaque involving the thoracic aorta. Minimal aneurysmal dilatation of the ascending thoracic aorta measuring 4.2 cm in AP diameter unchanged. Pulmonary arterial system is within normal without evidence of emboli. Mediastinum/Nodes: 1 cm right peritracheal and AP window lymph nodes slightly larger. 1.1 cm precarinal lymph node slightly larger. Slight increase in size of the remaining sub cm mediastinal lymph nodes. Increase sub carinal adenopathy measuring 2.1 cm by short axis. Mild bilateral hilar  adenopathy. Couple tiny sub cm right pericardial phrenic lymph nodes. Remaining mediastinal structures are unremarkable. Lungs/Pleura: Lungs are adequately inflated. Radiation seed implants are present over the sub pleural region of the anterior right upper lobe likely representing the original site of patient's right lung cancer. There has been interval development of an irregular soft tissue mass along the medial aspect of the surgical site/radiation seeds measuring 3.8 x 8.1 cm in AP and transverse dimension. This likely represents local recurrence of patient's lung cancer. There is a new small right pleural effusion and tiny amount of left pleural fluid. Stable to slight increased prominence  reticulonodular density over the right upper lobe. Focal scarring along the minor fissure unchanged. Mild posterior bibasilar dependent atelectasis. Focal 1 cm pleural-based nodularity over the left lower lobe more prominent. 1.5 cm focal pleural nodularity over the lateral right lower lobe likely pleural metastatic disease. Slight bronchiectatic change in the left lower lobe. Upper Abdomen: Sub cm hypodensity over the left lobe of the liver unchanged. Partially visualized cyst over the upper pole right kidney unchanged. Musculoskeletal: Fairly well-defined lytic lesion over a mid thoracic vertebral body unchanged from 2013 likely a hemangioma. Review of the MIP images confirms the above findings. IMPRESSION: No evidence of pulmonary embolism. Postsurgical change with radiation seed implants over the subpleural location of the anterior right upper lobe compatible with known history of lung cancer. Interval development of irregular soft tissue mass along the medial aspect of the anterior right upper lobe surgical site just medial to the radiation seeds measuring 3.8 x 8.1 cm likely representing local lung cancer recurrence. Small bilateral pleural effusions right greater than left with associated basilar atelectasis. Worsening mediastinal and hilar adenopathy likely metastatic nodal disease. Subtle reticulonodular opacification over the right upper lobe unchanged to slightly more prominent. 1 cm pleural-based nodule opacity over the left lower lobe slightly more prominent. 1.5 cm focal pleural nodularity over the lateral right lower lobe which may be focal fluid versus metastatic pleural disease. Recommend attention on follow-up as this may represent metastatic disease. Mild cardiomegaly with small pericardial effusion as the fusion is slightly worse. Three vessel atherosclerotic coronary artery disease. Aortic atherosclerosis. Stable aneurysmal dilatation of the ascending thoracic aorta measuring 4.2 cm. Recommend  semi-annual imaging followup by CTA or MRA and referral to cardiothoracic surgery if not already obtained. This recommendation follows 2010 ACCF/AHA/AATS/ACR/ASA/SCA/SCAI/SIR/STS/SVM Guidelines for the Diagnosis and Management of Patients With Thoracic Aortic Disease. Circulation. 2010; 121: e266-e36. Electronically Signed   By: Marin Olp M.D.   On: 08/25/2016 12:29   Nm Pet Image Restag (ps) Skull Base To Thigh  Result Date: 09/21/2016 CLINICAL DATA:  Subsequent treatment strategy for restaging of lung cancer. Status post radiation seed implants. EXAM: NUCLEAR MEDICINE PET SKULL BASE TO THIGH TECHNIQUE: 11.9 mCi F-18 FDG was injected intravenously. Full-ring PET imaging was performed from the skull base to thigh after the radiotracer. CT data was obtained and used for attenuation correction and anatomic localization. FASTING BLOOD GLUCOSE:  Value: 122 mg/dl COMPARISON:  08/25/2016 chest CT.  PET of 11/16/2006. FINDINGS: NECK No areas of abnormal hypermetabolism. CHEST Hypermetabolism corresponding to the pleural-based anterior right upper lobe along the mass. This measures on the order of 8.3 x 4.2 cm and a S.U.V. max of 17.9 on image 72/ series 4. Multiple right-sided hypermetabolic pleural implants. Example measuring 1.3 cm and a S.U.V. max of 11.1 on image 89/series 4. Low right mediastinal and infrahilar hypermetabolic nodes, including at a S.U.V. max of 5.7 on image  82/ series 4. ABDOMEN/PELVIS No adrenal hypermetabolism. Colonic hypermetabolism is diffuse and likely physiologic. SKELETON No abnormal marrow activity. CT IMAGES PERFORMED FOR ATTENUATION CORRECTION No cervical adenopathy. Carotid atherosclerosis bilaterally. Chest findings deferred to recent diagnostic CT. Right-sided pleural effusion is minimally increased. Radiation seeds in the anterior right upper lobe. Cardiomegaly. Increase in moderate pericardial effusion. Multivessel coronary artery atherosclerosis. Pulmonary artery enlargement,  4.2 cm outflow tract. Bilateral large volume renal collecting system calculi. Bilateral low-density renal lesions are likely cysts. Advanced abdominal aortic and branch vessel atherosclerosis. Moderate prostatomegaly. Right-sided bladder calculi. Minimal chronic right-sided caliectasis is similar and likely relates to a mild ureteropelvic junction obstruction. Large colonic stool burden, especially distally. IMPRESSION: 1. Pleural-based right upper lobe lung hypermetabolic mass which is favored to represent a metachronous primary bronchogenic carcinoma. 2. Right pleural and thoracic nodal metastasis. 3. No hypermetabolic extrathoracic metastasis identified. 4. Incidental findings, including urinary tract calculi, chronic right ureteropelvic junction obstruction, and atherosclerosis. 5. Enlargement of a moderate pericardial and small right pleural effusion. 6. Pulmonary artery enlargement suggests pulmonary arterial hypertension. Electronically Signed   By: Abigail Miyamoto M.D.   On: 09/21/2016 09:28   ASSESSMENT AND PLAN: This is a very pleasant 82 years old white male with history of stage IA non-small cell lung cancer status post wedge resection of the right upper lobe with seed implants in November of 2008 and the patient has been observation since that time.  Unfortunately the CT angiogram of the chest performed on 08/25/2016 showed concerning findings for disease recurrence with large right upper lobe lung mass in addition to mediastinal lymphadenopathy and questionable pleural-based metastasis. Unfortunately the PET scan showed clear evidence for disease recurrence with large hypermetabolic pleural based right upper lobe lung mass in addition to right pleural and thoracic nodal metastasis. I discussed the PET scan results and showed the images to the patient today. I recommended for him to proceed with CT-guided core biopsy of the right upper lobe lung mass by interventional radiology. If the final  pathology is consistent with adenocarcinoma, we will send the tissue block to [facility] for molecular studies and PDL 1 expression. The patient would come back for follow-up visit in 2 weeks for evaluation and discussion of his treatment options based on the final pathology. For the lower extremity edema and hypertension, the patient was advised to follow-up with his primary care physician for management of this condition. He was advised to call immediately if he has any concerning symptoms.  All questions were answered. The patient knows to call the clinic with any problems, questions or concerns. We can certainly see the patient much sooner if necessary.  Disclaimer: This note was dictated with voice recognition software. Similar sounding words can inadvertently be transcribed and may be missed upon review.

## 2016-09-23 NOTE — Progress Notes (Signed)
Cardiology Office Note  Date: 09/26/2016   ID: [name], DOB 1927/11/08, MRN [id number]  PCP: Purvis Kilts, MD  Referring provider: Murray Hodgkins, MD Consulting Cardiologist: Rozann Lesches, MD   Chief Complaint  Patient presents with  . Atrial Fibrillation  . Coronary Artery Disease    History of Present Illness: [name] is an 82 y.o. male referred for cardiology consultation by Dr. Sarajane Jews following recent hospitalization. He has a fairly complicated medical history, I reviewed extensive records and updated the chart. He was recently admitted to [facility] with newly documented atrial fibrillation. He was managed on the hospitalist service, initiated on Eliquis for stroke prophylaxis with CHADSVASC score of 5, and rate control was obtained with metoprolol ultimately. He underwent additional chest imaging including CTA as outlined below which incidentally noted multivessel distribution CAD as well as stable appearing thoracic aortic aneurysm measuring 4.2 cm. Unfortunately, he also has evidence of recurrent lung cancer and was just recently seen by Dr. Julien Nordmann. He is to undergo further biopsy.  He comes in today denying any sense of palpitations or chest pain. He has not been aware of any heart racing or dizziness. He reports no bleeding problems so far on Eliquis. Heart rate came down into the 70s to 80s after a few minutes of being seated in the room.  Echocardiogram was obtained during recent hospital stay as detailed below, LVEF in the 45-50% range. I discussed this with him today. We also went over the incidental finding of coronary atherosclerosis by chest CT imaging. Medical therapy includes statin in addition to beta blocker and ARB.  Main complaint is that he has had leg swelling over the last 3 months at least. Fairly tight but not painful.  Past Medical History:  Diagnosis Date  . Colon polyps   . Enlarged prostate   . Essential hypertension   .  History of kidney stones   . Hypercholesteremia   . Lung cancer ([facility]) Dx'd 01/2007   Stage IA non-small cell lung cancer, moderately differentiated adenocarcinoma - VATS and seed implants  . Thoracic aortic aneurysm ([facility])    4.2 cm in AP diameter   . Type 2 diabetes mellitus ([place])     Past Surgical History:  Procedure Laterality Date  . APPENDECTOMY    . CERVICAL SPINE SURGERY    . CHOLECYSTECTOMY    . COLONOSCOPY  04/17/2012   Procedure: COLONOSCOPY;  Surgeon: Jamesetta So, MD;  Location: [facility];  Service: Gastroenterology;  Laterality: N/A;  . COLONOSCOPY W/ POLYPECTOMY    . TONSILLECTOMY    . wedge resection with seed implantation and node sampling with right VATS  03/13/2007    Current Outpatient Prescriptions  Medication Sig Dispense Refill  . alfuzosin (UROXATRAL) 10 MG 24 hr tablet Take 10 mg by mouth daily.      Marland Kitchen apixaban (ELIQUIS) 5 MG TABS tablet Take 1 tablet (5 mg total) by mouth 2 (two) times daily. 60 tablet 1  . folic acid (FOLVITE) 465 MCG tablet Take 800 mcg by mouth daily.    . furosemide (LASIX) 20 MG tablet Take 40 mg (2 pills) in the am for 2 weeks, then reduce to 20 mg daily 90 tablet 1  . glipiZIDE (GLUCOTROL) 5 MG tablet Take 5 mg by mouth 2 (two) times daily before a meal.    . JANUMET 50-1000 MG tablet 1 tablet 2 (two) times daily.    Marland Kitchen losartan (COZAAR) 100 MG tablet Take  100 mg by mouth daily.    . metoprolol (LOPRESSOR) 50 MG tablet Take 1 tablet (50 mg total) by mouth 2 (two) times daily. 60 tablet 0  . Omega-3 Fatty Acids (FISH OIL) 1000 MG CAPS Take by mouth.    . oxybutynin (DITROPAN-XL) 10 MG 24 hr tablet Take 10 mg by mouth at bedtime.    . potassium chloride (K-DUR) 10 MEQ tablet Take 1 tablet (10 mEq total) by mouth daily. 90 tablet 3  . simvastatin (ZOCOR) 40 MG tablet Take 40 mg by mouth at bedtime.       No current facility-administered medications for this visit.    Allergies:  Sulfa antibiotics   Social History: The patient   reports that he quit smoking about 51 years ago. His smoking use included Cigarettes. He has a 22.50 pack-year smoking history. He has never used smokeless tobacco. He reports that he does not drink alcohol or use drugs.   Family History: The patient's family history is not on file.   ROS:  Please see the history of present illness. Otherwise, complete review of systems is positive for decreased hearing, symptoms of depression after his wife passed away within the last year.  All other systems are reviewed and negative.   Physical Exam: VS:  BP 138/70   Pulse 97   Ht '5\' 10"'$  (1.778 m)   Wt 247 lb (112 kg)   SpO2 94%   BMI 35.44 kg/m , BMI Body mass index is 35.44 kg/m.  Wt Readings from Last 3 Encounters:  09/26/16 247 lb (112 kg)  09/23/16 247 lb 11.2 oz (112.4 kg)  09/14/16 250 lb 3.2 oz (113.5 kg)    General: Obese elderly male, appears comfortable at rest. HEENT: Conjunctiva and lids normal, oropharynx clear. Neck: Supple, no elevated JVP or carotid bruits, no thyromegaly. Lungs: Clear to auscultation, nonlabored breathing at rest. Cardiac: Irregularly irregular, no S3, soft systolic murmur, no pericardial rub. Abdomen: Protuberant, nontender, bowel sounds present, no guarding or rebound. Extremities: 2-3+ bilateral lower leg edema, distal pulses 2+. Skin: Warm and dry. Musculoskeletal: No kyphosis. Neuropsychiatric: Alert and oriented x3, affect grossly appropriate.  ECG: I personally reviewed the tracing from 08/17/2016 which showed atrial fibrillation with low voltage and nonspecific ST-T changes.  Recent Labwork: 08/25/2016: TSH 1.225 09/14/2016: ALT 9; AST 12; BUN 12.0; Creatinine 0.8; HGB 12.8; Platelets 205; Potassium 4.3; Sodium 131   Other Studies Reviewed Today:  Chest CTA 08/17/2016: IMPRESSION: No evidence of pulmonary embolism.  Postsurgical change with radiation seed implants over the subpleural location of the anterior right upper lobe compatible with  known history of lung cancer. Interval development of irregular soft tissue mass along the medial aspect of the anterior right upper lobe surgical site just medial to the radiation seeds measuring 3.8 x 8.1 cm likely representing local lung cancer recurrence. Small bilateral pleural effusions right greater than left with associated basilar atelectasis. Worsening mediastinal and hilar adenopathy likely metastatic nodal disease.  Subtle reticulonodular opacification over the right upper lobe unchanged to slightly more prominent. 1 cm pleural-based nodule opacity over the left lower lobe slightly more prominent. 1.5 cm focal pleural nodularity over the lateral right lower lobe which may be focal fluid versus metastatic pleural disease. Recommend attention on follow-up as this may represent metastatic disease.  Mild cardiomegaly with small pericardial effusion as the fusion is slightly worse. Three vessel atherosclerotic coronary artery disease.  Aortic atherosclerosis. Stable aneurysmal dilatation of the ascending thoracic aorta measuring 4.2 cm.  Echocardiogram 08/26/2016: Study Conclusions  - Left ventricle: The cavity size was normal. Wall thickness was   increased in a pattern of mild LVH. Systolic function was mildly   reduced. The estimated ejection fraction was in the range of 45%   to 50%. Although no diagnostic regional wall motion abnormality   was identified, this possibility cannot be completely excluded on   the basis of this study. The study is not technically sufficient   to allow evaluation of LV diastolic function. - Aortic valve: Moderately calcified annulus. Probably trileaflet;   mildly calcified leaflets. There was mild stenosis. There was   trivial regurgitation. Mean gradient (S): 8 mm Hg. Peak gradient   (S): 15 mm Hg. VTI ratio of LVOT to aortic valve: 0.46. Valve   area (VTI): 1.46 cm^2. Valve area (Vmax): 1.46 cm^2. Valve area   (Vmean): 1.64 cm^2. -  Aortic root: The aortic root was mildly dilated. - Mitral valve: Severely calcified annulus. There was trivial   regurgitation. - Left atrium: The atrium was mildly dilated. - Right atrium: The atrium was mildly dilated. Central venous   pressure (est): 8 mm Hg. - Tricuspid valve: There was mild regurgitation. - Pulmonary arteries: PA peak pressure: 39 mm Hg (S). - Pericardium, extracardiac: A small pericardial effusion was   identified posterior to the heart.  Impressions:  - Mild LVH with LVEF approximately 45-50% in the setting of atrial   fibrillation. Indeterminate diastolic function. Mild left atrial   enlargement. Severe MAC with trivial mitral regurgitation. Mild   calcific aortic stenosis with trivial aortic regurgitation.   Mildly dilated aortic root. Mild tricuspid regurgitation with   PASP 39 mmHg. Small posterior pericardial effusion.  Assessment and Plan:  1. Recently documented atrial fibrillation with CHADSVASC score of 5. He is asymptomatic in terms of palpitations and is tolerating Lopressor and Eliquis. Heart rate is adequately controlled today. We will continue with observation.  2. Bilateral leg swelling over the last 3 months, possibly related to atrial fibrillation and mild systolic dysfunction. Plan to start Lasix 40 mg daily with 10 mEq potassium daily at least for the next few weeks, recheck BMET with office visit. May be able to cut back diuretics at that point.  3. Incidentally noted multivessel distribution coronary atherosclerosis by recent chest CT imaging. He is not reporting any angina symptoms and we will plan conservative medical therapy at this time. He is on beta blocker, ARB, and statin. No aspirin with concurrent use of Eliquis.  4. Stable thoracic aortic aneurysm measuring 4.2 cm. Asymptomatic.  5. Recurrent lung cancer by recent chest CT imaging, follows with Dr. Julien Nordmann. He is to undergo further biopsy.  Current medicines were reviewed with  the patient today.   Orders Placed This Encounter  Procedures  . Basic Metabolic Panel (BMET)    Disposition: Follow-up in 2 weeks.  Signed, Satira Sark, MD, [facility] 09/26/2016 9:27 AM    [place] [facility] [place]. [address], [place], [place] [address] Phone: [phone]; Fax: [phone]

## 2016-09-26 ENCOUNTER — Encounter: Payer: Self-pay | Admitting: Cardiology

## 2016-09-26 ENCOUNTER — Ambulatory Visit (INDEPENDENT_AMBULATORY_CARE_PROVIDER_SITE_OTHER): Payer: Medicare Other | Admitting: Cardiology

## 2016-09-26 VITALS — BP 138/70 | HR 97 | Ht 70.0 in | Wt 247.0 lb

## 2016-09-26 DIAGNOSIS — I712 Thoracic aortic aneurysm, without rupture, unspecified: Secondary | ICD-10-CM

## 2016-09-26 DIAGNOSIS — C3490 Malignant neoplasm of unspecified part of unspecified bronchus or lung: Secondary | ICD-10-CM | POA: Diagnosis not present

## 2016-09-26 DIAGNOSIS — I481 Persistent atrial fibrillation: Secondary | ICD-10-CM

## 2016-09-26 DIAGNOSIS — I4819 Other persistent atrial fibrillation: Secondary | ICD-10-CM

## 2016-09-26 DIAGNOSIS — I25810 Atherosclerosis of coronary artery bypass graft(s) without angina pectoris: Secondary | ICD-10-CM

## 2016-09-26 MED ORDER — POTASSIUM CHLORIDE ER 10 MEQ PO TBCR: 10.0000 meq | EXTENDED_RELEASE_TABLET | Freq: Every day | ORAL | 3 refills | Status: DC

## 2016-09-26 MED ORDER — FUROSEMIDE 20 MG PO TABS: ORAL_TABLET | ORAL | 1 refills | Status: DC

## 2016-09-26 NOTE — Patient Instructions (Signed)
Your physician recommends that you schedule a follow-up appointment in: 2 weeks with provider     Start Lasix 40 mg daily for 2 weeks, then reduce to 20 mg daily    Take potassium 10 meq daily    Get lab work Artist) in 2 weeks-just before next visit       Thank you for choosing [facility] !

## 2016-09-27 DIAGNOSIS — E1151 Type 2 diabetes mellitus with diabetic peripheral angiopathy without gangrene: Secondary | ICD-10-CM | POA: Diagnosis not present

## 2016-09-27 DIAGNOSIS — E1140 Type 2 diabetes mellitus with diabetic neuropathy, unspecified: Secondary | ICD-10-CM | POA: Diagnosis not present

## 2016-09-30 ENCOUNTER — Emergency Department (HOSPITAL_COMMUNITY)
Admission: EM | Admit: 2016-09-30 | Discharge: 2016-09-30 | Disposition: A | Discharge status: Home / Self Care | Payer: Medicare Other | Attending: Emergency Medicine | Admitting: Emergency Medicine

## 2016-09-30 ENCOUNTER — Encounter (HOSPITAL_COMMUNITY): Payer: Self-pay | Admitting: Emergency Medicine

## 2016-09-30 ENCOUNTER — Emergency Department (HOSPITAL_COMMUNITY): Payer: Medicare Other

## 2016-09-30 DIAGNOSIS — R222 Localized swelling, mass and lump, trunk: Secondary | ICD-10-CM | POA: Insufficient documentation

## 2016-09-30 DIAGNOSIS — R0689 Other abnormalities of breathing: Secondary | ICD-10-CM | POA: Insufficient documentation

## 2016-09-30 DIAGNOSIS — R079 Chest pain, unspecified: Secondary | ICD-10-CM | POA: Diagnosis not present

## 2016-09-30 DIAGNOSIS — Z7984 Long term (current) use of oral hypoglycemic drugs: Secondary | ICD-10-CM | POA: Insufficient documentation

## 2016-09-30 DIAGNOSIS — J90 Pleural effusion, not elsewhere classified: Secondary | ICD-10-CM | POA: Diagnosis not present

## 2016-09-30 DIAGNOSIS — I10 Essential (primary) hypertension: Secondary | ICD-10-CM | POA: Diagnosis not present

## 2016-09-30 DIAGNOSIS — Z85118 Personal history of other malignant neoplasm of bronchus and lung: Secondary | ICD-10-CM | POA: Insufficient documentation

## 2016-09-30 DIAGNOSIS — R05 Cough: Secondary | ICD-10-CM | POA: Diagnosis present

## 2016-09-30 DIAGNOSIS — E119 Type 2 diabetes mellitus without complications: Secondary | ICD-10-CM | POA: Diagnosis not present

## 2016-09-30 DIAGNOSIS — Z79899 Other long term (current) drug therapy: Secondary | ICD-10-CM | POA: Insufficient documentation

## 2016-09-30 DIAGNOSIS — Z87891 Personal history of nicotine dependence: Secondary | ICD-10-CM | POA: Diagnosis not present

## 2016-09-30 DIAGNOSIS — J40 Bronchitis, not specified as acute or chronic: Secondary | ICD-10-CM | POA: Insufficient documentation

## 2016-09-30 LAB — COMPREHENSIVE METABOLIC PANEL
ALBUMIN: 4.2 g/dL (ref 3.5–5.0)
ALK PHOS: 46 U/L (ref 38–126)
ALT: 14 U/L — AB (ref 17–63)
AST: 17 U/L (ref 15–41)
Anion gap: 11 (ref 5–15)
BILIRUBIN TOTAL: 1 mg/dL (ref 0.3–1.2)
BUN: 26 mg/dL — AB (ref 6–20)
CALCIUM: 11 mg/dL — AB (ref 8.9–10.3)
CO2: 24 mmol/L (ref 22–32)
CREATININE: 1.01 mg/dL (ref 0.61–1.24)
Chloride: 96 mmol/L — ABNORMAL LOW (ref 101–111)
GFR calc Af Amer: >60 mL/min (ref >60)
GFR calc non Af Amer: >60 mL/min (ref >60)
GLUCOSE: 187 mg/dL — AB (ref 65–99)
Potassium: 4.2 mmol/L (ref 3.5–5.1)
Sodium: 131 mmol/L — ABNORMAL LOW (ref 135–145)
TOTAL PROTEIN: 7.3 g/dL (ref 6.5–8.1)

## 2016-09-30 LAB — CBC WITH DIFFERENTIAL/PLATELET
BASOS ABS: 0 10*3/uL (ref 0.0–0.1)
BASOS PCT: 0 %
Eosinophils Absolute: 0 10*3/uL (ref 0.0–0.7)
Eosinophils Relative: 0 %
HEMATOCRIT: 41.9 % (ref 39.0–52.0)
HEMOGLOBIN: 13.7 g/dL (ref 13.0–17.0)
LYMPHS PCT: 13 %
Lymphs Abs: 1.9 10*3/uL (ref 0.7–4.0)
MCH: 27.1 pg (ref 26.0–34.0)
MCHC: 32.7 g/dL (ref 30.0–36.0)
MCV: 83 fL (ref 78.0–100.0)
Monocytes Absolute: 1.7 10*3/uL — ABNORMAL HIGH (ref 0.1–1.0)
Monocytes Relative: 12 %
NEUTROS ABS: 11 10*3/uL — AB (ref 1.7–7.7)
NEUTROS PCT: 75 %
Platelets: 210 10*3/uL (ref 150–400)
RBC: 5.05 MIL/uL (ref 4.22–5.81)
RDW: 14.8 % (ref 11.5–15.5)
WBC: 14.6 10*3/uL — ABNORMAL HIGH (ref 4.0–10.5)

## 2016-09-30 LAB — PROTIME-INR
INR: 1.38
Prothrombin Time: 17.1 seconds — ABNORMAL HIGH (ref 11.4–15.2)

## 2016-09-30 LAB — BRAIN NATRIURETIC PEPTIDE: B NATRIURETIC PEPTIDE 5: 72 pg/mL (ref 0.0–100.0)

## 2016-09-30 LAB — TROPONIN I: Troponin I: <0.03 ng/mL (ref <0.03)

## 2016-09-30 MED ORDER — LEVOFLOXACIN 500 MG PO TABS: 500.0000 mg | ORAL_TABLET | Freq: Every day | ORAL | 0 refills | Status: DC

## 2016-09-30 MED ORDER — HYDROCODONE-ACETAMINOPHEN 5-325 MG PO TABS: 1.0000 | ORAL_TABLET | Freq: Four times a day (QID) | ORAL | 0 refills | Status: DC | PRN

## 2016-09-30 MED ORDER — IOPAMIDOL (ISOVUE-370) INJECTION 76%
50.0000 mL | Freq: Once | INTRAVENOUS | Status: AC | PRN
  Administered 2016-09-30: 50 mL via INTRAVENOUS

## 2016-09-30 MED ORDER — LEVOFLOXACIN 500 MG PO TABS
500.0000 mg | ORAL_TABLET | Freq: Once | ORAL | Status: AC
  Administered 2016-09-30: 500 mg via ORAL
  Filled 2016-09-30: qty 1

## 2016-09-30 NOTE — Discharge Instructions (Signed)
Return immediately for any worsening of her pain, fever, difficulty breathing or for any concerns. Follow-up with her oncologist as previously scheduled.

## 2016-09-30 NOTE — ED Triage Notes (Signed)
Per pt he was sent here from Dr Jake Shark office for CT scan, pt having right side chest discomfort, scheduled for lung biopsy next Friday.  Xray does not have an order.

## 2016-09-30 NOTE — ED Notes (Signed)
Dr. Burr Medico called back to speak with EDP at this time.

## 2016-09-30 NOTE — ED Provider Notes (Signed)
[facility] DEPT Provider Note   CSN: [id number] Arrival date & time: 09/30/16  1114  By signing my name below, I, [name], attest that this documentation has been prepared under the direction and in the presence of Andrew Rice, MD . Electronically Signed: Higinio French, Scribe. 09/30/2016. 11:34 AM.  History   Chief Complaint Chief Complaint  Patient presents with  . Chest Pain   The history is provided by the patient. No language interpreter was used.   HPI Comments: [name] is a 82 y.o. male with PMHx of who presents to the Emergency Department for an evaluation of gradual onset, right sided chest pain that began last night. Pt reports his pain is exacerbated when taking a deep breath. He notes associated productive cough with yellow sputum and chronic bilateral leg swelling. He states hx of lung cancer in April, 2008 and notes "it is coming back." He reports he has been scheduled for a biopsy on 10/07/16 at [facility] by his oncologist, Dr. Julien Nordmann. He denies shortness of breath, fever and chills. He reports he is currently taking coumadin.   Past Medical History:  Diagnosis Date  . Colon polyps   . Enlarged prostate   . Essential hypertension   . History of kidney stones   . Hypercholesteremia   . Lung cancer ([place]) Dx'd 01/2007   Stage IA non-small cell lung cancer, moderately differentiated adenocarcinoma - VATS and seed implants  . Thoracic aortic aneurysm ([facility])    4.2 cm in AP diameter   . Type 2 diabetes mellitus [facility])     Patient Active Problem List   Diagnosis Date Noted  . Atrial fibrillation with rapid ventricular response ([place]) 08/25/2016  . Lung mass 08/25/2016  . Chest pain 08/25/2016  . DM type 2 (diabetes mellitus, type 2) ([place]) 08/25/2016  . Aortic atherosclerosis ([place]) 08/25/2016  . Thoracic aortic aneurysm ([place]) 08/25/2016  . Hx of cancer of lung 11/07/2011  . Hypercholesteremia   . History of kidney stones     Past Surgical History:    Procedure Laterality Date  . APPENDECTOMY    . CERVICAL SPINE SURGERY    . CHOLECYSTECTOMY    . COLONOSCOPY  04/17/2012   Procedure: COLONOSCOPY;  Surgeon: Jamesetta So, MD;  Location: [facility] ENDO SUITE;  Service: Gastroenterology;  Laterality: N/A;  . COLONOSCOPY W/ POLYPECTOMY    . TONSILLECTOMY    . wedge resection with seed implantation and node sampling with right VATS  03/13/2007    Home Medications    Prior to Admission medications   Medication Sig Start Date End Date Taking? Authorizing Provider  alfuzosin (UROXATRAL) 10 MG 24 hr tablet Take 10 mg by mouth daily.     Yes Historical Provider, MD  apixaban (ELIQUIS) 5 MG TABS tablet Take 1 tablet (5 mg total) by mouth 2 (two) times daily. 08/26/16  Yes Andrew Cota, MD  folic acid (FOLVITE) 540 MCG tablet Take 800 mcg by mouth daily.   Yes Historical Provider, MD  furosemide (LASIX) 20 MG tablet Take 40 mg (2 pills) in the am for 2 weeks, then reduce to 20 mg daily 09/26/16  Yes Satira Sark, MD  glipiZIDE (GLUCOTROL) 5 MG tablet Take 5 mg by mouth 2 (two) times daily before a meal.   Yes Historical Provider, MD  JANUMET 50-1000 MG tablet 1 tablet 2 (two) times daily. 07/21/16  Yes Historical Provider, MD  losartan (COZAAR) 100 MG tablet Take 100 mg by mouth daily.  Yes Historical Provider, MD  metoprolol (LOPRESSOR) 50 MG tablet Take 1 tablet (50 mg total) by mouth 2 (two) times daily. 08/26/16  Yes Andrew Cota, MD  Omega-3 Fatty Acids (FISH OIL) 1000 MG CAPS Take by mouth.   Yes Historical Provider, MD  oxybutynin (DITROPAN-XL) 10 MG 24 hr tablet Take 10 mg by mouth at bedtime.   Yes Historical Provider, MD  potassium chloride (K-DUR) 10 MEQ tablet Take 1 tablet (10 mEq total) by mouth daily. 09/26/16 12/25/16 Yes Satira Sark, MD  simvastatin (ZOCOR) 40 MG tablet Take 40 mg by mouth at bedtime.     Yes Historical Provider, MD  HYDROcodone-acetaminophen (NORCO) 5-325 MG tablet Take 1-2 tablets by mouth every 6  (six) hours as needed for severe pain. 09/30/16   Andrew Rice, MD  levofloxacin (LEVAQUIN) 500 MG tablet Take 1 tablet (500 mg total) by mouth daily. 09/30/16   Andrew Rice, MD    Family History Family History  Problem Relation Age of Onset  . Colon cancer Neg Hx     Social History Social History  Substance Use Topics  . Smoking status: Former Smoker    Packs/day: 1.50    Years: 15.00    Types: Cigarettes    Quit date: 11/28/1964  . Smokeless tobacco: Never Used  . Alcohol use No     Allergies   Sulfa antibiotics   Review of Systems Review of Systems  Constitutional: Negative for chills and fever.  Respiratory: Positive for cough. Negative for chest tightness, shortness of breath and wheezing.   Cardiovascular: Positive for chest pain and leg swelling. Negative for palpitations.  Gastrointestinal: Negative for abdominal pain, diarrhea, nausea and vomiting.  Musculoskeletal: Negative for back pain, myalgias, neck pain and neck stiffness.  Skin: Negative for rash and wound.  Neurological: Negative for dizziness, weakness, light-headedness, numbness and headaches.  All other systems reviewed and are negative.  Physical Exam Updated Vital Signs BP 115/59 (BP Location: Right Arm)   Pulse 86   Temp 98.1 F (36.7 C) (Oral)   Resp 24   Ht '5\' 10"'$  (1.778 m)   Wt 233 lb (105.7 kg)   SpO2 94%   BMI 33.43 kg/m   Physical Exam  Constitutional: He is oriented to person, place, and time. He appears well-developed and well-nourished. No distress.  HENT:  Head: Normocephalic and atraumatic.  Mouth/Throat: Oropharynx is clear and moist. No oropharyngeal exudate.  Eyes: EOM are normal. Pupils are equal, round, and reactive to light.  Neck: Normal range of motion. Neck supple.  Cardiovascular: Normal rate.   Irregularly irregular  Pulmonary/Chest: Effort normal. He has no wheezes. He has rales. He exhibits no tenderness.  Shallow breaths. Patient has scattered rhonchi.  Breath sounds appear to be equal in both lungs.  Abdominal: Soft. Bowel sounds are normal. There is no tenderness. There is no rebound and no guarding.  Musculoskeletal: Normal range of motion. He exhibits edema. He exhibits no tenderness.  1+ bilateral pitting edema. Distal pulses are equal.  Neurological: He is alert and oriented to person, place, and time.  Moving all extremities without deficit. Sensation is fully intact.  Skin: Skin is warm and dry. Capillary refill takes less than 2 seconds. No rash noted. He is not diaphoretic. No erythema.  Psychiatric: He has a normal mood and affect. His behavior is normal.  Nursing note and vitals reviewed.   ED Treatments / Results  Labs (all labs ordered are listed, but only abnormal results are displayed)  Labs Reviewed  CBC WITH DIFFERENTIAL/PLATELET - Abnormal; Notable for the following:       Result Value   WBC 14.6 (*)    Neutro Abs 11.0 (*)    Monocytes Absolute 1.7 (*)    All other components within normal limits  COMPREHENSIVE METABOLIC PANEL - Abnormal; Notable for the following:    Sodium 131 (*)    Chloride 96 (*)    Glucose, Bld 187 (*)    BUN 26 (*)    Calcium 11.0 (*)    ALT 14 (*)    All other components within normal limits  PROTIME-INR - Abnormal; Notable for the following:    Prothrombin Time 17.1 (*)    All other components within normal limits  BRAIN NATRIURETIC PEPTIDE  TROPONIN I    EKG  EKG Interpretation  Date/Time:  Friday September 30 2016 11:24:44 EDT Ventricular Rate:  101 PR Interval:    QRS Duration: 89 QT Interval:  364 QTC Calculation: 479 R Axis:   15 Text Interpretation:  Atrial fibrillation Low voltage, precordial leads Abnormal R-wave progression, early transition Borderline prolonged QT interval Baseline wander in lead(s) I III aVL V5 Confirmed by Lita Mains  MD, Alka Falwell ([id number]) on 09/30/2016 11:27:04 AM       Radiology Dg Chest 2 View  Result Date: 09/30/2016 CLINICAL DATA:   Right-sided chest pain. EXAM: CHEST  2 VIEW COMPARISON:  PET-CT 09/21/2016 FINDINGS: Anterior right upper lobe mass with multiple radiation seeds along the periphery. Small right pleural effusion. No left pleural effusion. No other focal parenchymal opacity. No pneumothorax. Stable cardiomediastinal silhouette. No acute osseous abnormality. IMPRESSION: 1. Stable anterior right upper lobe mass. Electronically Signed   By: Kathreen Devoid   On: 09/30/2016 12:42   Ct Angio Chest Pe W And/or Wo Contrast  Result Date: 09/30/2016 CLINICAL DATA:  RIGHT chest pain and productive cough for 1 day. History of lung cancer 2008 VATS and brachytherapy, thoracic aortic aneurysm, hypertension and diabetes. EXAM: CT ANGIOGRAPHY CHEST WITH CONTRAST TECHNIQUE: Multidetector CT imaging of the chest was performed using the standard protocol during bolus administration of intravenous contrast. Multiplanar CT image reconstructions and MIPs were obtained to evaluate the vascular anatomy. CONTRAST:  50 cc Isovue 370 COMPARISON:  Chest radiograph September 30, 2016 at 1211 hours and CT chest August 25, 2016 FINDINGS: CARDIOVASCULAR: Adequate contrast opacification of the pulmonary artery's. Main pulmonary artery is not enlarged. No pulmonary arterial filling defects to the level of the subsegmental branches. Heart size is mildly enlarged, no right heart strain. Severe coronary artery calcifications. Enlarging pericardial effusion was 11 mm, now 22 mm. Thoracic aorta is 4.2 cm, stable. MEDIASTINUM/NODES: New 17 mm short access anterior mediastinal lymph node medial to the upper lobe mass. New 14 mm cardiophrenic angle lymph node. Additional greater than expected number of mediastinal lymph nodes measuring up to 12 mm. LUNGS/PLEURA: RIGHT upper lobe pleural based 8.9 x 4.2 cm mass was 8.2 x 3.8 cm. Brachytherapy seeds along the anterior superior aspect of the mass, status post RIGHT upper lobe wedge resection. Similar RIGHT upper lobe  centrilobular nodules deep to the mass. Increasing small to moderate RIGHT, stable small LEFT pleural effusion. Mild centrilobular emphysema. Stable 5 mm RIGHT upper lobe ground-glass nodule. Diffuse bronchial wall thickening, tracheal bronchial stable 10 mm nodule abutting the pleural surface LEFT lower lobe. No pneumothorax. UPPER ABDOMEN: Included view of the abdomen is nonacute. RIGHT upper pole renal cyst incompletely characterized. Abdominal suture material. No adrenal mass though incompletely imaged.  MUSCULOSKELETAL: Visualized soft tissues and included osseous structures nonacute. Gynecomastia. Multilevel Schmorl's nodes. Review of the MIP images confirms the above findings. IMPRESSION: No acute pulmonary embolism. Enlarging moderate pericardial effusion. Enlarging 8.9 x 4.2 cm RIGHT upper lobe mass, with new pathologic lymphadenopathy consistent with disease progression and new metastasis. Similar RIGHT upper lobe tree-in-bud infiltrates concerning for lymphangitic spread of tumor. Increasing small to moderate RIGHT and similar small LEFT pleural effusion concerning for malignant effusion. Stable 4.2 cm ascending aortic aneurysm. Recommend annual imaging followup by CTA or MRA. This recommendation follows 2010 ACCF/AHA/AATS/ACR/ASA/SCA/SCAI/SIR/STS/SVM Guidelines for the Diagnosis and Management of Patients with Thoracic Aortic Disease. Circulation. 2010; 121: F842-J031 Ascending thoracic aortic aneurysm. Electronically Signed   By: Elon Alas M.D.   On: 09/30/2016 14:48    Procedures Procedures (including critical care time)  Medications Ordered in ED Medications  levofloxacin (LEVAQUIN) tablet 500 mg (not administered)  iopamidol (ISOVUE-370) 76 % injection 50 mL (50 mLs Intravenous Contrast Given 09/30/16 1418)    DIAGNOSTIC STUDIES:  Oxygen Saturation is 96% on room air, normal, by my interpretation.    COORDINATION OF CARE:  11:33 AM Discussed treatment French with pt at bedside  and pt agreed to French.  Initial Impression / Assessment and French / ED Course  I have reviewed the triage vital signs and the nursing notes.  Pertinent labs & imaging results that were available during my care of the patient were reviewed by me and considered in my medical decision making (see chart for details).  Clinical Course   Patient states his chest pain has improved. His vital signs remain stable on room air. CT chest with progression of the chest mass and likely lymphangitic spread. Patient does have lateral pleural effusions and a pericardial effusion. Given elevation in white blood cell count and productive cough starting on antibiotics though no pneumonia visualized. Patient does not want to stay in the hospital. Discussed with Dr. Burr Medico. Agreed with outpatient follow-up. Patient understands the need to return immediately for any worsening pain, fever, shortness of breath or for any concerns. At that point he will likely need to be admitted.  I personally performed the services described in this documentation, which was scribed in my presence. The recorded information has been reviewed and is accurate.   Final Clinical Impressions(s) / ED Diagnoses   Final diagnoses:  Chest mass  Pleural effusion, bilateral  Bronchitis    New Prescriptions New Prescriptions   HYDROCODONE-ACETAMINOPHEN (NORCO) 5-325 MG TABLET    Take 1-2 tablets by mouth every 6 (six) hours as needed for severe pain.   LEVOFLOXACIN (LEVAQUIN) 500 MG TABLET    Take 1 tablet (500 mg total) by mouth daily.     Andrew Rice, MD 09/30/16 1536

## 2016-10-06 ENCOUNTER — Other Ambulatory Visit (HOSPITAL_BASED_OUTPATIENT_CLINIC_OR_DEPARTMENT_OTHER): Payer: Medicare Other

## 2016-10-06 ENCOUNTER — Other Ambulatory Visit: Payer: Self-pay | Admitting: Radiology

## 2016-10-06 DIAGNOSIS — Z85118 Personal history of other malignant neoplasm of bronchus and lung: Secondary | ICD-10-CM

## 2016-10-06 DIAGNOSIS — R918 Other nonspecific abnormal finding of lung field: Secondary | ICD-10-CM

## 2016-10-06 LAB — CBC WITH DIFFERENTIAL/PLATELET
BASO%: 0.1 % (ref 0.0–2.0)
BASOS ABS: 0 10*3/uL (ref 0.0–0.1)
EOS%: 1.2 % (ref 0.0–7.0)
Eosinophils Absolute: 0.1 10*3/uL (ref 0.0–0.5)
HCT: 38.1 % — ABNORMAL LOW (ref 38.4–49.9)
HEMOGLOBIN: 12.3 g/dL — AB (ref 13.0–17.1)
LYMPH#: 1.7 10*3/uL (ref 0.9–3.3)
LYMPH%: 20 % (ref 14.0–49.0)
MCH: 26.2 pg — ABNORMAL LOW (ref 27.2–33.4)
MCHC: 32.3 g/dL (ref 32.0–36.0)
MCV: 81.1 fL (ref 79.3–98.0)
MONO#: 0.6 10*3/uL (ref 0.1–0.9)
MONO%: 7.6 % (ref 0.0–14.0)
NEUT#: 5.9 10*3/uL (ref 1.5–6.5)
NEUT%: 71.1 % (ref 39.0–75.0)
PLATELETS: 231 10*3/uL (ref 140–400)
RBC: 4.7 10*6/uL (ref 4.20–5.82)
RDW: 14.5 % (ref 11.0–14.6)
WBC: 8.3 10*3/uL (ref 4.0–10.3)

## 2016-10-06 LAB — COMPREHENSIVE METABOLIC PANEL
ALBUMIN: 3.1 g/dL — AB (ref 3.5–5.0)
ALK PHOS: 55 U/L (ref 40–150)
ALT: 17 U/L (ref 0–55)
ANION GAP: 9 meq/L (ref 3–11)
AST: 14 U/L (ref 5–34)
BUN: 23.9 mg/dL (ref 7.0–26.0)
CO2: 26 meq/L (ref 22–29)
CREATININE: 0.8 mg/dL (ref 0.7–1.3)
Calcium: 10.5 mg/dL — ABNORMAL HIGH (ref 8.4–10.4)
Chloride: 103 mEq/L (ref 98–109)
EGFR: 81 mL/min/{1.73_m2} — AB (ref >90)
GLUCOSE: 124 mg/dL (ref 70–140)
Potassium: 4.3 mEq/L (ref 3.5–5.1)
SODIUM: 138 meq/L (ref 136–145)
Total Bilirubin: 0.51 mg/dL (ref 0.20–1.20)
Total Protein: 6.5 g/dL (ref 6.4–8.3)

## 2016-10-07 ENCOUNTER — Encounter (HOSPITAL_COMMUNITY): Payer: Self-pay

## 2016-10-07 ENCOUNTER — Ambulatory Visit (HOSPITAL_COMMUNITY)
Admission: RE | Admit: 2016-10-07 | Discharge: 2016-10-07 | Disposition: A | Discharge status: Home / Self Care | Payer: Medicare Other | Source: Ambulatory Visit | Attending: Internal Medicine | Admitting: Internal Medicine

## 2016-10-07 DIAGNOSIS — I313 Pericardial effusion (noninflammatory): Secondary | ICD-10-CM | POA: Insufficient documentation

## 2016-10-07 DIAGNOSIS — Z86010 Personal history of colonic polyps: Secondary | ICD-10-CM | POA: Insufficient documentation

## 2016-10-07 DIAGNOSIS — Z79899 Other long term (current) drug therapy: Secondary | ICD-10-CM | POA: Diagnosis not present

## 2016-10-07 DIAGNOSIS — Z800 Family history of malignant neoplasm of digestive organs: Secondary | ICD-10-CM | POA: Insufficient documentation

## 2016-10-07 DIAGNOSIS — E7800 Pure hypercholesterolemia, unspecified: Secondary | ICD-10-CM | POA: Insufficient documentation

## 2016-10-07 DIAGNOSIS — Z85118 Personal history of other malignant neoplasm of bronchus and lung: Secondary | ICD-10-CM | POA: Insufficient documentation

## 2016-10-07 DIAGNOSIS — C3411 Malignant neoplasm of upper lobe, right bronchus or lung: Secondary | ICD-10-CM | POA: Insufficient documentation

## 2016-10-07 DIAGNOSIS — J90 Pleural effusion, not elsewhere classified: Secondary | ICD-10-CM | POA: Diagnosis not present

## 2016-10-07 DIAGNOSIS — Z87891 Personal history of nicotine dependence: Secondary | ICD-10-CM | POA: Insufficient documentation

## 2016-10-07 DIAGNOSIS — N400 Enlarged prostate without lower urinary tract symptoms: Secondary | ICD-10-CM | POA: Insufficient documentation

## 2016-10-07 DIAGNOSIS — N135 Crossing vessel and stricture of ureter without hydronephrosis: Secondary | ICD-10-CM | POA: Diagnosis not present

## 2016-10-07 DIAGNOSIS — Z87442 Personal history of urinary calculi: Secondary | ICD-10-CM | POA: Insufficient documentation

## 2016-10-07 DIAGNOSIS — Z9049 Acquired absence of other specified parts of digestive tract: Secondary | ICD-10-CM | POA: Diagnosis not present

## 2016-10-07 DIAGNOSIS — E119 Type 2 diabetes mellitus without complications: Secondary | ICD-10-CM | POA: Insufficient documentation

## 2016-10-07 DIAGNOSIS — Z888 Allergy status to other drugs, medicaments and biological substances status: Secondary | ICD-10-CM | POA: Insufficient documentation

## 2016-10-07 DIAGNOSIS — Z7901 Long term (current) use of anticoagulants: Secondary | ICD-10-CM | POA: Diagnosis not present

## 2016-10-07 DIAGNOSIS — Z9889 Other specified postprocedural states: Secondary | ICD-10-CM | POA: Insufficient documentation

## 2016-10-07 DIAGNOSIS — R918 Other nonspecific abnormal finding of lung field: Secondary | ICD-10-CM | POA: Insufficient documentation

## 2016-10-07 LAB — CBC
HCT: 40.5 % (ref 39.0–52.0)
Hemoglobin: 13 g/dL (ref 13.0–17.0)
MCH: 26.2 pg (ref 26.0–34.0)
MCHC: 32.1 g/dL (ref 30.0–36.0)
MCV: 81.5 fL (ref 78.0–100.0)
PLATELETS: 318 10*3/uL (ref 150–400)
RBC: 4.97 MIL/uL (ref 4.22–5.81)
RDW: 15 % (ref 11.5–15.5)
WBC: 10 10*3/uL (ref 4.0–10.5)

## 2016-10-07 LAB — PROTIME-INR
INR: 1.08
Prothrombin Time: 14 seconds (ref 11.4–15.2)

## 2016-10-07 LAB — APTT: APTT: 32 s (ref 24–36)

## 2016-10-07 LAB — GLUCOSE, CAPILLARY
GLUCOSE-CAPILLARY: 126 mg/dL — AB (ref 65–99)
Glucose-Capillary: 113 mg/dL — ABNORMAL HIGH (ref 65–99)

## 2016-10-07 MED ORDER — FENTANYL CITRATE (PF) 100 MCG/2ML IJ SOLN
INTRAMUSCULAR | Status: AC | PRN
  Administered 2016-10-07: 50 ug via INTRAVENOUS

## 2016-10-07 MED ORDER — LIDOCAINE HCL 1 % IJ SOLN
INTRAMUSCULAR | Status: AC
  Filled 2016-10-07: qty 20

## 2016-10-07 MED ORDER — SODIUM CHLORIDE 0.9 % IV SOLN: INTRAVENOUS | Status: DC

## 2016-10-07 MED ORDER — MIDAZOLAM HCL 2 MG/2ML IJ SOLN
INTRAMUSCULAR | Status: AC | PRN
  Administered 2016-10-07: 0.5 mg via INTRAVENOUS

## 2016-10-07 MED ORDER — FENTANYL CITRATE (PF) 100 MCG/2ML IJ SOLN
INTRAMUSCULAR | Status: AC
  Filled 2016-10-07: qty 2

## 2016-10-07 MED ORDER — MIDAZOLAM HCL 2 MG/2ML IJ SOLN
INTRAMUSCULAR | Status: AC
  Filled 2016-10-07: qty 2

## 2016-10-07 NOTE — Discharge Instructions (Signed)

## 2016-10-07 NOTE — Procedures (Signed)
S/P ANTERIOR RUL MASS BX  NO COMP STABLE PATH PENDING FULL REPORT IN PACS

## 2016-10-07 NOTE — H&P (Signed)
Chief Complaint: Patient was seen in consultation today for right lung mass biopsy at the request of [facility]  Referring Physician(s): Mohamed,Mohamed  Supervising Physician: Daryll Brod  Patient Status: [facility] - Out-pt  History of Present Illness: [name] is a 82 y.o. male   Hx Lung Ca 2008 Wedge resection RUL and seed implants  Followed since then with Dr Julien Nordmann Developed cough and cold; Dx with URI 2 weeks ago Imaging revealed new RUL mass +PET 09/21/16: IMPRESSION: 1. Pleural-based right upper lobe lung hypermetabolic mass which is favored to represent a metachronous primary bronchogenic carcinoma. 2. Right pleural and thoracic nodal metastasis. 3. No hypermetabolic extrathoracic metastasis identified. 4. Incidental findings, including urinary tract calculi, chronic right ureteropelvic junction obstruction, and atherosclerosis. 5. Enlargement of a moderate pericardial and small right pleural effusion. 6. Pulmonary artery enlargement suggests pulmonary arterial Hypertension.  Request for biopsy per Dr Julien Nordmann   Past Medical History:  Diagnosis Date  . Colon polyps   . Enlarged prostate   . Essential hypertension   . History of kidney stones   . Hypercholesteremia   . Lung cancer ([place]) Dx'd 01/2007   Stage IA non-small cell lung cancer, moderately differentiated adenocarcinoma - VATS and seed implants  . Thoracic aortic aneurysm ([facility])    4.2 cm in AP diameter   . Type 2 diabetes mellitus ([place])     Past Surgical History:  Procedure Laterality Date  . APPENDECTOMY    . CERVICAL SPINE SURGERY    . CHOLECYSTECTOMY    . COLONOSCOPY  04/17/2012   Procedure: COLONOSCOPY;  Surgeon: Jamesetta So, MD;  Location: [facility] ENDO SUITE;  Service: Gastroenterology;  Laterality: N/A;  . COLONOSCOPY W/ POLYPECTOMY    . TONSILLECTOMY    . wedge resection with seed implantation and node sampling with right VATS  03/13/2007    Allergies: Sulfa  antibiotics  Medications: Prior to Admission medications   Medication Sig Start Date End Date Taking? Authorizing Provider  alfuzosin (UROXATRAL) 10 MG 24 hr tablet Take 10 mg by mouth daily.     Yes Historical Provider, MD  folic acid (FOLVITE) 536 MCG tablet Take 800 mcg by mouth daily.   Yes Historical Provider, MD  furosemide (LASIX) 20 MG tablet Take 40 mg (2 pills) in the am for 2 weeks, then reduce to 20 mg daily 09/26/16  Yes Satira Sark, MD  glipiZIDE (GLUCOTROL) 5 MG tablet Take 5 mg by mouth 2 (two) times daily before a meal.   Yes Historical Provider, MD  JANUMET 50-1000 MG tablet 1 tablet 2 (two) times daily. 07/21/16  Yes Historical Provider, MD  levofloxacin (LEVAQUIN) 500 MG tablet Take 1 tablet (500 mg total) by mouth daily. 09/30/16  Yes [name], MD  losartan (COZAAR) 100 MG tablet Take 100 mg by mouth daily.   Yes Historical Provider, MD  metoprolol (LOPRESSOR) 50 MG tablet Take 1 tablet (50 mg total) by mouth 2 (two) times daily. 08/26/16  Yes Samuella Cota, MD  Omega-3 Fatty Acids (FISH OIL) 1000 MG CAPS Take by mouth.   Yes Historical Provider, MD  oxybutynin (DITROPAN-XL) 10 MG 24 hr tablet Take 10 mg by mouth at bedtime.   Yes Historical Provider, MD  potassium chloride (K-DUR) 10 MEQ tablet Take 1 tablet (10 mEq total) by mouth daily. 09/26/16 12/25/16 Yes Satira Sark, MD  simvastatin (ZOCOR) 40 MG tablet Take 40 mg by mouth at bedtime.     Yes Historical Provider, MD  apixaban (ELIQUIS) 5 MG TABS tablet Take 1 tablet (5 mg total) by mouth 2 (two) times daily. 08/26/16   Samuella Cota, MD  HYDROcodone-acetaminophen (NORCO) 5-325 MG tablet Take 1-2 tablets by mouth every 6 (six) hours as needed for severe pain. 09/30/16   [name], MD     Family History  Problem Relation Age of Onset  . Colon cancer Neg Hx     Social History   Social History  . Marital status: Married    Spouse name: N/A  . Number of children: N/A  . Years of  education: N/A   Social History Main Topics  . Smoking status: Former Smoker    Packs/day: 1.50    Years: 15.00    Types: Cigarettes    Quit date: 11/28/1964  . Smokeless tobacco: Never Used  . Alcohol use No  . Drug use: No  . Sexual activity: Not Asked   Other Topics Concern  . None   Social History Narrative  . None     Review of Systems: A 12 point ROS discussed and pertinent positives are indicated in the HPI above.  All other systems are negative.  Review of Systems  Constitutional: Positive for fatigue. Negative for activity change and fever.  HENT: Positive for hearing loss.   Respiratory: Positive for cough and shortness of breath.   Cardiovascular: Negative for chest pain.  Gastrointestinal: Negative for abdominal pain.  Psychiatric/Behavioral: Negative for behavioral problems and confusion.    Vital Signs: BP (!) 170/71   Pulse 87   Temp 97.5 F (36.4 C) (Oral)   Ht (P) '5\' 10"'$  (1.778 m)   Wt (P) 232 lb (105.2 kg)   SpO2 97%   BMI (P) 33.29 kg/m   Physical Exam  Constitutional: He is oriented to person, place, and time. He appears well-nourished.  Cardiovascular:  No murmur heard. Irregular rate  Pulmonary/Chest: Effort normal and breath sounds normal. He has no wheezes.  Abdominal: Soft. Bowel sounds are normal. There is no tenderness.  Musculoskeletal: Normal range of motion.  Neurological: He is alert and oriented to person, place, and time.  Skin: Skin is warm and dry.  Psychiatric: He has a normal mood and affect. His behavior is normal. Judgment and thought content normal.  Nursing note and vitals reviewed.   Mallampati Score:  MD Evaluation Airway: WNL Heart: WNL Abdomen: WNL Chest/ Lungs: WNL ASA  Classification: 3 Mallampati/Airway Score: One  Imaging: Dg Chest 2 View  Result Date: 09/30/2016 CLINICAL DATA:  Right-sided chest pain. EXAM: CHEST  2 VIEW COMPARISON:  PET-CT 09/21/2016 FINDINGS: Anterior right upper lobe mass with  multiple radiation seeds along the periphery. Small right pleural effusion. No left pleural effusion. No other focal parenchymal opacity. No pneumothorax. Stable cardiomediastinal silhouette. No acute osseous abnormality. IMPRESSION: 1. Stable anterior right upper lobe mass. Electronically Signed   By: Kathreen Devoid   On: 09/30/2016 12:42   Ct Angio Chest Pe W And/or Wo Contrast  Result Date: 09/30/2016 CLINICAL DATA:  RIGHT chest pain and productive cough for 1 day. History of lung cancer 2008 VATS and brachytherapy, thoracic aortic aneurysm, hypertension and diabetes. EXAM: CT ANGIOGRAPHY CHEST WITH CONTRAST TECHNIQUE: Multidetector CT imaging of the chest was performed using the standard protocol during bolus administration of intravenous contrast. Multiplanar CT image reconstructions and MIPs were obtained to evaluate the vascular anatomy. CONTRAST:  50 cc Isovue 370 COMPARISON:  Chest radiograph September 30, 2016 at 1211 hours and CT chest August 25, 2016 FINDINGS: CARDIOVASCULAR: Adequate contrast opacification of the pulmonary artery's. Main pulmonary artery is not enlarged. No pulmonary arterial filling defects to the level of the subsegmental branches. Heart size is mildly enlarged, no right heart strain. Severe coronary artery calcifications. Enlarging pericardial effusion was 11 mm, now 22 mm. Thoracic aorta is 4.2 cm, stable. MEDIASTINUM/NODES: New 17 mm short access anterior mediastinal lymph node medial to the upper lobe mass. New 14 mm cardiophrenic angle lymph node. Additional greater than expected number of mediastinal lymph nodes measuring up to 12 mm. LUNGS/PLEURA: RIGHT upper lobe pleural based 8.9 x 4.2 cm mass was 8.2 x 3.8 cm. Brachytherapy seeds along the anterior superior aspect of the mass, status post RIGHT upper lobe wedge resection. Similar RIGHT upper lobe centrilobular nodules deep to the mass. Increasing small to moderate RIGHT, stable small LEFT pleural effusion. Mild  centrilobular emphysema. Stable 5 mm RIGHT upper lobe ground-glass nodule. Diffuse bronchial wall thickening, tracheal bronchial stable 10 mm nodule abutting the pleural surface LEFT lower lobe. No pneumothorax. UPPER ABDOMEN: Included view of the abdomen is nonacute. RIGHT upper pole renal cyst incompletely characterized. Abdominal suture material. No adrenal mass though incompletely imaged. MUSCULOSKELETAL: Visualized soft tissues and included osseous structures nonacute. Gynecomastia. Multilevel Schmorl's nodes. Review of the MIP images confirms the above findings. IMPRESSION: No acute pulmonary embolism. Enlarging moderate pericardial effusion. Enlarging 8.9 x 4.2 cm RIGHT upper lobe mass, with new pathologic lymphadenopathy consistent with disease progression and new metastasis. Similar RIGHT upper lobe tree-in-bud infiltrates concerning for lymphangitic spread of tumor. Increasing small to moderate RIGHT and similar small LEFT pleural effusion concerning for malignant effusion. Stable 4.2 cm ascending aortic aneurysm. Recommend annual imaging followup by CTA or MRA. This recommendation follows 2010 ACCF/AHA/AATS/ACR/ASA/SCA/SCAI/SIR/STS/SVM Guidelines for the Diagnosis and Management of Patients with Thoracic Aortic Disease. Circulation. 2010; 121: P536-R443 Ascending thoracic aortic aneurysm. Electronically Signed   By: Elon Alas M.D.   On: 09/30/2016 14:48   Nm Pet Image Restag (ps) Skull Base To Thigh  Result Date: 09/21/2016 CLINICAL DATA:  Subsequent treatment strategy for restaging of lung cancer. Status post radiation seed implants. EXAM: NUCLEAR MEDICINE PET SKULL BASE TO THIGH TECHNIQUE: 11.9 mCi F-18 FDG was injected intravenously. Full-ring PET imaging was performed from the skull base to thigh after the radiotracer. CT data was obtained and used for attenuation correction and anatomic localization. FASTING BLOOD GLUCOSE:  Value: 122 mg/dl COMPARISON:  08/25/2016 chest CT.  PET of  11/16/2006. FINDINGS: NECK No areas of abnormal hypermetabolism. CHEST Hypermetabolism corresponding to the pleural-based anterior right upper lobe along the mass. This measures on the order of 8.3 x 4.2 cm and a S.U.V. max of 17.9 on image 72/ series 4. Multiple right-sided hypermetabolic pleural implants. Example measuring 1.3 cm and a S.U.V. max of 11.1 on image 89/series 4. Low right mediastinal and infrahilar hypermetabolic nodes, including at a S.U.V. max of 5.7 on image 82/ series 4. ABDOMEN/PELVIS No adrenal hypermetabolism. Colonic hypermetabolism is diffuse and likely physiologic. SKELETON No abnormal marrow activity. CT IMAGES PERFORMED FOR ATTENUATION CORRECTION No cervical adenopathy. Carotid atherosclerosis bilaterally. Chest findings deferred to recent diagnostic CT. Right-sided pleural effusion is minimally increased. Radiation seeds in the anterior right upper lobe. Cardiomegaly. Increase in moderate pericardial effusion. Multivessel coronary artery atherosclerosis. Pulmonary artery enlargement, 4.2 cm outflow tract. Bilateral large volume renal collecting system calculi. Bilateral low-density renal lesions are likely cysts. Advanced abdominal aortic and branch vessel atherosclerosis. Moderate prostatomegaly. Right-sided bladder calculi. Minimal chronic right-sided caliectasis is similar  and likely relates to a mild ureteropelvic junction obstruction. Large colonic stool burden, especially distally. IMPRESSION: 1. Pleural-based right upper lobe lung hypermetabolic mass which is favored to represent a metachronous primary bronchogenic carcinoma. 2. Right pleural and thoracic nodal metastasis. 3. No hypermetabolic extrathoracic metastasis identified. 4. Incidental findings, including urinary tract calculi, chronic right ureteropelvic junction obstruction, and atherosclerosis. 5. Enlargement of a moderate pericardial and small right pleural effusion. 6. Pulmonary artery enlargement suggests pulmonary  arterial hypertension. Electronically Signed   By: Abigail Miyamoto M.D.   On: 09/21/2016 09:28    Labs:  CBC:  Recent Labs  08/25/16 0948 09/14/16 0738 09/30/16 1139 10/06/16 0743  WBC 12.4* 8.6 14.6* 8.3  HGB 13.0 12.8* 13.7 12.3*  HCT 40.0 38.9 41.9 38.1*  PLT 212 205 210 231    COAGS:  Recent Labs  09/30/16 1139 10/07/16 0953  INR 1.38 1.08  APTT  --  32    BMP:  Recent Labs  08/25/16 0948 09/14/16 0738 09/30/16 1139 10/06/16 0743  NA 135 131* 131* 138  K 4.0 4.3 4.2 4.3  CL 100*  --  96*  --   CO2 '26 24 24 26  '$ GLUCOSE 171* 90 187* 124  BUN 19 12.0 26* 23.9  CALCIUM 10.3 9.9 11.0* 10.5*  CREATININE 0.80 0.8 1.01 0.8  GFRNONAA >60  --  >60  --   GFRAA >60  --  >60  --     LIVER FUNCTION TESTS:  Recent Labs  09/14/16 0738 09/30/16 1139 10/06/16 0743  BILITOT 0.64 1.0 0.51  AST '12 17 14  '$ ALT 9 14* 17  ALKPHOS 49 46 55  PROT 6.6 7.3 6.5  ALBUMIN 3.6 4.2 3.1*    TUMOR MARKERS: No results for input(s): AFPTM, CEA, CA199, CHROMGRNA in the last 8760 hours.  Assessment and Plan:  Hx lung ca 2008 New RUL lesion; + PET Scheduled for biopsy of same Risks and Benefits discussed with the patient including, but not limited to bleeding, hemoptysis, respiratory failure requiring intubation, infection, pneumothorax requiring chest tube placement, stroke from air embolism or even death. All of the patient's questions were answered, patient is agreeable to proceed. Consent signed and in chart.   Thank you for this interesting consult.  I greatly enjoyed meeting SHANNA STRENGTH and look forward to participating in their care.  A copy of this report was sent to the requesting provider on this date.  Electronically Signed: Mallori Araque A 10/07/2016, 10:45 AM   I spent a total of  30 Minutes   in face to face in clinical consultation, greater than 50% of which was counseling/coordinating care for right lung mass biopsy

## 2016-10-07 NOTE — Sedation Documentation (Signed)
Waiting on lab work, Dr Annamaria Boots in to see pt, family at bedside.

## 2016-10-07 NOTE — Sedation Documentation (Signed)
Patient denies pain and is resting comfortably.  

## 2016-10-10 DIAGNOSIS — I481 Persistent atrial fibrillation: Secondary | ICD-10-CM | POA: Diagnosis not present

## 2016-10-11 ENCOUNTER — Ambulatory Visit (INDEPENDENT_AMBULATORY_CARE_PROVIDER_SITE_OTHER): Payer: Medicare Other | Admitting: Adult Health

## 2016-10-11 ENCOUNTER — Encounter: Payer: Self-pay | Admitting: Adult Health

## 2016-10-11 VITALS — BP 122/64 | HR 98 | Ht 67.0 in | Wt 237.0 lb

## 2016-10-11 DIAGNOSIS — R600 Localized edema: Secondary | ICD-10-CM | POA: Diagnosis not present

## 2016-10-11 DIAGNOSIS — I25810 Atherosclerosis of coronary artery bypass graft(s) without angina pectoris: Secondary | ICD-10-CM

## 2016-10-11 DIAGNOSIS — Z79899 Other long term (current) drug therapy: Secondary | ICD-10-CM | POA: Diagnosis not present

## 2016-10-11 DIAGNOSIS — I4891 Unspecified atrial fibrillation: Secondary | ICD-10-CM

## 2016-10-11 LAB — BASIC METABOLIC PANEL
BUN: 25 mg/dL (ref 7–25)
CALCIUM: 10.2 mg/dL (ref 8.6–10.3)
CO2: 27 mmol/L (ref 20–31)
CREATININE: 1.17 mg/dL — AB (ref 0.70–1.11)
Chloride: 99 mmol/L (ref 98–110)
GLUCOSE: 121 mg/dL — AB (ref 65–99)
POTASSIUM: 4.4 mmol/L (ref 3.5–5.3)
Sodium: 137 mmol/L (ref 135–146)

## 2016-10-11 NOTE — Patient Instructions (Signed)
Your physician recommends that you schedule a follow-up appointment in: 2-3 Weeks with Dr. Domenic Polite.   Your physician recommends that you return for lab work in: [place] has recommended you make the following change in your medication:   Increase Lasix to 40 mg Two Times Daily for the next 3 Days   Increase Potassium Two Times Daily for the next 3 Days   On Day four return to Lasix 40 mg in the am and Potassium 20 mEq Daily.   If you need a refill on your cardiac medications before your next appointment, please call your pharmacy.  Thank you for choosing [facility]!

## 2016-10-11 NOTE — Progress Notes (Signed)
Name: [name]    DOB: [birth year]  Age: 82 y.o.  MR#: [id number]       PCP:  Purvis Kilts, MD      Insurance: Payor: MEDICARE / Plan: MEDICARE PART A AND B / Product Type: *No Product type* /   CC:   No chief complaint on file.   VS Vitals:   10/11/16 1332  BP: 122/64  Pulse: 98  SpO2: 95%  Weight: 237 lb (107.5 kg)  Height: '5\' 7"'$  (1.702 m)    Weights Current Weight  10/11/16 237 lb (107.5 kg)  10/07/16 232 lb (105.2 kg)  09/30/16 233 lb (105.7 kg)    Blood Pressure  BP Readings from Last 3 Encounters:  10/11/16 122/64  10/07/16 (!) 144/80  09/30/16 131/71     Admit date:  (Not on file) Last encounter with RMR:  Visit date not found   Allergy Sulfa antibiotics  Current Outpatient Prescriptions  Medication Sig Dispense Refill  . alfuzosin (UROXATRAL) 10 MG 24 hr tablet Take 10 mg by mouth daily.      Marland Kitchen apixaban (ELIQUIS) 5 MG TABS tablet Take 1 tablet (5 mg total) by mouth 2 (two) times daily. 60 tablet 1  . folic acid (FOLVITE) 564 MCG tablet Take 800 mcg by mouth daily.    . furosemide (LASIX) 20 MG tablet Take 40 mg (2 pills) in the am for 2 weeks, then reduce to 20 mg daily 90 tablet 1  . glipiZIDE (GLUCOTROL) 5 MG tablet Take 5 mg by mouth 2 (two) times daily before a meal.    . HYDROcodone-acetaminophen (NORCO) 5-325 MG tablet Take 1-2 tablets by mouth every 6 (six) hours as needed for severe pain. 20 tablet 0  . JANUMET 50-1000 MG tablet 1 tablet 2 (two) times daily.    Marland Kitchen levofloxacin (LEVAQUIN) 500 MG tablet Take 1 tablet (500 mg total) by mouth daily. 7 tablet 0  . losartan (COZAAR) 100 MG tablet Take 100 mg by mouth daily.    . metoprolol (LOPRESSOR) 50 MG tablet Take 1 tablet (50 mg total) by mouth 2 (two) times daily. 60 tablet 0  . Omega-3 Fatty Acids (FISH OIL) 1000 MG CAPS Take by mouth.    . oxybutynin (DITROPAN-XL) 10 MG 24 hr tablet Take 10 mg by mouth at bedtime.    . potassium chloride (K-DUR) 10 MEQ tablet Take 1 tablet (10 mEq total)  by mouth daily. 90 tablet 3  . simvastatin (ZOCOR) 40 MG tablet Take 40 mg by mouth at bedtime.       No current facility-administered medications for this visit.     Discontinued Meds:   There are no discontinued medications.  Patient Active Problem List   Diagnosis Date Noted  . Atrial fibrillation with rapid ventricular response ([place]) 08/25/2016  . Lung mass 08/25/2016  . Chest pain 08/25/2016  . DM type 2 (diabetes mellitus, type 2) ([place]) 08/25/2016  . Aortic atherosclerosis ([place]) 08/25/2016  . Thoracic aortic aneurysm ([place]) 08/25/2016  . Hx of cancer of lung 11/07/2011  . Hypercholesteremia   . History of kidney stones     LABS    Component Value Date/Time   NA 137 10/10/2016 1139   NA 138 10/06/2016 0743   NA 131 (L) 09/30/2016 1139   NA 131 (L) 09/14/2016 0738   NA 135 08/25/2016 0948   NA 139 10/29/2014 0759   K 4.4 10/10/2016 1139   K 4.3 10/06/2016 0743   K 4.2 09/30/2016  1139   K 4.3 09/14/2016 0738   K 4.0 08/25/2016 0948   K 4.3 10/29/2014 0759   CL 99 10/10/2016 1139   CL 96 (L) 09/30/2016 1139   CL 100 (L) 08/25/2016 0948   CL 104 10/30/2012 0805   CL 100 10/10/2011 0733   CO2 27 10/10/2016 1139   CO2 26 10/06/2016 0743   CO2 24 09/30/2016 1139   CO2 24 09/14/2016 0738   CO2 26 08/25/2016 0948   CO2 22 10/29/2014 0759   GLUCOSE 121 (H) 10/10/2016 1139   GLUCOSE 124 10/06/2016 0743   GLUCOSE 187 (H) 09/30/2016 1139   GLUCOSE 90 09/14/2016 0738   GLUCOSE 171 (H) 08/25/2016 0948   GLUCOSE 145 (H) 10/29/2014 0759   GLUCOSE 184 (H) 10/30/2012 0805   GLUCOSE 170 (H) 10/10/2011 0733   BUN 25 10/10/2016 1139   BUN 23.9 10/06/2016 0743   BUN 26 (H) 09/30/2016 1139   BUN 12.0 09/14/2016 0738   BUN 19 08/25/2016 0948   BUN 22.1 10/29/2014 0759   CREATININE 1.17 (H) 10/10/2016 1139   CREATININE 0.8 10/06/2016 0743   CREATININE 1.01 09/30/2016 1139   CREATININE 0.8 09/14/2016 0738   CREATININE 0.80 08/25/2016 0948   CREATININE 0.8 10/29/2014 0759    CALCIUM 10.2 10/10/2016 1139   CALCIUM 10.5 (H) 10/06/2016 0743   CALCIUM 11.0 (H) 09/30/2016 1139   CALCIUM 9.9 09/14/2016 0738   CALCIUM 10.3 08/25/2016 0948   CALCIUM 10.4 10/29/2014 0759   GFRNONAA >60 09/30/2016 1139   GFRNONAA >60 08/25/2016 0948   GFRAA >60 09/30/2016 1139   GFRAA >60 08/25/2016 0948   CMP     Component Value Date/Time   NA 137 10/10/2016 1139   NA 138 10/06/2016 0743   K 4.4 10/10/2016 1139   K 4.3 10/06/2016 0743   CL 99 10/10/2016 1139   CL 104 10/30/2012 0805   CO2 27 10/10/2016 1139   CO2 26 10/06/2016 0743   GLUCOSE 121 (H) 10/10/2016 1139   GLUCOSE 124 10/06/2016 0743   GLUCOSE 184 (H) 10/30/2012 0805   BUN 25 10/10/2016 1139   BUN 23.9 10/06/2016 0743   CREATININE 1.17 (H) 10/10/2016 1139   CREATININE 0.8 10/06/2016 0743   CALCIUM 10.2 10/10/2016 1139   CALCIUM 10.5 (H) 10/06/2016 0743   PROT 6.5 10/06/2016 0743   ALBUMIN 3.1 (L) 10/06/2016 0743   AST 14 10/06/2016 0743   ALT 17 10/06/2016 0743   ALKPHOS 55 10/06/2016 0743   BILITOT 0.51 10/06/2016 0743   GFRNONAA >60 09/30/2016 1139   GFRAA >60 09/30/2016 1139       Component Value Date/Time   WBC 10.0 10/07/2016 0953   WBC 8.3 10/06/2016 0743   WBC 14.6 (H) 09/30/2016 1139   WBC 8.6 09/14/2016 0738   WBC 12.4 (H) 08/25/2016 0948   HGB 13.0 10/07/2016 0953   HGB 12.3 (L) 10/06/2016 0743   HGB 13.7 09/30/2016 1139   HGB 12.8 (L) 09/14/2016 0738   HGB 13.0 08/25/2016 0948   HGB 13.2 10/29/2014 0759   HCT 40.5 10/07/2016 0953   HCT 38.1 (L) 10/06/2016 0743   HCT 41.9 09/30/2016 1139   HCT 38.9 09/14/2016 0738   HCT 40.0 08/25/2016 0948   HCT 40.8 10/29/2014 0759   MCV 81.5 10/07/2016 0953   MCV 81.1 10/06/2016 0743   MCV 83.0 09/30/2016 1139   MCV 81.0 09/14/2016 0738   MCV 83.9 08/25/2016 0948   MCV 82.2 10/29/2014 0759    Lipid Panel  No results found for: CHOL, TRIG, HDL, CHOLHDL, VLDL, LDLCALC, LDLDIRECT  ABG No results found for: PHART, PCO2ART, PO2ART, HCO3,  TCO2, ACIDBASEDEF, O2SAT   Lab Results  Component Value Date   TSH 1.225 08/25/2016   BNP (last 3 results)  Recent Labs  09/30/16 1136  BNP 72.0    ProBNP (last 3 results) No results for input(s): PROBNP in the last 8760 hours.  Cardiac Panel (last 3 results) No results for input(s): CKTOTAL, CKMB, TROPONINI, RELINDX in the last 72 hours.  Iron/TIBC/Ferritin/ %Sat No results found for: IRON, TIBC, FERRITIN, IRONPCTSAT   EKG Orders placed or performed during the hospital encounter of 09/30/16  . EKG 12-Lead  . EKG 12-Lead  . EKG     Prior Assessment and Plan Problem List as of 10/11/2016 Reviewed: 09/23/2016 10:04 AM by Eilleen Kempf., MD     Cardiovascular and Mediastinum   Atrial fibrillation with rapid ventricular response (HCC)   Aortic atherosclerosis (HCC)   Thoracic aortic aneurysm (HCC)     Endocrine   DM type 2 (diabetes mellitus, type 2) ([place])     Other   Hypercholesteremia   History of kidney stones   Hx of cancer of lung   Lung mass   Chest pain       Imaging: Dg Chest 2 View  Result Date: 09/30/2016 CLINICAL DATA:  Right-sided chest pain. EXAM: CHEST  2 VIEW COMPARISON:  PET-CT 09/21/2016 FINDINGS: Anterior right upper lobe mass with multiple radiation seeds along the periphery. Small right pleural effusion. No left pleural effusion. No other focal parenchymal opacity. No pneumothorax. Stable cardiomediastinal silhouette. No acute osseous abnormality. IMPRESSION: 1. Stable anterior right upper lobe mass. Electronically Signed   By: Kathreen Devoid   On: 09/30/2016 12:42   Ct Angio Chest Pe W And/or Wo Contrast  Result Date: 09/30/2016 CLINICAL DATA:  RIGHT chest pain and productive cough for 1 day. History of lung cancer 2008 VATS and brachytherapy, thoracic aortic aneurysm, hypertension and diabetes. EXAM: CT ANGIOGRAPHY CHEST WITH CONTRAST TECHNIQUE: Multidetector CT imaging of the chest was performed using the standard protocol during bolus  administration of intravenous contrast. Multiplanar CT image reconstructions and MIPs were obtained to evaluate the vascular anatomy. CONTRAST:  50 cc Isovue 370 COMPARISON:  Chest radiograph September 30, 2016 at 1211 hours and CT chest August 25, 2016 FINDINGS: CARDIOVASCULAR: Adequate contrast opacification of the pulmonary artery's. Main pulmonary artery is not enlarged. No pulmonary arterial filling defects to the level of the subsegmental branches. Heart size is mildly enlarged, no right heart strain. Severe coronary artery calcifications. Enlarging pericardial effusion was 11 mm, now 22 mm. Thoracic aorta is 4.2 cm, stable. MEDIASTINUM/NODES: New 17 mm short access anterior mediastinal lymph node medial to the upper lobe mass. New 14 mm cardiophrenic angle lymph node. Additional greater than expected number of mediastinal lymph nodes measuring up to 12 mm. LUNGS/PLEURA: RIGHT upper lobe pleural based 8.9 x 4.2 cm mass was 8.2 x 3.8 cm. Brachytherapy seeds along the anterior superior aspect of the mass, status post RIGHT upper lobe wedge resection. Similar RIGHT upper lobe centrilobular nodules deep to the mass. Increasing small to moderate RIGHT, stable small LEFT pleural effusion. Mild centrilobular emphysema. Stable 5 mm RIGHT upper lobe ground-glass nodule. Diffuse bronchial wall thickening, tracheal bronchial stable 10 mm nodule abutting the pleural surface LEFT lower lobe. No pneumothorax. UPPER ABDOMEN: Included view of the abdomen is nonacute. RIGHT upper pole renal cyst incompletely characterized. Abdominal suture material. No adrenal mass  though incompletely imaged. MUSCULOSKELETAL: Visualized soft tissues and included osseous structures nonacute. Gynecomastia. Multilevel Schmorl's nodes. Review of the MIP images confirms the above findings. IMPRESSION: No acute pulmonary embolism. Enlarging moderate pericardial effusion. Enlarging 8.9 x 4.2 cm RIGHT upper lobe mass, with new pathologic  lymphadenopathy consistent with disease progression and new metastasis. Similar RIGHT upper lobe tree-in-bud infiltrates concerning for lymphangitic spread of tumor. Increasing small to moderate RIGHT and similar small LEFT pleural effusion concerning for malignant effusion. Stable 4.2 cm ascending aortic aneurysm. Recommend annual imaging followup by CTA or MRA. This recommendation follows 2010 ACCF/AHA/AATS/ACR/ASA/SCA/SCAI/SIR/STS/SVM Guidelines for the Diagnosis and Management of Patients with Thoracic Aortic Disease. Circulation. 2010; 121: Q947-M546 Ascending thoracic aortic aneurysm. Electronically Signed   By: Elon Alas M.D.   On: 09/30/2016 14:48   Nm Pet Image Restag (ps) Skull Base To Thigh  Result Date: 09/21/2016 CLINICAL DATA:  Subsequent treatment strategy for restaging of lung cancer. Status post radiation seed implants. EXAM: NUCLEAR MEDICINE PET SKULL BASE TO THIGH TECHNIQUE: 11.9 mCi F-18 FDG was injected intravenously. Full-ring PET imaging was performed from the skull base to thigh after the radiotracer. CT data was obtained and used for attenuation correction and anatomic localization. FASTING BLOOD GLUCOSE:  Value: 122 mg/dl COMPARISON:  08/25/2016 chest CT.  PET of 11/16/2006. FINDINGS: NECK No areas of abnormal hypermetabolism. CHEST Hypermetabolism corresponding to the pleural-based anterior right upper lobe along the mass. This measures on the order of 8.3 x 4.2 cm and a S.U.V. max of 17.9 on image 72/ series 4. Multiple right-sided hypermetabolic pleural implants. Example measuring 1.3 cm and a S.U.V. max of 11.1 on image 89/series 4. Low right mediastinal and infrahilar hypermetabolic nodes, including at a S.U.V. max of 5.7 on image 82/ series 4. ABDOMEN/PELVIS No adrenal hypermetabolism. Colonic hypermetabolism is diffuse and likely physiologic. SKELETON No abnormal marrow activity. CT IMAGES PERFORMED FOR ATTENUATION CORRECTION No cervical adenopathy. Carotid  atherosclerosis bilaterally. Chest findings deferred to recent diagnostic CT. Right-sided pleural effusion is minimally increased. Radiation seeds in the anterior right upper lobe. Cardiomegaly. Increase in moderate pericardial effusion. Multivessel coronary artery atherosclerosis. Pulmonary artery enlargement, 4.2 cm outflow tract. Bilateral large volume renal collecting system calculi. Bilateral low-density renal lesions are likely cysts. Advanced abdominal aortic and branch vessel atherosclerosis. Moderate prostatomegaly. Right-sided bladder calculi. Minimal chronic right-sided caliectasis is similar and likely relates to a mild ureteropelvic junction obstruction. Large colonic stool burden, especially distally. IMPRESSION: 1. Pleural-based right upper lobe lung hypermetabolic mass which is favored to represent a metachronous primary bronchogenic carcinoma. 2. Right pleural and thoracic nodal metastasis. 3. No hypermetabolic extrathoracic metastasis identified. 4. Incidental findings, including urinary tract calculi, chronic right ureteropelvic junction obstruction, and atherosclerosis. 5. Enlargement of a moderate pericardial and small right pleural effusion. 6. Pulmonary artery enlargement suggests pulmonary arterial hypertension. Electronically Signed   By: Abigail Miyamoto M.D.   On: 09/21/2016 09:28   Ct Biopsy  Result Date: 10/07/2016 INDICATION: PET positive recurrent left upper lobe anterior mass concerning for lung cancer recurrence. EXAM: CT-GUIDED BIOPSY ANTERIOR LEFT UPPER LOBE MASS BIOPSY MEDICATIONS: 1% LIDOCAINE LOCALLY ANESTHESIA/SEDATION: 0.5 mg IV Versed; 50 mcg IV Fentanyl Moderate Sedation Time:  10 MINUTES The patient was continuously monitored during the procedure by the interventional radiology nurse under my direct supervision. PROCEDURE: The procedure, risks, benefits, and alternatives were explained to the patient. Questions regarding the procedure were encouraged and answered. The  patient understands and consents to the procedure. previous imaging reviewed. patient positioned supine. noncontrast localization  CT performed. the right upper lobe anterior mass was localized. overlying skin was marked. The RIGHT ANTERIOR CHEST was prepped with ChloraPrep in a sterile fashion, and a sterile drape was applied covering the operative field. A sterile gown and sterile gloves were used for the procedure. Under sterile conditions and local anesthesia, a 17 gauge 6.8 cm access needle was advanced from a anterior oblique approach into the right upper lobe mass. Needle position confirmed with CT. 2 18 gauge core biopsies obtained. Samples were intact and non fragmented. Needle removed. Patient tolerated the procedure well without complication. Vital sign monitoring by nursing staff during the procedure will continue as patient is in the special procedures unit for post procedure observation. FINDINGS: The images document guide needle placement within the anterior right upper lobe mass. Post biopsy images demonstrate complication or pneumothorax. Stable pleural effusions and pericardial effusion. COMPLICATIONS: None immediate. IMPRESSION: Successful CT-guided anterior right upper lobe mass 18 gauge core biopsies. Electronically Signed   By: Jerilynn Mages.  Shick M.D.   On: 10/07/2016 14:16

## 2016-10-11 NOTE — Progress Notes (Signed)
Cardiology Office Note   Date:  10/11/2016   ID:  SHIMSHON NARULA, DOB [birth year], MRN [id number]  PCP:  Purvis Kilts, MD  Cardiologist:  [place]/ Jory Sims, NP   Chief Complaint  Patient presents with  . Edema  . Hypertension      History of Present Illness: RENAN DANESE is a 82 y.o. male who presents for ongoing assessment and management of, newly documented atrial fibrillation, CHADS VASC score of 5, multivessel this abuse and coronary artery disease, thoracic aortic aneurysm measuring 4.2 cm, systolic dysfunction with EF of 45-50%, fluid retention of the lower extremities, with recurrent lung cancer ink followed by Dr. Earlie Server.  He was originally seen in our office by Dr. Domenic Polite on 09/26/2016 for initial encounter. He was continued on metoprolol and ELIQUIS as his heart rate was well-controlled. Due to lower extremity edema and evidence of fluid retention the patient was started on Lasix 40 mg daily with 10 mEq of potassium daily for the next few weeks with a follow-up BMET, due to multivessel coronary artery disease found on CT scan he was going to be treated medically, continued on beta blocker and ARB.   Follow-up BMET: Completed on 10/10/2016:  Sodium 137, potassium 4.4, chloride 99, CO2 27 BUN 25, creatinine 1.17, glucose 121. Weight on last office visit 247 pounds.  He comes today with continued severe lower extremity edema. The patient continues to take 40 mg of Lasix daily and has gained approximately 5 pounds since being seen last. The patient states that he has lost weight at home on his scale. Lower extremity edema remains prominent but no weeping or skin tearing is noted. He denies pain or numbness in the lower extremities.  Past Medical History:  Diagnosis Date  . Colon polyps   . Enlarged prostate   . Essential hypertension   . History of kidney stones   . Hypercholesteremia   . Lung cancer ([place]) Dx'd 01/2007   Stage IA non-small cell lung  cancer, moderately differentiated adenocarcinoma - VATS and seed implants  . Thoracic aortic aneurysm ([facility])    4.2 cm in AP diameter   . Type 2 diabetes mellitus ([place])     Past Surgical History:  Procedure Laterality Date  . APPENDECTOMY    . CERVICAL SPINE SURGERY    . CHOLECYSTECTOMY    . COLONOSCOPY  04/17/2012   Procedure: COLONOSCOPY;  Surgeon: Jamesetta So, MD;  Location: [facility];  Service: Gastroenterology;  Laterality: N/A;  . COLONOSCOPY W/ POLYPECTOMY    . TONSILLECTOMY    . wedge resection with seed implantation and node sampling with right VATS  03/13/2007     Current Outpatient Prescriptions  Medication Sig Dispense Refill  . alfuzosin (UROXATRAL) 10 MG 24 hr tablet Take 10 mg by mouth daily.      Marland Kitchen apixaban (ELIQUIS) 5 MG TABS tablet Take 1 tablet (5 mg total) by mouth 2 (two) times daily. 60 tablet 1  . folic acid (FOLVITE) 979 MCG tablet Take 800 mcg by mouth daily.    . furosemide (LASIX) 20 MG tablet Take 40 mg (2 pills) in the am for 2 weeks, then reduce to 20 mg daily 90 tablet 1  . glipiZIDE (GLUCOTROL) 5 MG tablet Take 5 mg by mouth 2 (two) times daily before a meal.    . HYDROcodone-acetaminophen (NORCO) 5-325 MG tablet Take 1-2 tablets by mouth every 6 (six) hours as needed for severe pain. 20 tablet 0  . JANUMET  50-1000 MG tablet 1 tablet 2 (two) times daily.    Marland Kitchen levofloxacin (LEVAQUIN) 500 MG tablet Take 1 tablet (500 mg total) by mouth daily. 7 tablet 0  . losartan (COZAAR) 100 MG tablet Take 100 mg by mouth daily.    . metoprolol (LOPRESSOR) 50 MG tablet Take 1 tablet (50 mg total) by mouth 2 (two) times daily. 60 tablet 0  . Omega-3 Fatty Acids (FISH OIL) 1000 MG CAPS Take by mouth.    . oxybutynin (DITROPAN-XL) 10 MG 24 hr tablet Take 10 mg by mouth at bedtime.    . potassium chloride (K-DUR) 10 MEQ tablet Take 1 tablet (10 mEq total) by mouth daily. 90 tablet 3  . simvastatin (ZOCOR) 40 MG tablet Take 40 mg by mouth at bedtime.       No  current facility-administered medications for this visit.     Allergies:   Sulfa antibiotics    Social History:  The patient  reports that he quit smoking about 51 years ago. His smoking use included Cigarettes. He has a 22.50 pack-year smoking history. He has never used smokeless tobacco. He reports that he does not drink alcohol or use drugs.   Family History:  The patient's family history is not on file.    ROS: All other systems are reviewed and negative. Unless otherwise mentioned in H&P    PHYSICAL EXAM: VS:  BP 122/64   Pulse 98   Ht '5\' 7"'$  (1.702 m)   Wt 237 lb (107.5 kg)   SpO2 95%   BMI 37.12 kg/m  , BMI Body mass index is 37.12 kg/m. GEN: Well nourished, well developed, in no acute distress  HEENT: normal  Neck: no JVD, carotid bruits, or masses Cardiac:IRRR; no murmurs, rubs, or gallops, 3+ pretibial edema to the knees bilaterally, 3+ ankle and pedal edema. Difficult to palpate pulses. Respiratory:  clear to auscultation bilaterally, normal work of breathing GI: soft, nontender, nondistended, + BS MS: no deformity or atrophy  Skin: warm and dry, no rash Neuro:  Strength and sensation are intact Psych: euthymic mood, full affect  Recent Labs: 08/25/2016: TSH 1.225 09/30/2016: B Natriuretic Peptide 72.0 10/06/2016: ALT 17 10/07/2016: Hemoglobin 13.0; Platelets 318 10/10/2016: BUN 25; Creat 1.17; Potassium 4.4; Sodium 137    Lipid Panel No results found for: CHOL, TRIG, HDL, CHOLHDL, VLDL, LDLCALC, LDLDIRECT    Wt Readings from Last 3 Encounters:  10/11/16 237 lb (107.5 kg)  10/07/16 232 lb (105.2 kg)  09/30/16 233 lb (105.7 kg)      Other studies Reviewed: Echocardiogram 09-11-2016: Study Conclusions  - Left ventricle: The cavity size was normal. Wall thickness was increased in a pattern of mild LVH. Systolic function was mildly reduced. The estimated ejection fraction was in the range of 45% to 50%. Although no diagnostic regional wall motion  abnormality was identified, this possibility cannot be completely excluded on the basis of this study. The study is not technically sufficient to allow evaluation of LV diastolic function. - Aortic valve: Moderately calcified annulus. Probably trileaflet; mildly calcified leaflets. There was mild stenosis. There was trivial regurgitation. Mean gradient (S): 8 mm Hg. Peak gradient (S): 15 mm Hg. VTI ratio of LVOT to aortic valve: 0.46. Valve area (VTI): 1.46 cm^2. Valve area (Vmax): 1.46 cm^2. Valve area (Vmean): 1.64 cm^2. - Aortic root: The aortic root was mildly dilated. - Mitral valve: Severely calcified annulus. There was trivial regurgitation. - Left atrium: The atrium was mildly dilated. - Right atrium: The atrium  was mildly dilated. Central venous pressure (est): 8 mm Hg. - Tricuspid valve: There was mild regurgitation. - Pulmonary arteries: PA peak pressure: 39 mm Hg (S). - Pericardium, extracardiac: A small pericardial effusion was identified posterior to the heart.  Impressions:  - Mild LVH with LVEF approximately 45-50% in the setting of atrial fibrillation. Indeterminate diastolic function. Mild left atrial enlargement. Severe MAC with trivial mitral regurgitation. Mild calcific aortic stenosis with trivial aortic regurgitation. Mildly dilated aortic root. Mild tricuspid regurgitation with PASP 39 mmHg. Small posterior pericardial effusion.  ASSESSMENT AND PLAN:  1. Chronic lower extremity edema: Severe bilateral lower extremity edema to the knees, 3+ pretibial through the ankles into the feet. Unable to palpate pulses. He denies pain. I will increase his Lasix to 40 mg twice a day with 20 mEq of potassium twice a day for the next 3 days. He is to avoid salty foods. Follow-up CMET will be ordered in one week. After 3 days he will call back to 40 mg daily with 20 mEq of potassium daily. He will follow-up with Dr. Domenic Polite in 2 weeks.  2.  Hypertension: Currently well-controlled. Continue ARB.  3. Atrial fibrillation: Heart rate is well controlled. He will continue on ELIQUIS 5 mg twice a day. CBC will be ordered for evaluation of anemia with lower extremity edema and mild dyspnea.   Current medicines are reviewed at length with the patient today.    Labs/ tests ordered today include:   Orders Placed This Encounter  Procedures  . Comprehensive Metabolic Panel (CMET)  . CBC with Differential     Disposition:   FU with 2-3 weeks with Dr. Domenic Polite. Olena Heckle, NP  10/11/2016 2:49 PM    [facility] [address], [place], [place] [address] Phone: [phone]; Fax: [phone]

## 2016-10-13 ENCOUNTER — Ambulatory Visit (HOSPITAL_BASED_OUTPATIENT_CLINIC_OR_DEPARTMENT_OTHER): Payer: Medicare Other | Admitting: Internal Medicine

## 2016-10-13 ENCOUNTER — Telehealth: Payer: Self-pay | Admitting: Internal Medicine

## 2016-10-13 ENCOUNTER — Encounter: Payer: Self-pay | Admitting: Internal Medicine

## 2016-10-13 DIAGNOSIS — R591 Generalized enlarged lymph nodes: Secondary | ICD-10-CM

## 2016-10-13 DIAGNOSIS — C3411 Malignant neoplasm of upper lobe, right bronchus or lung: Secondary | ICD-10-CM | POA: Diagnosis not present

## 2016-10-13 DIAGNOSIS — C782 Secondary malignant neoplasm of pleura: Secondary | ICD-10-CM | POA: Diagnosis not present

## 2016-10-13 DIAGNOSIS — C3491 Malignant neoplasm of unspecified part of right bronchus or lung: Secondary | ICD-10-CM

## 2016-10-13 DIAGNOSIS — I10 Essential (primary) hypertension: Secondary | ICD-10-CM | POA: Diagnosis not present

## 2016-10-13 DIAGNOSIS — J449 Chronic obstructive pulmonary disease, unspecified: Secondary | ICD-10-CM

## 2016-10-13 DIAGNOSIS — R600 Localized edema: Secondary | ICD-10-CM | POA: Diagnosis not present

## 2016-10-13 DIAGNOSIS — R0602 Shortness of breath: Secondary | ICD-10-CM | POA: Insufficient documentation

## 2016-10-13 HISTORY — DX: Malignant neoplasm of unspecified part of right bronchus or lung: C34.91

## 2016-10-13 MED ORDER — ALBUTEROL SULFATE HFA 108 (90 BASE) MCG/ACT IN AERS: 2.0000 | INHALATION_SPRAY | Freq: Four times a day (QID) | RESPIRATORY_TRACT | 2 refills | Status: AC | PRN

## 2016-10-13 NOTE — Progress Notes (Signed)
[facility] [phone]   [phone]  OFFICE PROGRESS NOTE  Purvis Kilts, MD [address] [place] [address]  DIAGNOSIS: Metastatic (T3, N2, M1a) poorly differentiated adenocarcinoma diagnosed in November 2017. The patient was initially diagnosed as Stage IA non-small cell lung cancer, moderately differentiated adenocarcinoma, diagnosed in April 2008.   PRIOR THERAPY: Status post wedge resection of the right upper lobe with seed implants under the care of Dr Arlyce Dice on October 13, 2007.   CURRENT THERAPY: Observation.  INTERVAL HISTORY: [name] 82 y.o. male returns to the clinic today for followup visit. The patient is feeling fine today was no specific complaints except for mild shortness of breath and right-sided chest pain. He recently underwent CT-guided core biopsy of the right upper lobe lung mass by interventional radiology and the final pathology was consistent with poorly differentiated adenocarcinoma. He denied having any significant weight loss or night sweats. He has no nausea or vomiting. He has no hemoptysis. He is here today for evaluation and discussion of his treatment options based on the final pathology.  MEDICAL HISTORY: Past Medical History:  Diagnosis Date  . Colon polyps   . Enlarged prostate   . Essential hypertension   . History of kidney stones   . Hypercholesteremia   . Lung cancer ([facility]) Dx'd 01/2007   Stage IA non-small cell lung cancer, moderately differentiated adenocarcinoma - VATS and seed implants  . Thoracic aortic aneurysm ([facility])    4.2 cm in [facility] diameter   . Type 2 diabetes mellitus ([facility])     ALLERGIES:  is allergic to sulfa antibiotics.  MEDICATIONS:  Current Outpatient Prescriptions  Medication Sig Dispense Refill  . alfuzosin (UROXATRAL) 10 MG 24 hr tablet Take 10 mg by mouth daily.      Marland Kitchen apixaban (ELIQUIS) 5 MG TABS tablet Take 1 tablet (5 mg total) by mouth 2 (two) times  daily. 60 tablet 1  . folic acid (FOLVITE) 856 MCG tablet Take 800 mcg by mouth daily.    . furosemide (LASIX) 20 MG tablet Take 40 mg (2 pills) in the am for 2 weeks, then reduce to 20 mg daily 90 tablet 1  . glipiZIDE (GLUCOTROL) 5 MG tablet Take 5 mg by mouth 2 (two) times daily before a meal.    . HYDROcodone-acetaminophen (NORCO) 5-325 MG tablet Take 1-2 tablets by mouth every 6 (six) hours as needed for severe pain. 20 tablet 0  . JANUMET 50-1000 MG tablet 1 tablet 2 (two) times daily.    Marland Kitchen levofloxacin (LEVAQUIN) 500 MG tablet Take 1 tablet (500 mg total) by mouth daily. 7 tablet 0  . losartan (COZAAR) 100 MG tablet Take 100 mg by mouth daily.    . metoprolol (LOPRESSOR) 50 MG tablet Take 1 tablet (50 mg total) by mouth 2 (two) times daily. 60 tablet 0  . Omega-3 Fatty Acids (FISH OIL) 1000 MG CAPS Take by mouth.    . oxybutynin (DITROPAN-XL) 10 MG 24 hr tablet Take 10 mg by mouth at bedtime.    . potassium chloride (K-DUR) 10 MEQ tablet Take 1 tablet (10 mEq total) by mouth daily. 90 tablet 3  . simvastatin (ZOCOR) 40 MG tablet Take 40 mg by mouth at bedtime.       No current facility-administered medications for this visit.     SURGICAL HISTORY:  Past Surgical History:  Procedure Laterality Date  . APPENDECTOMY    . CERVICAL SPINE SURGERY    .  CHOLECYSTECTOMY    . COLONOSCOPY  04/17/2012   Procedure: COLONOSCOPY;  Surgeon: Jamesetta So, MD;  Location: [facility];  Service: Gastroenterology;  Laterality: N/A;  . COLONOSCOPY W/ POLYPECTOMY    . TONSILLECTOMY    . wedge resection with seed implantation and node sampling with right VATS  03/13/2007    REVIEW OF SYSTEMS:  Constitutional: positive for fatigue Eyes: negative Ears, nose, mouth, throat, and face: negative Respiratory: positive for cough, dyspnea on exertion, pleurisy/chest pain and sputum Cardiovascular: negative Gastrointestinal: negative Genitourinary:negative Integument/breast:  negative Hematologic/lymphatic: negative Musculoskeletal:negative Neurological: negative Behavioral/Psych: negative Endocrine: negative Allergic/Immunologic: negative   PHYSICAL EXAMINATION: General appearance: alert and fatigued Head: Normocephalic, without obvious abnormality, atraumatic Neck: no adenopathy Lymph nodes: Cervical, supraclavicular, and axillary nodes normal. Resp: wheezes bilaterally Back: symmetric, no curvature. ROM normal. No CVA tenderness. Cardio: regular rate and rhythm, S1, S2 normal, no murmur, click, rub or gallop GI: soft, non-tender; bowel sounds normal; no masses,  no organomegaly Extremities: extremities normal, atraumatic, no cyanosis or edema Neurologic: Alert and oriented X 3, normal strength and tone. Normal symmetric reflexes. Normal coordination and gait  ECOG PERFORMANCE STATUS: 1 - Symptomatic but completely ambulatory  There were no vitals taken for this visit.  LABORATORY DATA: Lab Results  Component Value Date   WBC 10.0 10/07/2016   HGB 13.0 10/07/2016   HCT 40.5 10/07/2016   MCV 81.5 10/07/2016   PLT 318 10/07/2016      Chemistry      Component Value Date/Time   NA 137 10/10/2016 1139   NA 138 10/06/2016 0743   K 4.4 10/10/2016 1139   K 4.3 10/06/2016 0743   CL 99 10/10/2016 1139   CL 104 10/30/2012 0805   CO2 27 10/10/2016 1139   CO2 26 10/06/2016 0743   BUN 25 10/10/2016 1139   BUN 23.9 10/06/2016 0743   CREATININE 1.17 (H) 10/10/2016 1139   CREATININE 0.8 10/06/2016 0743      Component Value Date/Time   CALCIUM 10.2 10/10/2016 1139   CALCIUM 10.5 (H) 10/06/2016 0743   ALKPHOS 55 10/06/2016 0743   AST 14 10/06/2016 0743   ALT 17 10/06/2016 0743   BILITOT 0.51 10/06/2016 0743       RADIOGRAPHIC STUDIES: Dg Chest 2 View  Result Date: 09/30/2016 CLINICAL DATA:  Right-sided chest pain. EXAM: CHEST  2 VIEW COMPARISON:  PET-CT 09/21/2016 FINDINGS: Anterior right upper lobe mass with multiple radiation seeds along  the periphery. Small right pleural effusion. No left pleural effusion. No other focal parenchymal opacity. No pneumothorax. Stable cardiomediastinal silhouette. No acute osseous abnormality. IMPRESSION: 1. Stable anterior right upper lobe mass. Electronically Signed   By: Kathreen Devoid   On: 09/30/2016 12:42   Ct Angio Chest Pe W And/or Wo Contrast  Result Date: 09/30/2016 CLINICAL DATA:  RIGHT chest pain and productive cough for 1 day. History of lung cancer 2008 VATS and brachytherapy, thoracic aortic aneurysm, hypertension and diabetes. EXAM: CT ANGIOGRAPHY CHEST WITH CONTRAST TECHNIQUE: Multidetector CT imaging of the chest was performed using the standard protocol during bolus administration of intravenous contrast. Multiplanar CT image reconstructions and MIPs were obtained to evaluate the vascular anatomy. CONTRAST:  50 cc Isovue 370 COMPARISON:  Chest radiograph September 30, 2016 at 1211 hours and CT chest August 25, 2016 FINDINGS: CARDIOVASCULAR: Adequate contrast opacification of the pulmonary artery's. Main pulmonary artery is not enlarged. No pulmonary arterial filling defects to the level of the subsegmental branches. Heart size is mildly enlarged, no  right heart strain. Severe coronary artery calcifications. Enlarging pericardial effusion was 11 mm, now 22 mm. Thoracic aorta is 4.2 cm, stable. MEDIASTINUM/NODES: New 17 mm short access anterior mediastinal lymph node medial to the upper lobe mass. New 14 mm cardiophrenic angle lymph node. Additional greater than expected number of mediastinal lymph nodes measuring up to 12 mm. LUNGS/PLEURA: RIGHT upper lobe pleural based 8.9 x 4.2 cm mass was 8.2 x 3.8 cm. Brachytherapy seeds along the anterior superior aspect of the mass, status post RIGHT upper lobe wedge resection. Similar RIGHT upper lobe centrilobular nodules deep to the mass. Increasing small to moderate RIGHT, stable small LEFT pleural effusion. Mild centrilobular emphysema. Stable 5 mm  RIGHT upper lobe ground-glass nodule. Diffuse bronchial wall thickening, tracheal bronchial stable 10 mm nodule abutting the pleural surface LEFT lower lobe. No pneumothorax. UPPER ABDOMEN: Included view of the abdomen is nonacute. RIGHT upper pole renal cyst incompletely characterized. Abdominal suture material. No adrenal mass though incompletely imaged. MUSCULOSKELETAL: Visualized soft tissues and included osseous structures nonacute. Gynecomastia. Multilevel Schmorl's nodes. Review of the MIP images confirms the above findings. IMPRESSION: No acute pulmonary embolism. Enlarging moderate pericardial effusion. Enlarging 8.9 x 4.2 cm RIGHT upper lobe mass, with new pathologic lymphadenopathy consistent with disease progression and new metastasis. Similar RIGHT upper lobe tree-in-bud infiltrates concerning for lymphangitic spread of tumor. Increasing small to moderate RIGHT and similar small LEFT pleural effusion concerning for malignant effusion. Stable 4.2 cm ascending aortic aneurysm. Recommend annual imaging followup by CTA or MRA. This recommendation follows 2010 ACCF/AHA/AATS/ACR/ASA/SCA/SCAI/SIR/STS/SVM Guidelines for the Diagnosis and Management of Patients with Thoracic Aortic Disease. Circulation. 2010; 121: [id number] Ascending thoracic aortic aneurysm. Electronically Signed   By: Elon Alas M.D.   On: 09/30/2016 14:48   Nm Pet Image Restag (ps) Skull Base To Thigh  Result Date: 09/21/2016 CLINICAL DATA:  Subsequent treatment strategy for restaging of lung cancer. Status post radiation seed implants. EXAM: NUCLEAR MEDICINE PET SKULL BASE TO THIGH TECHNIQUE: 11.9 mCi F-18 FDG was injected intravenously. Full-ring PET imaging was performed from the skull base to thigh after the radiotracer. CT data was obtained and used for attenuation correction and anatomic localization. FASTING BLOOD GLUCOSE:  Value: 122 mg/dl COMPARISON:  08/25/2016 chest CT.  PET of 11/16/2006. FINDINGS: NECK No areas of  abnormal hypermetabolism. CHEST Hypermetabolism corresponding to the pleural-based anterior right upper lobe along the mass. This measures on the order of 8.3 x 4.2 cm and a S.U.V. max of 17.9 on image 72/ series 4. Multiple right-sided hypermetabolic pleural implants. Example measuring 1.3 cm and a S.U.V. max of 11.1 on image 89/series 4. Low right mediastinal and infrahilar hypermetabolic nodes, including at a S.U.V. max of 5.7 on image 82/ series 4. ABDOMEN/PELVIS No adrenal hypermetabolism. Colonic hypermetabolism is diffuse and likely physiologic. SKELETON No abnormal marrow activity. CT IMAGES PERFORMED FOR ATTENUATION CORRECTION No cervical adenopathy. Carotid atherosclerosis bilaterally. Chest findings deferred to recent diagnostic CT. Right-sided pleural effusion is minimally increased. Radiation seeds in the anterior right upper lobe. Cardiomegaly. Increase in moderate pericardial effusion. Multivessel coronary artery atherosclerosis. Pulmonary artery enlargement, 4.2 cm outflow tract. Bilateral large volume renal collecting system calculi. Bilateral low-density renal lesions are likely cysts. Advanced abdominal aortic and branch vessel atherosclerosis. Moderate prostatomegaly. Right-sided bladder calculi. Minimal chronic right-sided caliectasis is similar and likely relates to a mild ureteropelvic junction obstruction. Large colonic stool burden, especially distally. IMPRESSION: 1. Pleural-based right upper lobe lung hypermetabolic mass which is favored to represent a metachronous primary bronchogenic carcinoma.  2. Right pleural and thoracic nodal metastasis. 3. No hypermetabolic extrathoracic metastasis identified. 4. Incidental findings, including urinary tract calculi, chronic right ureteropelvic junction obstruction, and atherosclerosis. 5. Enlargement of a moderate pericardial and small right pleural effusion. 6. Pulmonary artery enlargement suggests pulmonary arterial hypertension. Electronically  Signed   By: Abigail Miyamoto M.D.   On: 09/21/2016 09:28   Ct Biopsy  Result Date: 10/07/2016 INDICATION: PET positive recurrent left upper lobe anterior mass concerning for lung cancer recurrence. EXAM: CT-GUIDED BIOPSY ANTERIOR LEFT UPPER LOBE MASS BIOPSY MEDICATIONS: 1% LIDOCAINE LOCALLY ANESTHESIA/SEDATION: 0.5 mg IV Versed; 50 mcg IV Fentanyl Moderate Sedation Time:  10 MINUTES The patient was continuously monitored during the procedure by the interventional radiology nurse under my direct supervision. PROCEDURE: The procedure, risks, benefits, and alternatives were explained to the patient. Questions regarding the procedure were encouraged and answered. The patient understands and consents to the procedure. previous imaging reviewed. patient positioned supine. noncontrast localization CT performed. the right upper lobe anterior mass was localized. overlying skin was marked. The RIGHT ANTERIOR CHEST was prepped with ChloraPrep in a sterile fashion, and a sterile drape was applied covering the operative field. A sterile gown and sterile gloves were used for the procedure. Under sterile conditions and local anesthesia, a 17 gauge 6.8 cm access needle was advanced from a anterior oblique approach into the right upper lobe mass. Needle position confirmed with CT. 2 18 gauge core biopsies obtained. Samples were intact and non fragmented. Needle removed. Patient tolerated the procedure well without complication. Vital sign monitoring by nursing staff during the procedure will continue as patient is in the special procedures unit for post procedure observation. FINDINGS: The images document guide needle placement within the anterior right upper lobe mass. Post biopsy images demonstrate complication or pneumothorax. Stable pleural effusions and pericardial effusion. COMPLICATIONS: None immediate. IMPRESSION: Successful CT-guided anterior right upper lobe mass 18 gauge core biopsies. Electronically Signed   By: Jerilynn Mages.   Shick M.D.   On: 10/07/2016 14:16   ASSESSMENT AND PLAN: This is a very pleasant 82 years old white male recently diagnosed with a stage IV (T3, N2, M1 A) poorly differentiated adenocarcinoma presented with large right upper lobe lung mass in addition to mediastinal lymphadenopathy and pleural based metastases in November 2017.  The patient has a history of of stage IA non-small cell lung cancer status post wedge resection of the right upper lobe with seed implants in November of 2008 and the patient has been observation since that time.  I had a lengthy discussion with the patient today about his current disease status and treatment options. I recommended for the patient to complete the staging workup by ordering a MRI of the brain to rule out brain metastasis. I will also ask the pathology department to send his tissue block for molecular studies and PDL1 testing. I will also refer the patient to radiation oncology for consideration of palliative radiotherapy to the obstructive right upper lobe lung mass. I will see him back for follow-up visit in 3 weeks for evaluation and discussion of his treatment options based on the final staging workup and molecular studies. For the shortness breath and COPD, I will start the patient on albuterol inhaler until he sees his primary care physician. For the lower extremity edema and hypertension, the patient was advised to follow-up with his primary care physician for management of this condition. He was advised to call immediately if he has any concerning symptoms.  All questions were answered. The  patient knows to call the clinic with any problems, questions or concerns. We can certainly see the patient much sooner if necessary. I spent 15 minutes counseling the patient face to face. The total time spent in the appointment was 25 minutes. Disclaimer: This note was dictated with voice recognition software. Similar sounding words can inadvertently be transcribed  and may be missed upon review.

## 2016-10-13 NOTE — Telephone Encounter (Signed)
Spoke with [name] @ Rad/Onc ref scheduling an appointment, and was told that she will give the patient a call with the appointment, due to not being able to schedule his appointment, as per she's covering  [place]. Patient is aware that he will receive a call from Rad/Onc, regarding his appointment. Appointments scheduled per 10/13/16 los. Copy of AVS report and appointment schedule was given to patient, per 10/13/16 los.

## 2016-10-18 DIAGNOSIS — I4891 Unspecified atrial fibrillation: Secondary | ICD-10-CM | POA: Diagnosis not present

## 2016-10-18 DIAGNOSIS — Z79899 Other long term (current) drug therapy: Secondary | ICD-10-CM | POA: Diagnosis not present

## 2016-10-18 LAB — CBC WITH DIFFERENTIAL/PLATELET
BASOS ABS: 0 {cells}/uL (ref 0–200)
Basophils Relative: 0 %
EOS PCT: 1 %
Eosinophils Absolute: 106 cells/uL (ref 15–500)
HCT: 39.4 % (ref 38.5–50.0)
Hemoglobin: 12.6 g/dL — ABNORMAL LOW (ref 13.2–17.1)
Lymphocytes Relative: 23 %
Lymphs Abs: 2438 cells/uL (ref 850–3900)
MCH: 25.6 pg — ABNORMAL LOW (ref 27.0–33.0)
MCHC: 32 g/dL (ref 32.0–36.0)
MCV: 80.1 fL (ref 80.0–100.0)
MONOS PCT: 8 %
MPV: 9.1 fL (ref 7.5–12.5)
Monocytes Absolute: 848 cells/uL (ref 200–950)
NEUTROS ABS: 7208 {cells}/uL (ref 1500–7800)
NEUTROS PCT: 68 %
PLATELETS: 330 10*3/uL (ref 140–400)
RBC: 4.92 MIL/uL (ref 4.20–5.80)
RDW: 14.9 % (ref 11.0–15.0)
WBC: 10.6 10*3/uL (ref 3.8–10.8)

## 2016-10-19 DIAGNOSIS — H40011 Open angle with borderline findings, low risk, right eye: Secondary | ICD-10-CM | POA: Diagnosis not present

## 2016-10-19 DIAGNOSIS — E119 Type 2 diabetes mellitus without complications: Secondary | ICD-10-CM | POA: Diagnosis not present

## 2016-10-19 DIAGNOSIS — Z0100 Encounter for examination of eyes and vision without abnormal findings: Secondary | ICD-10-CM | POA: Diagnosis not present

## 2016-10-19 DIAGNOSIS — H40012 Open angle with borderline findings, low risk, left eye: Secondary | ICD-10-CM | POA: Diagnosis not present

## 2016-10-19 LAB — COMPREHENSIVE METABOLIC PANEL
ALK PHOS: 48 U/L (ref 40–115)
ALT: 10 U/L (ref 9–46)
AST: 13 U/L (ref 10–35)
Albumin: 3.6 g/dL (ref 3.6–5.1)
BILIRUBIN TOTAL: 0.6 mg/dL (ref 0.2–1.2)
BUN: 22 mg/dL (ref 7–25)
CALCIUM: 9.8 mg/dL (ref 8.6–10.3)
CO2: 28 mmol/L (ref 20–31)
Chloride: 101 mmol/L (ref 98–110)
Creat: 0.9 mg/dL (ref 0.70–1.11)
GLUCOSE: 88 mg/dL (ref 65–99)
Potassium: 4.1 mmol/L (ref 3.5–5.3)
Sodium: 139 mmol/L (ref 135–146)
TOTAL PROTEIN: 6 g/dL — AB (ref 6.1–8.1)

## 2016-10-24 ENCOUNTER — Ambulatory Visit
Admission: RE | Admit: 2016-10-24 | Discharge: 2016-10-24 | Disposition: A | Discharge status: Home / Self Care | Payer: Medicare Other | Source: Ambulatory Visit | Attending: Radiation Oncology | Admitting: Radiation Oncology

## 2016-10-24 ENCOUNTER — Other Ambulatory Visit (HOSPITAL_COMMUNITY)
Admission: RE | Admit: 2016-10-24 | Discharge: 2016-10-24 | Disposition: A | Discharge status: Home / Self Care | Payer: Medicare Other | Source: Ambulatory Visit | Attending: Internal Medicine | Admitting: Internal Medicine

## 2016-10-24 ENCOUNTER — Encounter: Payer: Self-pay | Admitting: Radiation Oncology

## 2016-10-24 DIAGNOSIS — I10 Essential (primary) hypertension: Secondary | ICD-10-CM | POA: Insufficient documentation

## 2016-10-24 DIAGNOSIS — Z87442 Personal history of urinary calculi: Secondary | ICD-10-CM | POA: Diagnosis not present

## 2016-10-24 DIAGNOSIS — Z87891 Personal history of nicotine dependence: Secondary | ICD-10-CM | POA: Insufficient documentation

## 2016-10-24 DIAGNOSIS — Z9889 Other specified postprocedural states: Secondary | ICD-10-CM | POA: Insufficient documentation

## 2016-10-24 DIAGNOSIS — Z66 Do not resuscitate: Secondary | ICD-10-CM | POA: Diagnosis not present

## 2016-10-24 DIAGNOSIS — Z86010 Personal history of colonic polyps: Secondary | ICD-10-CM | POA: Insufficient documentation

## 2016-10-24 DIAGNOSIS — Z510 Encounter for antineoplastic radiation therapy: Secondary | ICD-10-CM | POA: Diagnosis not present

## 2016-10-24 DIAGNOSIS — Z9049 Acquired absence of other specified parts of digestive tract: Secondary | ICD-10-CM | POA: Diagnosis not present

## 2016-10-24 DIAGNOSIS — E7800 Pure hypercholesterolemia, unspecified: Secondary | ICD-10-CM | POA: Insufficient documentation

## 2016-10-24 DIAGNOSIS — R0789 Other chest pain: Secondary | ICD-10-CM | POA: Insufficient documentation

## 2016-10-24 DIAGNOSIS — R634 Abnormal weight loss: Secondary | ICD-10-CM | POA: Diagnosis not present

## 2016-10-24 DIAGNOSIS — Z800 Family history of malignant neoplasm of digestive organs: Secondary | ICD-10-CM | POA: Insufficient documentation

## 2016-10-24 DIAGNOSIS — C7802 Secondary malignant neoplasm of left lung: Secondary | ICD-10-CM | POA: Diagnosis not present

## 2016-10-24 DIAGNOSIS — Z923 Personal history of irradiation: Secondary | ICD-10-CM | POA: Insufficient documentation

## 2016-10-24 DIAGNOSIS — C7931 Secondary malignant neoplasm of brain: Secondary | ICD-10-CM | POA: Insufficient documentation

## 2016-10-24 DIAGNOSIS — C3411 Malignant neoplasm of upper lobe, right bronchus or lung: Secondary | ICD-10-CM | POA: Diagnosis present

## 2016-10-24 DIAGNOSIS — R530 Neoplastic (malignant) related fatigue: Secondary | ICD-10-CM | POA: Insufficient documentation

## 2016-10-24 DIAGNOSIS — N400 Enlarged prostate without lower urinary tract symptoms: Secondary | ICD-10-CM | POA: Diagnosis not present

## 2016-10-24 DIAGNOSIS — E119 Type 2 diabetes mellitus without complications: Secondary | ICD-10-CM | POA: Diagnosis not present

## 2016-10-24 DIAGNOSIS — I712 Thoracic aortic aneurysm, without rupture: Secondary | ICD-10-CM | POA: Diagnosis not present

## 2016-10-24 DIAGNOSIS — Z79899 Other long term (current) drug therapy: Secondary | ICD-10-CM | POA: Diagnosis not present

## 2016-10-24 DIAGNOSIS — Z888 Allergy status to other drugs, medicaments and biological substances status: Secondary | ICD-10-CM | POA: Insufficient documentation

## 2016-10-24 DIAGNOSIS — R05 Cough: Secondary | ICD-10-CM | POA: Diagnosis not present

## 2016-10-24 NOTE — Progress Notes (Signed)
Thoracic Location of Tumor / Histology: stage IV (T3, N2, M1 A) poorly differentiated adenocarcinoma presented with large right upper lobe lung mass in addition to mediastinal lymphadenopathy and pleural based metastases in November 2017.  The patient has a history of of stage IA non-small cell lung cancer status post wedge resection of the right upper lobe with seed implants in November of 2008 and the patient has been observation since that time.    Patient presented late October to his PCP complaining of dyspnea. PCP sent patient to [facility] ED. ED workup discovered patient's heart beat was irregular and he had developed a right upper lobe lung mass.  Biopsies of right upper lobe lung mass by interventional radiology (if applicable) revealed:    Tobacco/Marijuana/Snuff/ETOH use: quit smoking in 1966  Past/Anticipated interventions by cardiothoracic surgery, if any: s/p wedge resection of RUL with seed implants under the care of Dr. Arlyce Dice 10/13/2007  Past/Anticipated interventions by medical oncology, if any: Observation since 2008,staging work up with MRI, referral for palliative radiotherapy to the obstructive RUL mass, albuterol for SOB  Signs/Symptoms  Weight changes, if any: no  Respiratory complaints, if any: mild sob  Hemoptysis, if any: no  Pain issues, if any:  Right sided chest discomfort that doesn't require him to take medication for relief.  SAFETY ISSUES:  Prior radiation? Seeds in 2008 with Dr. Arlyce Dice  Pacemaker/ICD?  no  Possible current pregnancy?no  Is the patient on methotrexate? no  Current Complaints / other details:  82 year old male. Also, suffers from lower extremity edema and hypertension but, advised by [facility] to follow up with PCP. Patient followed up with cardiologist and a fluid pill was prescribed. Bilateral lower extremity edema continues. MRI has not been scheduled by [facility] yet. Reports he is using his albuterol inhaler sporadically. Reports his  cough is mostly dry but, when productive sputum is clear to yellow. Denies pain or difficulty associated with swallowing. Weight is stable. Denies hot flashes.

## 2016-10-24 NOTE — Progress Notes (Signed)
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 Primary cancer of right upper lobe of lung (HCC) 162.3 C34.11    HISTORY OF PRESENT ILLNESS::[name] is a 82 y.o. male who has a history of wedge resection of the right upper lobe with seed implants on October 13, 2007. The patient has since been under observation by Dr. Arlyce Dice. Patient presented to his PCP in late October complaining of dyspnea. His PCP sent the patient to [facility] ED who discovered a right upper lobe lung mass.  He has a right upper lung mass with adenopathy and right pleural lesions.  A recent CT-guided core biopsy of the right upper lobe lung mass by interventional radiology revealed poorly differentiated adenocarcinoma.   Patient denies weight changes, rib pain, pain/diffiuclty swallowing or hot flashes. He is positive for mild shortness of breath, and right sided chest discomfort for which he does not require medication. Patient reports a mostly dry cough. Occasionally, he produces clear to yellow sputum.  PREVIOUS RADIATION THERAPY: Yes, Lung Brachytherapy Seeds with wedge resection in 2008  Past Medical History:  Diagnosis Date  . Adenocarcinoma of right lung, stage 4 ([place]) 10/13/2016  . Colon polyps   . Enlarged prostate   . Essential hypertension   . History of kidney stones   . Hypercholesteremia   . Lung cancer ([place]) Dx'd 01/2007   Stage IA non-small cell lung cancer, moderately differentiated adenocarcinoma - VATS and seed implants  . Shortness of breath at rest 10/13/2016  . Thoracic aortic aneurysm (HCC)    4.2 cm in AP diameter   . Type 2 diabetes mellitus  ([place])   :   Past Surgical History:  Procedure Laterality Date  . APPENDECTOMY    . CERVICAL SPINE SURGERY    . CHOLECYSTECTOMY    . COLONOSCOPY  04/17/2012   Procedure: COLONOSCOPY;  Surgeon: Jamesetta So, MD;  Location: [facility] ENDO SUITE;  Service: Gastroenterology;  Laterality: N/A;  . COLONOSCOPY W/ POLYPECTOMY    . TONSILLECTOMY    . wedge resection with seed implantation and node sampling with right VATS  03/13/2007  :   Current Outpatient Prescriptions:  .  albuterol (PROVENTIL HFA;VENTOLIN HFA) 108 (90 Base) MCG/ACT inhaler, Inhale 2 puffs into the lungs every 6 (six) hours as needed for wheezing or shortness of breath., Disp: 1 Inhaler, Rfl: 2 .  alfuzosin (UROXATRAL) 10 MG 24 hr tablet, Take 10 mg by mouth daily.  , Disp: , Rfl:  .  apixaban (ELIQUIS) 5 MG TABS tablet, Take 1 tablet (5 mg total) by mouth 2 (two) times daily., Disp: 60 tablet, Rfl: 1 .  folic acid (FOLVITE) 431 MCG tablet, Take 800 mcg by mouth daily., Disp: , Rfl:  .  furosemide (LASIX) 20 MG tablet, Take 40 mg (2 pills) in the am for 2 weeks, then reduce to 20 mg daily, Disp: 90 tablet, Rfl: 1 .  glipiZIDE (GLUCOTROL) 5 MG tablet, Take 5 mg by mouth 2 (two) times daily before a meal., Disp: , Rfl:  .  JANUMET 50-1000 MG tablet, 1 tablet 2 (two) times daily., Disp: , Rfl:  .  losartan (COZAAR) 100  MG tablet, Take 100 mg by mouth daily., Disp: , Rfl:  .  metoprolol (LOPRESSOR) 50 MG tablet, Take 1 tablet (50 mg total) by mouth 2 (two) times daily., Disp: 60 tablet, Rfl: 0 .  Omega-3 Fatty Acids (FISH OIL) 1000 MG CAPS, Take by mouth., Disp: , Rfl:  .  oxybutynin (DITROPAN-XL) 10 MG 24 hr tablet, Take 10 mg by mouth at bedtime., Disp: , Rfl:  .  potassium chloride (K-DUR) 10 MEQ tablet, Take 1 tablet (10 mEq total) by mouth daily., Disp: 90 tablet, Rfl: 3 .  simvastatin (ZOCOR) 40 MG tablet, Take 40 mg by mouth at bedtime.  , Disp: , Rfl:  .  HYDROcodone-acetaminophen (NORCO) 5-325 MG tablet, Take 1-2 tablets by  mouth every 6 (six) hours as needed for severe pain. (Patient not taking: Reported on 10/24/2016), Disp: 20 tablet, Rfl: 0:   Allergies  Allergen Reactions  . Sulfa Antibiotics Rash  :   Family History  Problem Relation Age of Onset  . Colon cancer Neg Hx   . Cancer Neg Hx   :   Social History   Social History  . Marital status: Married    Spouse name: N/A  . Number of children: N/A  . Years of education: N/A   Occupational History  . Not on file.   Social History Main Topics  . Smoking status: Former Smoker    Packs/day: 1.50    Years: 15.00    Types: Cigarettes    Quit date: 11/28/1964  . Smokeless tobacco: Never Used  . Alcohol use No  . Drug use: No  . Sexual activity: No   Other Topics Concern  . Not on file   Social History Narrative  . No narrative on file  :  REVIEW OF SYSTEMS:  A 15 point review of systems is documented in the electronic medical record. This was obtained by the nursing staff. However, I reviewed this with the patient to discuss relevant findings and make appropriate changes.  per med-onc Constitutional: positive for fatigue Eyes: negative Ears, nose, mouth, throat, and face: negative Respiratory: positive for cough, dyspnea on exertion, pleurisy/chest pain and sputum Cardiovascular: negative Gastrointestinal: negative Genitourinary:negative Integument/breast: negative Hematologic/lymphatic: negative Musculoskeletal:negative Neurological: negative Behavioral/Psych: negative Endocrine: negative Allergic/Immunologic: negative  I reviewed this with the patient today and concur.   PHYSICAL EXAM:  Blood pressure 114/71, pulse 84, temperature 97.9 F (36.6 C), temperature source Oral, resp. rate 18, height '5\' 9"'$  (1.753 m), weight 234 lb 6.4 oz (106.3 kg), SpO2 95 %. Per Dr. Julien Nordmann, General appearance: alert and fatigued Head: Normocephalic, without obvious abnormality, atraumatic Neck: no adenopathy Lymph nodes: Cervical,  supraclavicular, and axillary nodes normal. Resp: wheezes bilaterally Back: symmetric, no curvature. ROM normal. No CVA tenderness. Cardio: regular rate and rhythm, S1, S2 normal, no murmur, click, rub or gallop GI: soft, non-tender; bowel sounds normal; no masses,  no organomegaly Extremities: extremities normal, atraumatic, no cyanosis or edema Neurologic: Alert and oriented X 3, normal strength and tone. Normal symmetric reflexes. Normal coordination and gait I concur with these findings on my exam today  KPS = 80  100 - Normal; no complaints; no evidence of disease. 90   - Able to carry on normal activity; minor signs or symptoms of disease. 80   - Normal activity with effort; some signs or symptoms of disease. 43   - Cares for self; unable to carry on normal activity or to do active work. 60   - Requires occasional assistance,  but is able to care for most of his personal needs. 50   - Requires considerable assistance and frequent medical care. 55   - Disabled; requires special care and assistance. 36   - Severely disabled; hospital admission is indicated although death not imminent. 47   - Very sick; hospital admission necessary; active supportive treatment necessary. 10   - Moribund; fatal processes progressing rapidly. 0     - Dead  Karnofsky DA, Abelmann Carlyss, Craver LS and Burchenal JH [phone]) The use of the nitrogen mustards in the palliative treatment of carcinoma: with particular reference to bronchogenic carcinoma Cancer 1 634-56  LABORATORY DATA:  Lab Results  Component Value Date   WBC 10.6 10/18/2016   HGB 12.6 (L) 10/18/2016   HCT 39.4 10/18/2016   MCV 80.1 10/18/2016   PLT 330 10/18/2016   Lab Results  Component Value Date   NA 139 10/18/2016   K 4.1 10/18/2016   CL 101 10/18/2016   CO2 28 10/18/2016   Lab Results  Component Value Date   ALT 10 10/18/2016   AST 13 10/18/2016   ALKPHOS 48 10/18/2016   BILITOT 0.6 10/18/2016     RADIOGRAPHY: Dg Chest 2  View  Result Date: 09/30/2016 CLINICAL DATA:  Right-sided chest pain. EXAM: CHEST  2 VIEW COMPARISON:  PET-CT 09/21/2016 FINDINGS: Anterior right upper lobe mass with multiple radiation seeds along the periphery. Small right pleural effusion. No left pleural effusion. No other focal parenchymal opacity. No pneumothorax. Stable cardiomediastinal silhouette. No acute osseous abnormality. IMPRESSION: 1. Stable anterior right upper lobe mass. Electronically Signed   By: Kathreen Devoid   On: 09/30/2016 12:42   Ct Angio Chest Pe W And/or Wo Contrast  Result Date: 09/30/2016 CLINICAL DATA:  RIGHT chest pain and productive cough for 1 day. History of lung cancer 2008 VATS and brachytherapy, thoracic aortic aneurysm, hypertension and diabetes. EXAM: CT ANGIOGRAPHY CHEST WITH CONTRAST TECHNIQUE: Multidetector CT imaging of the chest was performed using the standard protocol during bolus administration of intravenous contrast. Multiplanar CT image reconstructions and MIPs were obtained to evaluate the vascular anatomy. CONTRAST:  50 cc Isovue 370 COMPARISON:  Chest radiograph September 30, 2016 at 1211 hours and CT chest August 25, 2016 FINDINGS: CARDIOVASCULAR: Adequate contrast opacification of the pulmonary artery's. Main pulmonary artery is not enlarged. No pulmonary arterial filling defects to the level of the subsegmental branches. Heart size is mildly enlarged, no right heart strain. Severe coronary artery calcifications. Enlarging pericardial effusion was 11 mm, now 22 mm. Thoracic aorta is 4.2 cm, stable. MEDIASTINUM/NODES: New 17 mm short access anterior mediastinal lymph node medial to the upper lobe mass. New 14 mm cardiophrenic angle lymph node. Additional greater than expected number of mediastinal lymph nodes measuring up to 12 mm. LUNGS/PLEURA: RIGHT upper lobe pleural based 8.9 x 4.2 cm mass was 8.2 x 3.8 cm. Brachytherapy seeds along the anterior superior aspect of the mass, status post RIGHT upper lobe  wedge resection. Similar RIGHT upper lobe centrilobular nodules deep to the mass. Increasing small to moderate RIGHT, stable small LEFT pleural effusion. Mild centrilobular emphysema. Stable 5 mm RIGHT upper lobe ground-glass nodule. Diffuse bronchial wall thickening, tracheal bronchial stable 10 mm nodule abutting the pleural surface LEFT lower lobe. No pneumothorax. UPPER ABDOMEN: Included view of the abdomen is nonacute. RIGHT upper pole renal cyst incompletely characterized. Abdominal suture material. No adrenal mass though incompletely imaged. MUSCULOSKELETAL: Visualized soft tissues and included osseous structures nonacute. Gynecomastia. Multilevel Schmorl's nodes. Review of  the MIP images confirms the above findings. IMPRESSION: No acute pulmonary embolism. Enlarging moderate pericardial effusion. Enlarging 8.9 x 4.2 cm RIGHT upper lobe mass, with new pathologic lymphadenopathy consistent with disease progression and new metastasis. Similar RIGHT upper lobe tree-in-bud infiltrates concerning for lymphangitic spread of tumor. Increasing small to moderate RIGHT and similar small LEFT pleural effusion concerning for malignant effusion. Stable 4.2 cm ascending aortic aneurysm. Recommend annual imaging followup by CTA or MRA. This recommendation follows 2010 ACCF/AHA/AATS/ACR/ASA/SCA/SCAI/SIR/STS/SVM Guidelines for the Diagnosis and Management of Patients with Thoracic Aortic Disease. Circulation. 2010; 121: N629-B284 Ascending thoracic aortic aneurysm. Electronically Signed   By: Elon Alas M.D.   On: 09/30/2016 14:48   Ct Biopsy  Result Date: 10/07/2016 INDICATION: PET positive recurrent left upper lobe anterior mass concerning for lung cancer recurrence. EXAM: CT-GUIDED BIOPSY ANTERIOR LEFT UPPER LOBE MASS BIOPSY MEDICATIONS: 1% LIDOCAINE LOCALLY ANESTHESIA/SEDATION: 0.5 mg IV Versed; 50 mcg IV Fentanyl Moderate Sedation Time:  10 MINUTES The patient was continuously monitored during the procedure  by the interventional radiology nurse under my direct supervision. PROCEDURE: The procedure, risks, benefits, and alternatives were explained to the patient. Questions regarding the procedure were encouraged and answered. The patient understands and consents to the procedure. previous imaging reviewed. patient positioned supine. noncontrast localization CT performed. the right upper lobe anterior mass was localized. overlying skin was marked. The RIGHT ANTERIOR CHEST was prepped with ChloraPrep in a sterile fashion, and a sterile drape was applied covering the operative field. A sterile gown and sterile gloves were used for the procedure. Under sterile conditions and local anesthesia, a 17 gauge 6.8 cm access needle was advanced from a anterior oblique approach into the right upper lobe mass. Needle position confirmed with CT. 2 18 gauge core biopsies obtained. Samples were intact and non fragmented. Needle removed. Patient tolerated the procedure well without complication. Vital sign monitoring by nursing staff during the procedure will continue as patient is in the special procedures unit for post procedure observation. FINDINGS: The images document guide needle placement within the anterior right upper lobe mass. Post biopsy images demonstrate complication or pneumothorax. Stable pleural effusions and pericardial effusion. COMPLICATIONS: None immediate. IMPRESSION: Successful CT-guided anterior right upper lobe mass 18 gauge core biopsies. Electronically Signed   By: Jerilynn Mages.  Shick M.D.   On: 10/07/2016 14:16      IMPRESSION: 82 y.o. gentleman with stage IV poorly differentiated adenocarcinoma of the right upper lung.    PLAN: Today, I talked to the patient and family about the findings and work-up thus far.  We discussed the natural history of advanced lung cancer and general treatment, highlighting the role of radiotherapy in the management.  We discussed the available radiation techniques, and focused on the  details of logistics and delivery.  We reviewed the anticipated acute and late sequelae associated with radiation in this setting.  The patient was encouraged to ask questions that I answered to the best of my ability.  I filled out a patient counseling form during our discussion including treatment diagrams.  We retained a copy for our records.  The patient would like to proceed with radiation and will be scheduled for CT simulation at 9 am on Wednesday 10/26/16.  I spent 30 minutes face to face with the patient and more than 50% of that time was spent in counseling and/or coordination of care.   ------------------------------------------------   Tyler Pita, MD [place] Director and Director of Stereotactic Radiosurgery Direct Dial: [phone]  Fax: [phone] [email or web address]  Skype  [email or web address]  This document serves as a record of services personally performed by Tyler Pita, MD. It was created on his behalf by Bethann Humble, a trained medical scribe. The creation of this record is based on the scribe's personal observations and the provider's statements to them. This document has been checked and approved by the attending provider.

## 2016-10-24 NOTE — Patient Instructions (Signed)
This is stage IV lung cancer of the right upper lung.  It has spread inside the chest to the point that we consider it incurable.  However, it is not life threatening at this time.  With radiation, we can slow it down.  We will do radiation planning on Wednesday at 9 am and then plan 10 treatments before Christmas.  In addition, I will request a visit with Wadie Lessen, a nurse practitioner, who can help coordinate 'goals of care' and 'advanced directives' so these are established now before health deteriorates.  That will be on a Monday morning and it would be good to bring Allena Katz for that visit.

## 2016-10-24 NOTE — Progress Notes (Signed)
See progress note under physician encounter. 

## 2016-10-25 ENCOUNTER — Telehealth: Payer: Self-pay | Admitting: Radiation Oncology

## 2016-10-25 ENCOUNTER — Other Ambulatory Visit: Payer: Self-pay | Admitting: Medical Oncology

## 2016-10-25 NOTE — Telephone Encounter (Signed)
Phoned patient's home. No answer. Phoned patient's cell. Confirmed SIM appointment for tomorrow. Arranged palliative care consult for Monday, December 4 at 0900 with Wadie Lessen. Patient plans to inform family friend, Allena Katz, of palliative care appointment so she can attend.

## 2016-10-25 NOTE — Telephone Encounter (Signed)
Returned message left by patient's niece, Erasmo Downer. Received verbal confirmation from patient that it is ok to speak with Erasmo Downer. Answered all questions to the best of my ability about stage, total dose of radiation to be given, potential side effects, expected results of treatment and why palliative care consult is necessary. Erasmo Downer verbalized understanding of all discussed and expressed appreciation for the return call. Cyril Mourning express that she resides in [place] but, she was very involved in the care provided to her aunt (the patient's wife) that died approximately one year ago in hospice care.

## 2016-10-25 NOTE — Telephone Encounter (Signed)
Returned son's call after receiving verbal confirmation from the patient I could discuss his care with his son, Arleigh Odowd. Discussed cancer stage, indication for radiation, expected outcomes and potential side effects specific to his father's case. Legrand Como requested more specific information about life expectancy with vs without treatment (in percentages, % of heathy tissue that would be affected and specifics about quality of life I couldn't specifically answer. Explained to Legrand Como I would pose these questions to Dr. Tammi Klippel or his PA, Bryson Ha and phone him back with their response. Legrand Como went onto explain that his grandson is fighting brain cancer and he has seen how cancer treatment has negatively affected his quality of life.

## 2016-10-26 ENCOUNTER — Telehealth: Payer: Self-pay | Admitting: *Deleted

## 2016-10-26 ENCOUNTER — Ambulatory Visit
Admission: RE | Admit: 2016-10-26 | Discharge: 2016-10-26 | Disposition: A | Discharge status: Home / Self Care | Payer: Medicare Other | Source: Ambulatory Visit | Attending: Radiation Oncology | Admitting: Radiation Oncology

## 2016-10-26 ENCOUNTER — Telehealth: Payer: Self-pay | Admitting: Radiation Oncology

## 2016-10-26 DIAGNOSIS — C7802 Secondary malignant neoplasm of left lung: Secondary | ICD-10-CM | POA: Diagnosis not present

## 2016-10-26 DIAGNOSIS — C7931 Secondary malignant neoplasm of brain: Secondary | ICD-10-CM | POA: Diagnosis not present

## 2016-10-26 DIAGNOSIS — C3411 Malignant neoplasm of upper lobe, right bronchus or lung: Secondary | ICD-10-CM | POA: Diagnosis not present

## 2016-10-26 DIAGNOSIS — Z510 Encounter for antineoplastic radiation therapy: Secondary | ICD-10-CM | POA: Diagnosis not present

## 2016-10-26 DIAGNOSIS — Z9889 Other specified postprocedural states: Secondary | ICD-10-CM | POA: Diagnosis not present

## 2016-10-26 DIAGNOSIS — R634 Abnormal weight loss: Secondary | ICD-10-CM | POA: Diagnosis not present

## 2016-10-26 NOTE — Progress Notes (Signed)
  Radiation Oncology         [phone] ________________________________  Name: [name] MRN: [id number]  Date: 10/26/2016  DOB: 1927/08/01  SIMULATION AND TREATMENT PLANNING NOTE    ICD-9-CM ICD-10-CM   1. Adenocarcinoma of right lung, stage 4 (HCC) 162.3 C34.11     DIAGNOSIS:  82 yo man with metastatic (T3, N2 M1a) poorly differentiated adenocarcinoma of the right upper lung, stage IV in an area previously irradiated with brachytherapy.  NARRATIVE:  The patient was brought to the [facility].  Identity was confirmed.  All relevant records and images related to the planned course of therapy were reviewed.  The patient freely provided informed written consent to proceed with treatment after reviewing the details related to the planned course of therapy. The consent form was witnessed and verified by the simulation staff.  Then, the patient was set-up in a stable reproducible  supine position for radiation therapy.  CT images were obtained.  Surface markings were placed.  The CT images were loaded into the planning software.  Then the target and avoidance structures were contoured.  Treatment planning then occurred.  The radiation prescription was entered and confirmed.  Then, I designed and supervised the construction of a total of 6 medically necessary complex treatment devices, including a BodyFix immobilization mold custom fitted to the patient along with 5 multileaf collimators conformally shaped radiation around the treatment target while shielding critical structures such as the heart and spinal cord maximally.  I have requested : 3D Simulation  I have requested a DVH of the following structures: Left lung, right lung, spinal cord, heart, esophagus, and target.  I have ordered:Nutrition Consult  SPECIAL TREATMENT PROCEDURE:  The planned course of therapy using radiation constitutes a special treatment procedure. Special care is required in the management of this  patient for the following reasons. This treatment constitutes a Special Treatment Procedure for the following reason: [ Retreatment in a previously radiated area requiring careful monitoring of increased risk of toxicity due to overlap of previous treatment..  The special nature of the planned course of radiotherapy will require increased physician supervision and oversight to ensure patient's safety with optimal treatment outcomes.  PLAN:  The patient will receive 30 Gy in 10 fractions.  ________________________________  Sheral Apley Tammi Klippel, M.D.  This document serves as a record of services personally performed by Tyler Pita, MD. It was created on his behalf by Bethann Humble, a trained medical scribe. The creation of this record is based on the scribe's personal observations and the provider's statements to them. This document has been checked and approved by the attending provider.

## 2016-10-26 NOTE — Telephone Encounter (Signed)
Returned message left by patient. No complaints at this time. Answered all questions to the best of my ability. Patient verbalized understanding.

## 2016-10-26 NOTE — Telephone Encounter (Signed)
Returning call about MRI appt. No answer

## 2016-10-26 NOTE — Progress Notes (Signed)
Spoke with Norton Blizzard, RN and lung navigator, reference his palliative xrt and challenging family dynamics. Hinton Dyer committed to reaching out to the family and being a point person.

## 2016-10-27 ENCOUNTER — Telehealth: Payer: Self-pay | Admitting: *Deleted

## 2016-10-27 ENCOUNTER — Encounter: Payer: Self-pay | Admitting: *Deleted

## 2016-10-27 NOTE — Telephone Encounter (Signed)
Oncology Nurse Navigator Documentation  Oncology Nurse Navigator Flowsheets 10/27/2016  Navigator Location [facility]  Navigator Encounter Type Telephone/I received a message from Dr. Johny Shears nurse that many of Mr. [name] family members are calling about an update.  I called patient and asked him if I could speak to the son.  He stated yes it is ok to speak to son about his care and next steps. Patient asked me about his MRI Brain and when that will be scheduled. I notified per cert coordinator to see if scan has been approved yet.  I then called Mr. [name] son and left vm message for him to call me.   Telephone Outgoing Call  Treatment Phase Treatment  Barriers/Navigation Needs Education;Family concerns  Education Other  Interventions Education;Other  Acuity Level 2  Acuity Level 2 Other  Time Spent with Patient 30

## 2016-10-27 NOTE — Progress Notes (Signed)
Cardiology Office Note  Date: 10/28/2016   ID: [name], DOB [birth year], MRN [id number]  PCP: Purvis Kilts, MD  Primary Cardiologist: Rozann Lesches, MD   Chief Complaint  Patient presents with  . Atrial Fibrillation  . Coronary Artery Disease    History of Present Illness: [name] is an 82 y.o. male seen recently in October. He presents for a follow-up visit, was started on Lasix and potassium supplements at the last visit. He had some improvement on the higher dose Lasix, but when he went back to 20 mg daily his leg edema has persisted. It looks like he has a very busy schedule for radiation treatments and palliative care discussion in December. He is not reporting any chest pain or palpitations, does have chronic shortness of breath.  Recent follow-up lab work is outlined below. Renal function and potassium have been stable.  I reviewed his medications. Cardiac regimen includes Eliquis, Lasix, potassium supplements, Lopressor, and Cozaar.  Past Medical History:  Diagnosis Date  . Adenocarcinoma of right lung, stage 4 ([place]) 10/13/2016  . Colon polyps   . Enlarged prostate   . Essential hypertension   . History of kidney stones   . Hypercholesteremia   . Lung cancer ([place]) Dx'd 01/2007   Stage IA non-small cell lung cancer, moderately differentiated adenocarcinoma - VATS and seed implants  . Shortness of breath at rest 10/13/2016  . Thoracic aortic aneurysm ([facility])    4.2 cm in AP diameter   . Type 2 diabetes mellitus ([place])     Past Surgical History:  Procedure Laterality Date  . APPENDECTOMY    . CERVICAL SPINE SURGERY    . CHOLECYSTECTOMY    . COLONOSCOPY  04/17/2012   Procedure: COLONOSCOPY;  Surgeon: Jamesetta So, MD;  Location: [facility];  Service: Gastroenterology;  Laterality: N/A;  . COLONOSCOPY W/ POLYPECTOMY    . TONSILLECTOMY    . wedge resection with seed implantation and node sampling with right VATS  03/13/2007    Current  Outpatient Prescriptions  Medication Sig Dispense Refill  . albuterol (PROVENTIL HFA;VENTOLIN HFA) 108 (90 Base) MCG/ACT inhaler Inhale 2 puffs into the lungs every 6 (six) hours as needed for wheezing or shortness of breath. 1 Inhaler 2  . alfuzosin (UROXATRAL) 10 MG 24 hr tablet Take 10 mg by mouth daily.      Marland Kitchen apixaban (ELIQUIS) 5 MG TABS tablet Take 1 tablet (5 mg total) by mouth 2 (two) times daily. 60 tablet 1  . folic acid (FOLVITE) 449 MCG tablet Take 800 mcg by mouth daily.    Marland Kitchen glipiZIDE (GLUCOTROL) 5 MG tablet Take 5 mg by mouth 2 (two) times daily before a meal.    . JANUMET 50-1000 MG tablet 1 tablet 2 (two) times daily.    Marland Kitchen losartan (COZAAR) 100 MG tablet Take 100 mg by mouth daily.    . metoprolol (LOPRESSOR) 50 MG tablet Take 1 tablet (50 mg total) by mouth 2 (two) times daily. 60 tablet 0  . Omega-3 Fatty Acids (FISH OIL) 1000 MG CAPS Take by mouth.    . oxybutynin (DITROPAN-XL) 10 MG 24 hr tablet Take 10 mg by mouth at bedtime.    . potassium chloride (K-DUR) 10 MEQ tablet Take 1 tablet (10 mEq total) by mouth daily. 90 tablet 3  . simvastatin (ZOCOR) 40 MG tablet Take 40 mg by mouth at bedtime.      . furosemide (LASIX) 40 MG tablet Take 1  tablet (40 mg total) by mouth daily. 90 tablet 3   No current facility-administered medications for this visit.    Allergies:  Sulfa antibiotics   Social History: The patient  reports that he quit smoking about 51 years ago. His smoking use included Cigarettes. He has a 22.50 pack-year smoking history. He has never used smokeless tobacco. He reports that he does not drink alcohol or use drugs.   Family History: The patient's family history is not on file.   ROS:  Please see the history of present illness. Otherwise, complete review of systems is positive for fatigue.  All other systems are reviewed and negative.   Physical Exam: VS:  BP 124/60   Pulse 79   Ht '5\' 9"'$  (1.753 m)   Wt 232 lb (105.2 kg)   SpO2 97%   BMI 34.26 kg/m ,  BMI Body mass index is 34.26 kg/m.  Wt Readings from Last 3 Encounters:  10/28/16 232 lb (105.2 kg)  10/24/16 234 lb 6.4 oz (106.3 kg)  10/13/16 237 lb 9.6 oz (107.8 kg)    General: Obese elderly male, appears comfortable at rest. HEENT: Conjunctiva and lids normal, oropharynx clear. Neck: Supple, no elevated JVP or carotid bruits, no thyromegaly. Lungs: Clear to auscultation, nonlabored breathing at rest. Cardiac: Irregularly irregular, no S3, soft systolic murmur, no pericardial rub. Abdomen: Protuberant, nontender, bowel sounds present, no guarding or rebound. Extremities: 2-3+ bilateral lower leg edema, distal pulses 2+. Skin: Warm and dry. Musculoskeletal: No kyphosis. Neuropsychiatric: Alert and oriented x3, affect grossly appropriate.  ECG: I personally reviewed the tracing from 09/30/2016 which showed atrial fibrillation with low voltage and nonspecific ST changes.   Recent Labwork: 08/25/2016: TSH 1.225 09/30/2016: B Natriuretic Peptide 72.0 10/18/2016: ALT 10; AST 13; BUN 22; Creat 0.90; Hemoglobin 12.6; Platelets 330; Potassium 4.1; Sodium 139   Other Studies Reviewed Today:  Echocardiogram 08/26/2016: Study Conclusions  - Left ventricle: The cavity size was normal. Wall thickness was   increased in a pattern of mild LVH. Systolic function was mildly   reduced. The estimated ejection fraction was in the range of 45%   to 50%. Although no diagnostic regional wall motion abnormality   was identified, this possibility cannot be completely excluded on   the basis of this study. The study is not technically sufficient   to allow evaluation of LV diastolic function. - Aortic valve: Moderately calcified annulus. Probably trileaflet;   mildly calcified leaflets. There was mild stenosis. There was   trivial regurgitation. Mean gradient (S): 8 mm Hg. Peak gradient   (S): 15 mm Hg. VTI ratio of LVOT to aortic valve: 0.46. Valve   area (VTI): 1.46 cm^2. Valve area (Vmax): 1.46  cm^2. Valve area   (Vmean): 1.64 cm^2. - Aortic root: The aortic root was mildly dilated. - Mitral valve: Severely calcified annulus. There was trivial   regurgitation. - Left atrium: The atrium was mildly dilated. - Right atrium: The atrium was mildly dilated. Central venous   pressure (est): 8 mm Hg. - Tricuspid valve: There was mild regurgitation. - Pulmonary arteries: PA peak pressure: 39 mm Hg (S). - Pericardium, extracardiac: A small pericardial effusion was   identified posterior to the heart.  Impressions:  - Mild LVH with LVEF approximately 45-50% in the setting of atrial   fibrillation. Indeterminate diastolic function. Mild left atrial   enlargement. Severe MAC with trivial mitral regurgitation. Mild   calcific aortic stenosis with trivial aortic regurgitation.   Mildly dilated aortic root. Mild  tricuspid regurgitation with   PASP 39 mmHg. Small posterior pericardial effusion.  Assessment and Plan:  1. Atrial fibrillation, heart rate controlled on current dose of Lopressor and tolerating Eliquis so far. CHADSVASC score is 5.  2. Persistent leg edema. Increase Lasix to 40 mg daily on a standing basis with potassium supplement. Recent renal function and potassium normal range.  3. Multivessel distribution coronary artery calcification by chest CT imaging. Plan continue conservative management with medical therapy. He is not reporting any angina.  4. Thoracic aortic aneurysm of 4.2 cm. He is asymptomatic.  Current medicines were reviewed with the patient today.  Disposition: Follow-up in 2 months.  Signed, Satira Sark, MD, [facility] 10/28/2016 9:54 AM    [facility]. [address] [place], [place] [address] Phone: [phone]; Fax: [phone]

## 2016-10-27 NOTE — Progress Notes (Signed)
Mr. [name] son called me back today.  He states he wanted to know how his dad was going to do with radiation.  I listened as he was concerned about his dad's care.  I explained next steps and the reason for the treatment. He was thankful for the call and will get more information from his dad at Dr. Worthy Flank appt regarding chemo.

## 2016-10-28 ENCOUNTER — Encounter (HOSPITAL_COMMUNITY): Payer: Self-pay

## 2016-10-28 ENCOUNTER — Encounter: Payer: Self-pay | Admitting: Cardiology

## 2016-10-28 ENCOUNTER — Telehealth: Payer: Self-pay | Admitting: *Deleted

## 2016-10-28 ENCOUNTER — Ambulatory Visit (INDEPENDENT_AMBULATORY_CARE_PROVIDER_SITE_OTHER): Payer: Medicare Other | Admitting: Cardiology

## 2016-10-28 ENCOUNTER — Ambulatory Visit: Payer: Medicare Other | Admitting: Cardiology

## 2016-10-28 VITALS — BP 124/60 | HR 79 | Ht 69.0 in | Wt 232.0 lb

## 2016-10-28 DIAGNOSIS — I4819 Other persistent atrial fibrillation: Secondary | ICD-10-CM

## 2016-10-28 DIAGNOSIS — Z9889 Other specified postprocedural states: Secondary | ICD-10-CM | POA: Diagnosis not present

## 2016-10-28 DIAGNOSIS — Z510 Encounter for antineoplastic radiation therapy: Secondary | ICD-10-CM | POA: Diagnosis not present

## 2016-10-28 DIAGNOSIS — C7931 Secondary malignant neoplasm of brain: Secondary | ICD-10-CM | POA: Diagnosis not present

## 2016-10-28 DIAGNOSIS — C3411 Malignant neoplasm of upper lobe, right bronchus or lung: Secondary | ICD-10-CM | POA: Diagnosis not present

## 2016-10-28 DIAGNOSIS — R634 Abnormal weight loss: Secondary | ICD-10-CM | POA: Diagnosis not present

## 2016-10-28 DIAGNOSIS — I712 Thoracic aortic aneurysm, without rupture, unspecified: Secondary | ICD-10-CM

## 2016-10-28 DIAGNOSIS — R600 Localized edema: Secondary | ICD-10-CM

## 2016-10-28 DIAGNOSIS — C7802 Secondary malignant neoplasm of left lung: Secondary | ICD-10-CM | POA: Diagnosis not present

## 2016-10-28 DIAGNOSIS — I25810 Atherosclerosis of coronary artery bypass graft(s) without angina pectoris: Secondary | ICD-10-CM

## 2016-10-28 DIAGNOSIS — I481 Persistent atrial fibrillation: Secondary | ICD-10-CM

## 2016-10-28 MED ORDER — FUROSEMIDE 40 MG PO TABS: 40.0000 mg | ORAL_TABLET | Freq: Every day | ORAL | 3 refills | Status: DC

## 2016-10-28 NOTE — Telephone Encounter (Signed)
Oncology Nurse Navigator Documentation  Oncology Nurse Navigator Flowsheets 10/28/2016  Navigator Location [facility]  Navigator Encounter Type Telephone/I called central scheduling for an appt for [name]'s MRI Brain.   I was able to get his scan at [facility] closer to his home.  I called [name] and gave him the appt on 10/31/16 arrive at [facility] at 3:45.  He verbalized understanding of appt time and place.   Telephone Outgoing Call  Treatment Phase Treatment  Barriers/Navigation Needs Coordination of Care  Interventions Coordination of Care  Coordination of Care Appts  Acuity Level 2  Acuity Level 2 Assistance expediting appointments  Time Spent with Patient 30

## 2016-10-28 NOTE — Patient Instructions (Signed)
Your physician recommends that you schedule a follow-up appointment in: 2 months    INCREASE Lasix to 40 mg daily      Thank you for choosing [facility] !

## 2016-10-31 ENCOUNTER — Ambulatory Visit (HOSPITAL_COMMUNITY)
Admission: RE | Admit: 2016-10-31 | Discharge: 2016-10-31 | Disposition: A | Discharge status: Home / Self Care | Payer: Medicare Other | Source: Ambulatory Visit | Attending: Internal Medicine | Admitting: Internal Medicine

## 2016-10-31 ENCOUNTER — Ambulatory Visit (HOSPITAL_BASED_OUTPATIENT_CLINIC_OR_DEPARTMENT_OTHER)
Admission: RE | Admit: 2016-10-31 | Discharge: 2016-10-31 | Disposition: A | Discharge status: Home / Self Care | Payer: Medicare Other | Source: Ambulatory Visit | Attending: Radiation Oncology | Admitting: Radiation Oncology

## 2016-10-31 DIAGNOSIS — G939 Disorder of brain, unspecified: Secondary | ICD-10-CM

## 2016-10-31 DIAGNOSIS — C7802 Secondary malignant neoplasm of left lung: Secondary | ICD-10-CM | POA: Diagnosis not present

## 2016-10-31 DIAGNOSIS — Z510 Encounter for antineoplastic radiation therapy: Secondary | ICD-10-CM | POA: Diagnosis not present

## 2016-10-31 DIAGNOSIS — R634 Abnormal weight loss: Secondary | ICD-10-CM | POA: Diagnosis not present

## 2016-10-31 DIAGNOSIS — Z515 Encounter for palliative care: Secondary | ICD-10-CM

## 2016-10-31 DIAGNOSIS — Z66 Do not resuscitate: Secondary | ICD-10-CM

## 2016-10-31 DIAGNOSIS — C7931 Secondary malignant neoplasm of brain: Secondary | ICD-10-CM | POA: Diagnosis not present

## 2016-10-31 DIAGNOSIS — C3491 Malignant neoplasm of unspecified part of right bronchus or lung: Secondary | ICD-10-CM

## 2016-10-31 DIAGNOSIS — Z9889 Other specified postprocedural states: Secondary | ICD-10-CM | POA: Diagnosis not present

## 2016-10-31 DIAGNOSIS — R930 Abnormal findings on diagnostic imaging of skull and head, not elsewhere classified: Secondary | ICD-10-CM | POA: Diagnosis not present

## 2016-10-31 DIAGNOSIS — C3411 Malignant neoplasm of upper lobe, right bronchus or lung: Secondary | ICD-10-CM | POA: Diagnosis not present

## 2016-10-31 MED ORDER — GADOBENATE DIMEGLUMINE 529 MG/ML IV SOLN
20.0000 mL | Freq: Once | INTRAVENOUS | Status: AC | PRN
  Administered 2016-10-31: 20 mL via INTRAVENOUS

## 2016-10-31 NOTE — Consult Note (Signed)
Consultation Note Date: 10/31/2016   Patient Name: [name]  DOB: 07/22/1927  MRN: [id number]  Age / Sex: 82 y.o., male  PCP: Sharilyn Sites, MD Referring Physician: Tyler Pita, MD  Reason for Consultation: Establishing goals of care and Psychosocial/spiritual support  HPI/Patient Profile: 82 y.o. male  with past medical history significant for Metastatic (T3, N2, M1a) poorly differentiated adenocarcinoma diagnosed in November 2017. The patient was initially diagnosed as Stage IA non-small cell lung cancer, moderately differentiated adenocarcinoma, diagnosed in April 2008. Dr Earlie Server is currently his oncologist  PRIOR THERAPY: Status post wedge resection of the right upper lobe with seed implants under the care of Dr Arlyce Dice on October 13, 2007.    The patient reports fatigue , weight loss and chest pain over the past several months.   He recently underwent CT-guided core biopsy of the right upper lobe lung mass by interventional radiology and the final pathology was consistent with poorly differentiated adenocarcinoma.   He is currently set up for radiation to start on Wednesday, 10 fractions.  He is faced with advanced care decisions and anticipatory care needs.  Clinical Assessment and Goals of Care:  This NP Wadie Lessen reviewed medical records, received report from team, assessed the patient and then meet with [name] and his support person [name] # [phone] in the outpatient radiation-oncology clinic.  Concept of  Palliative Care was discussed  A  discussion was had today regarding advanced directives.  Concepts specific to code status was had.  [name] tells me he has HPOA/daughter/ [name] documented and DNR docuemnted  Values and goals of care important to patient and family were attempted to be elicited.  [name] speaks clearly to his desire for  continued quality of life.  He is open to all offered and available medical interventions to prolong quality of life  Concept of Hospice and Palliative Care were discussedThe difference between a aggressive medical intervention path  and a palliative comfort care path for this patient at this time was had.  Questions and concerns addressed. Patient is encouraged to call with questions or concerns.    SUMMARY OF RECOMMENDATIONS    Code Status/Advance Care Planning:  DNR   He has  HPOA and advanced directive documented.  Encouraged to bring in for scanning.  Patient is very realistic, at this time he is hoping that the radiation will keep "things at [place]" for a while.  However if the disease progresses both he and his family will want comfort to be the focus of care.  He will take this one step at t time.  MOST form introduced   Symptom Management:   Pain- Intermittent right chest pain rated at a 2/10.  Discussed utilization of  OTC Tylenol or Ibruprofen    Psycho-social/Spiritual:    Patient was able to openly share his recent loss of his wife only 16 months ago, he was tearful telling her story.    He has three children who live out of state but are  involved in his care.  He has many friends who "are family" locally who help him.  He continue to work for the [employer] in [place].   Prognosis:   Unable to determine     Primary Diagnoses: Present on Admission: **None**   I have reviewed the medical record, interviewed the patient and family, and examined the patient. The following aspects are pertinent.  Past Medical History:  Diagnosis Date  . Adenocarcinoma of right lung, stage 4 ([place]) 10/13/2016  . Colon polyps   . Enlarged prostate   . Essential hypertension   . History of kidney stones   . Hypercholesteremia   . Lung cancer ([place]) Dx'd 01/2007   Stage IA non-small cell lung cancer, moderately differentiated adenocarcinoma - VATS and seed implants  .  Shortness of breath at rest 10/13/2016  . Thoracic aortic aneurysm (HCC)    4.2 cm in AP diameter   . Type 2 diabetes mellitus ([place])    Social History   Social History  . Marital status: Married    Spouse name: N/A  . Number of children: N/A  . Years of education: N/A   Social History Main Topics  . Smoking status: Former Smoker    Packs/day: 1.50    Years: 15.00    Types: Cigarettes    Quit date: 11/28/1964  . Smokeless tobacco: Never Used  . Alcohol use No  . Drug use: No  . Sexual activity: No   Other Topics Concern  . Not on file   Social History Narrative  . No narrative on file   Family History  Problem Relation Age of Onset  . Colon cancer Neg Hx   . Cancer Neg Hx    Scheduled Meds: Continuous Infusions: PRN Meds:. Medications Prior to Admission:  Prior to Admission medications   Medication Sig Start Date End Date Taking? Authorizing Provider  albuterol (PROVENTIL HFA;VENTOLIN HFA) 108 (90 Base) MCG/ACT inhaler Inhale 2 puffs into the lungs every 6 (six) hours as needed for wheezing or shortness of breath. 10/13/16   Curt Bears, MD  alfuzosin (UROXATRAL) 10 MG 24 hr tablet Take 10 mg by mouth daily.      Historical Provider, MD  apixaban (ELIQUIS) 5 MG TABS tablet Take 1 tablet (5 mg total) by mouth 2 (two) times daily. 08/26/16   Samuella Cota, MD  folic acid (FOLVITE) 119 MCG tablet Take 800 mcg by mouth daily.    Historical Provider, MD  furosemide (LASIX) 40 MG tablet Take 1 tablet (40 mg total) by mouth daily. 10/28/16 01/26/17  Satira Sark, MD  glipiZIDE (GLUCOTROL) 5 MG tablet Take 5 mg by mouth 2 (two) times daily before a meal.    Historical Provider, MD  JANUMET 50-1000 MG tablet 1 tablet 2 (two) times daily. 07/21/16   Historical Provider, MD  losartan (COZAAR) 100 MG tablet Take 100 mg by mouth daily.    Historical Provider, MD  metoprolol (LOPRESSOR) 50 MG tablet Take 1 tablet (50 mg total) by mouth 2 (two) times daily. 08/26/16   Samuella Cota, MD  Omega-3 Fatty Acids (FISH OIL) 1000 MG CAPS Take by mouth.    Historical Provider, MD  oxybutynin (DITROPAN-XL) 10 MG 24 hr tablet Take 10 mg by mouth at bedtime.    Historical Provider, MD  potassium chloride (K-DUR) 10 MEQ tablet Take 1 tablet (10 mEq total) by mouth daily. 09/26/16 12/25/16  Satira Sark, MD  simvastatin (ZOCOR) 40 MG tablet Take 40  mg by mouth at bedtime.      Historical Provider, MD   Allergies  Allergen Reactions  . Sulfa Antibiotics Rash   Review of Systems  Constitutional: Positive for fatigue.  Respiratory: Positive for shortness of breath.   Cardiovascular: Positive for leg swelling.    Physical Exam  Constitutional: He is oriented to person, place, and time. He appears well-developed.  Neurological: He is alert and oriented to person, place, and time.  Skin: Skin is warm and dry.    Vital Signs: There were no vitals taken for this visit.         SpO2:   O2 Device:  O2 Flow Rate: .   IO: Intake/output summary: No intake or output data in the 24 hours ending 10/31/16 1104  LBM:   Baseline Weight:   Most recent weight:        ECOG PERFORMANCE STATUS* (Eastern Cooperative Oncology Group)  0 Fully active, able to continue with all pre-disease activities without restriction. Pt score  1 Restricted in physically strenuous activity but ambulatory and able to carry out work of a light or sedentary nature, e.g., light house work, office work. 1  2 Ambulatory and capable of all self-care but unable to carry out any work activities. Up and about more than 50% of waking hours.    3 Capable of only limited self-care. Confined to bed or chair more than 50% of waking hours.   4 Completely disabled. Cannot carry on any self-care. Totally confined to bed or chair.   5 Dead.    As published in Am. J. Clin. Oncol.: Eustace Pen, M.M., Colon Flattery., [place], D.C., [place], Sharen Hint., Drexel Iha, P.P.: Toxicity And Response Criteria Of  [facility]. [place] 6:754-492, 1982.  The ECOG Performance Status is in the public domain therefore available for public use. To duplicate the scale, please cite the reference above and credit the [facility] Group, Tyler Pita M.D., Group Chair    Time In: 0900 Time Out: 1015 Time Total: 75 min Greater than 50%  of this time was spent counseling and coordinating care related to the above assessment and plan.  Signed by: Wadie Lessen, NP   Please contact Palliative Medicine Team phone at [phone] for questions and concerns.  For individual provider: See Shea Evans

## 2016-11-02 ENCOUNTER — Ambulatory Visit
Admission: RE | Admit: 2016-11-02 | Discharge: 2016-11-02 | Disposition: A | Discharge status: Home / Self Care | Payer: Medicare Other | Source: Ambulatory Visit | Attending: Radiation Oncology | Admitting: Radiation Oncology

## 2016-11-02 DIAGNOSIS — C7802 Secondary malignant neoplasm of left lung: Secondary | ICD-10-CM | POA: Diagnosis not present

## 2016-11-02 DIAGNOSIS — Z9889 Other specified postprocedural states: Secondary | ICD-10-CM | POA: Diagnosis not present

## 2016-11-02 DIAGNOSIS — Z510 Encounter for antineoplastic radiation therapy: Secondary | ICD-10-CM | POA: Diagnosis not present

## 2016-11-02 DIAGNOSIS — Z515 Encounter for palliative care: Secondary | ICD-10-CM | POA: Insufficient documentation

## 2016-11-02 DIAGNOSIS — Z66 Do not resuscitate: Secondary | ICD-10-CM | POA: Insufficient documentation

## 2016-11-02 DIAGNOSIS — C7931 Secondary malignant neoplasm of brain: Secondary | ICD-10-CM | POA: Diagnosis not present

## 2016-11-02 DIAGNOSIS — R634 Abnormal weight loss: Secondary | ICD-10-CM | POA: Diagnosis not present

## 2016-11-02 DIAGNOSIS — C3491 Malignant neoplasm of unspecified part of right bronchus or lung: Secondary | ICD-10-CM | POA: Insufficient documentation

## 2016-11-02 DIAGNOSIS — C3411 Malignant neoplasm of upper lobe, right bronchus or lung: Secondary | ICD-10-CM | POA: Diagnosis not present

## 2016-11-03 ENCOUNTER — Telehealth: Payer: Self-pay | Admitting: Internal Medicine

## 2016-11-03 ENCOUNTER — Encounter: Payer: Self-pay | Admitting: Internal Medicine

## 2016-11-03 ENCOUNTER — Ambulatory Visit
Admission: RE | Admit: 2016-11-03 | Discharge: 2016-11-03 | Disposition: A | Discharge status: Home / Self Care | Payer: Medicare Other | Source: Ambulatory Visit | Attending: Radiation Oncology | Admitting: Radiation Oncology

## 2016-11-03 ENCOUNTER — Other Ambulatory Visit (HOSPITAL_BASED_OUTPATIENT_CLINIC_OR_DEPARTMENT_OTHER): Payer: Medicare Other

## 2016-11-03 ENCOUNTER — Ambulatory Visit: Payer: Medicare Other

## 2016-11-03 ENCOUNTER — Ambulatory Visit (HOSPITAL_BASED_OUTPATIENT_CLINIC_OR_DEPARTMENT_OTHER): Payer: Medicare Other | Admitting: Internal Medicine

## 2016-11-03 VITALS — BP 122/53 | HR 53 | Temp 98.5°F | Resp 18 | Ht 69.0 in | Wt 232.1 lb

## 2016-11-03 DIAGNOSIS — C3411 Malignant neoplasm of upper lobe, right bronchus or lung: Secondary | ICD-10-CM

## 2016-11-03 DIAGNOSIS — Z9889 Other specified postprocedural states: Secondary | ICD-10-CM | POA: Diagnosis not present

## 2016-11-03 DIAGNOSIS — C7931 Secondary malignant neoplasm of brain: Secondary | ICD-10-CM

## 2016-11-03 DIAGNOSIS — R634 Abnormal weight loss: Secondary | ICD-10-CM | POA: Diagnosis not present

## 2016-11-03 DIAGNOSIS — Z510 Encounter for antineoplastic radiation therapy: Secondary | ICD-10-CM | POA: Diagnosis not present

## 2016-11-03 DIAGNOSIS — C7802 Secondary malignant neoplasm of left lung: Secondary | ICD-10-CM | POA: Diagnosis not present

## 2016-11-03 DIAGNOSIS — C3491 Malignant neoplasm of unspecified part of right bronchus or lung: Secondary | ICD-10-CM

## 2016-11-03 HISTORY — DX: Secondary malignant neoplasm of brain: C79.31

## 2016-11-03 LAB — CBC WITH DIFFERENTIAL/PLATELET
BASO%: 0.8 % (ref 0.0–2.0)
Basophils Absolute: 0.1 10*3/uL (ref 0.0–0.1)
EOS%: 0.4 % (ref 0.0–7.0)
Eosinophils Absolute: 0 10*3/uL (ref 0.0–0.5)
HCT: 38.3 % — ABNORMAL LOW (ref 38.4–49.9)
HEMOGLOBIN: 12.1 g/dL — AB (ref 13.0–17.1)
LYMPH%: 17.8 % (ref 14.0–49.0)
MCH: 25 pg — ABNORMAL LOW (ref 27.2–33.4)
MCHC: 31.6 g/dL — ABNORMAL LOW (ref 32.0–36.0)
MCV: 78.9 fL — ABNORMAL LOW (ref 79.3–98.0)
MONO#: 0.8 10*3/uL (ref 0.1–0.9)
MONO%: 6.8 % (ref 0.0–14.0)
NEUT%: 74.2 % (ref 39.0–75.0)
NEUTROS ABS: 8.5 10*3/uL — AB (ref 1.5–6.5)
Platelets: 260 10*3/uL (ref 140–400)
RBC: 4.86 10*6/uL (ref 4.20–5.82)
RDW: 15.7 % — AB (ref 11.0–14.6)
WBC: 11.4 10*3/uL — AB (ref 4.0–10.3)
lymph#: 2 10*3/uL (ref 0.9–3.3)

## 2016-11-03 LAB — COMPREHENSIVE METABOLIC PANEL
ALBUMIN: 3.1 g/dL — AB (ref 3.5–5.0)
ALK PHOS: 62 U/L (ref 40–150)
ALT: 10 U/L (ref 0–55)
AST: 11 U/L (ref 5–34)
Anion Gap: 10 mEq/L (ref 3–11)
BILIRUBIN TOTAL: 0.63 mg/dL (ref 0.20–1.20)
BUN: 22.5 mg/dL (ref 7.0–26.0)
CO2: 27 mEq/L (ref 22–29)
Calcium: 10.4 mg/dL (ref 8.4–10.4)
Chloride: 101 mEq/L (ref 98–109)
Creatinine: 0.8 mg/dL (ref 0.7–1.3)
EGFR: 79 mL/min/{1.73_m2} — ABNORMAL LOW (ref >90)
GLUCOSE: 123 mg/dL (ref 70–140)
POTASSIUM: 4.2 meq/L (ref 3.5–5.1)
SODIUM: 138 meq/L (ref 136–145)
TOTAL PROTEIN: 6.7 g/dL (ref 6.4–8.3)

## 2016-11-03 NOTE — Progress Notes (Signed)
[place] Telephone:([phone]   Fax:([phone]) [phone]  OFFICE PROGRESS NOTE  Purvis Kilts, MD [address] [place] [address]  DIAGNOSIS: Stage IV (T3, N2, M1) poorly differentiated adenocarcinoma diagnosed in November 2017. He has a history of stage IA non-small cell lung cancer moderately differentiated adenocarcinoma diagnosed in April 2008.  PRIOR THERAPY: Status post wedge resection of the right upper lobe with seed implants under the care of Dr. Arlyce Dice on 10/13/2007.  CURRENT THERAPY: Palliative radiotherapy to the large right upper lobe lung mass under the care of Dr. Tammi Klippel  INTERVAL HISTORY: [name] 82 y.o. male returns to the clinic today for follow-up visit. The patient is feeling fine today with no complaints except for mild fatigue. He is currently undergoing palliative radiotherapy to the pleural based lung mass under the care of Dr. Tammi Klippel started yesterday. He is tolerating his treatment well. He denied having any chest pain but has shortness of breath with exertion with mild cough and no hemoptysis. He denied having any significant weight loss or night sweats. He has no nausea or vomiting. He had MRI of the brain performed recently and he is here for evaluation and discussion of his scan results and treatment options. Molecular studies are still pending but PDL 1 expression is 0%.  MEDICAL HISTORY: Past Medical History:  Diagnosis Date  . Adenocarcinoma of right lung, stage 4 ([place]) 10/13/2016  . Colon polyps   . Enlarged prostate   . Essential hypertension   . History of kidney stones   . Hypercholesteremia   . Lung cancer ([place]) Dx'd 01/2007   Stage IA non-small cell lung cancer, moderately differentiated adenocarcinoma - VATS and seed implants  . Shortness of breath at rest 10/13/2016  . Thoracic aortic aneurysm (HCC)    4.2 cm in AP diameter   . Type 2 diabetes mellitus (HCC)     ALLERGIES:  is allergic to sulfa  antibiotics.  MEDICATIONS:  Current Outpatient Prescriptions  Medication Sig Dispense Refill  . albuterol (PROVENTIL HFA;VENTOLIN HFA) 108 (90 Base) MCG/ACT inhaler Inhale 2 puffs into the lungs every 6 (six) hours as needed for wheezing or shortness of breath. 1 Inhaler 2  . alfuzosin (UROXATRAL) 10 MG 24 hr tablet Take 10 mg by mouth daily.      Marland Kitchen apixaban (ELIQUIS) 5 MG TABS tablet Take 1 tablet (5 mg total) by mouth 2 (two) times daily. 60 tablet 1  . folic acid (FOLVITE) 240 MCG tablet Take 800 mcg by mouth daily.    . furosemide (LASIX) 40 MG tablet Take 1 tablet (40 mg total) by mouth daily. 90 tablet 3  . glipiZIDE (GLUCOTROL) 5 MG tablet Take 5 mg by mouth 2 (two) times daily before a meal.    . JANUMET 50-1000 MG tablet 1 tablet 2 (two) times daily.    Marland Kitchen losartan (COZAAR) 100 MG tablet Take 100 mg by mouth daily.    . metoprolol (LOPRESSOR) 50 MG tablet Take 1 tablet (50 mg total) by mouth 2 (two) times daily. 60 tablet 0  . Omega-3 Fatty Acids (FISH OIL) 1000 MG CAPS Take by mouth.    . oxybutynin (DITROPAN-XL) 10 MG 24 hr tablet Take 10 mg by mouth at bedtime.    . potassium chloride (K-DUR) 10 MEQ tablet Take 1 tablet (10 mEq total) by mouth daily. 90 tablet 3  . simvastatin (ZOCOR) 40 MG tablet Take 40 mg by mouth at bedtime.  No current facility-administered medications for this visit.     SURGICAL HISTORY:  Past Surgical History:  Procedure Laterality Date  . APPENDECTOMY    . CERVICAL SPINE SURGERY    . CHOLECYSTECTOMY    . COLONOSCOPY  04/17/2012   Procedure: COLONOSCOPY;  Surgeon: Jamesetta So, MD;  Location: [facility];  Service: Gastroenterology;  Laterality: N/A;  . COLONOSCOPY W/ POLYPECTOMY    . TONSILLECTOMY    . wedge resection with seed implantation and node sampling with right VATS  03/13/2007    REVIEW OF SYSTEMS:  A comprehensive review of systems was negative except for: Constitutional: positive for fatigue Respiratory: positive for cough  and dyspnea on exertion   PHYSICAL EXAMINATION: General appearance: alert, cooperative, fatigued and no distress Head: Normocephalic, without obvious abnormality, atraumatic Neck: no adenopathy, no JVD, supple, symmetrical, trachea midline and thyroid not enlarged, symmetric, no tenderness/mass/nodules Lymph nodes: Cervical, supraclavicular, and axillary nodes normal. Resp: clear to auscultation bilaterally Back: symmetric, no curvature. ROM normal. No CVA tenderness. Cardio: regular rate and rhythm, S1, S2 normal, no murmur, click, rub or gallop GI: soft, non-tender; bowel sounds normal; no masses,  no organomegaly Extremities: extremities normal, atraumatic, no cyanosis or edema  ECOG PERFORMANCE STATUS: 1 - Symptomatic but completely ambulatory  Blood pressure (!) 122/53, pulse (!) 53, temperature 98.5 F (36.9 C), temperature source Oral, resp. rate 18, height '5\' 9"'$  (1.753 m), weight 232 lb 1.6 oz (105.3 kg), SpO2 98 %.  LABORATORY DATA: Lab Results  Component Value Date   WBC 11.4 (H) 11/03/2016   HGB 12.1 (L) 11/03/2016   HCT 38.3 (L) 11/03/2016   MCV 78.9 (L) 11/03/2016   PLT 260 11/03/2016      Chemistry      Component Value Date/Time   NA 138 11/03/2016 1050   K 4.2 11/03/2016 1050   CL 101 10/18/2016 1150   CL 104 10/30/2012 0805   CO2 27 11/03/2016 1050   BUN 22.5 11/03/2016 1050   CREATININE 0.8 11/03/2016 1050      Component Value Date/Time   CALCIUM 10.4 11/03/2016 1050   ALKPHOS 62 11/03/2016 1050   AST 11 11/03/2016 1050   ALT 10 11/03/2016 1050   BILITOT 0.63 11/03/2016 1050       RADIOGRAPHIC STUDIES: Mr Jeri Cos QB Contrast  Result Date: 10/31/2016 CLINICAL DATA:  82 year old male with lung cancer. Evaluate for possible intracranial metastatic disease. Initial encounter. EXAM: MRI HEAD WITHOUT AND WITH CONTRAST TECHNIQUE: Multiplanar, multiecho pulse sequences of the brain and surrounding structures were obtained without and with intravenous  contrast. CONTRAST:  74m MULTIHANCE GADOBENATE DIMEGLUMINE 529 MG/ML IV SOLN COMPARISON:  09/09/2015 head CT.  No comparison brain MR. FINDINGS: Brain: No acute infarct or intracranial hemorrhage. Posterior left thalamic 8 x 7 x 6 mm ring-like enhancing lesion which in the present clinical setting is suspicious for a solitary intracranial metastatic lesion. Mild chronic microvascular changes. Mild global atrophy without hydrocephalus. Vascular: Major intracranial vascular structures are patent. Skull and upper cervical spine: Mild transverse ligament hypertrophy. Sinuses/Orbits: Post lens replacement without acute orbital abnormality. Minimal mucosal thickening ethmoid sinus air cells. Other: Negative. IMPRESSION: Solitary 8 mm posterior left thalamic metastatic lesion suspected as detailed above. Electronically Signed   By: SGenia DelM.D.   On: 10/31/2016 17:21   Ct Biopsy  Result Date: 10/07/2016 INDICATION: PET positive recurrent left upper lobe anterior mass concerning for lung cancer recurrence. EXAM: CT-GUIDED BIOPSY ANTERIOR LEFT UPPER LOBE MASS BIOPSY MEDICATIONS: 1%  LIDOCAINE LOCALLY ANESTHESIA/SEDATION: 0.5 mg IV Versed; 50 mcg IV Fentanyl Moderate Sedation Time:  10 MINUTES The patient was continuously monitored during the procedure by the interventional radiology nurse under my direct supervision. PROCEDURE: The procedure, risks, benefits, and alternatives were explained to the patient. Questions regarding the procedure were encouraged and answered. The patient understands and consents to the procedure. previous imaging reviewed. patient positioned supine. noncontrast localization CT performed. the right upper lobe anterior mass was localized. overlying skin was marked. The RIGHT ANTERIOR CHEST was prepped with ChloraPrep in a sterile fashion, and a sterile drape was applied covering the operative field. A sterile gown and sterile gloves were used for the procedure. Under sterile conditions and  local anesthesia, a 17 gauge 6.8 cm access needle was advanced from a anterior oblique approach into the right upper lobe mass. Needle position confirmed with CT. 2 18 gauge core biopsies obtained. Samples were intact and non fragmented. Needle removed. Patient tolerated the procedure well without complication. Vital sign monitoring by nursing staff during the procedure will continue as patient is in the special procedures unit for post procedure observation. FINDINGS: The images document guide needle placement within the anterior right upper lobe mass. Post biopsy images demonstrate complication or pneumothorax. Stable pleural effusions and pericardial effusion. COMPLICATIONS: None immediate. IMPRESSION: Successful CT-guided anterior right upper lobe mass 18 gauge core biopsies. Electronically Signed   By: Jerilynn Mages.  Shick M.D.   On: 10/07/2016 14:16    ASSESSMENT AND PLAN: This is a very pleasant 82 years old white male recently diagnosed with a stage IV non-small cell lung cancer, poorly differentiated adenocarcinoma. He has a history of stage IA non-small cell lung cancer in February 2008 status post right upper lobe wedge resection with seed implants. The molecular studies are still pending. He is currently undergoing palliative radiotherapy to the right lung mass. MRI of the brain showed solitary brain metastasis. I will refer the patient to Dr. Tammi Klippel for consideration of SRS of his brain metastasis. I will see the patient back for follow-up visit in 2 weeks for reevaluation and discussion of his systemic treatment options in details after the molecular studies become available He was advised to call immediately if he has any concerning symptoms in the interval. The patient voices understanding of current disease status and treatment options and is in agreement with the current care plan.  All questions were answered. The patient knows to call the clinic with any problems, questions or concerns. We can  certainly see the patient much sooner if necessary.  I spent 10 minutes counseling the patient face to face. The total time spent in the appointment was 15 minutes.  Disclaimer: This note was dictated with voice recognition software. Similar sounding words can inadvertently be transcribed and may not be corrected upon review.

## 2016-11-03 NOTE — Telephone Encounter (Signed)
Appointments scheduled per 12/7 LOS. Patient given AVS report and calendars with futures scheduled appointments. Appointment for 12/21 double booked per MD request.

## 2016-11-04 ENCOUNTER — Encounter: Payer: Self-pay | Admitting: Radiation Oncology

## 2016-11-04 ENCOUNTER — Ambulatory Visit
Admission: RE | Admit: 2016-11-04 | Discharge: 2016-11-04 | Disposition: A | Discharge status: Home / Self Care | Payer: Medicare Other | Source: Ambulatory Visit | Attending: Radiation Oncology | Admitting: Radiation Oncology

## 2016-11-04 VITALS — BP 153/73 | HR 114 | Resp 18 | Wt 234.0 lb

## 2016-11-04 DIAGNOSIS — Z9889 Other specified postprocedural states: Secondary | ICD-10-CM | POA: Diagnosis not present

## 2016-11-04 DIAGNOSIS — Z923 Personal history of irradiation: Secondary | ICD-10-CM | POA: Insufficient documentation

## 2016-11-04 DIAGNOSIS — C7802 Secondary malignant neoplasm of left lung: Secondary | ICD-10-CM | POA: Diagnosis not present

## 2016-11-04 DIAGNOSIS — Z510 Encounter for antineoplastic radiation therapy: Secondary | ICD-10-CM | POA: Diagnosis not present

## 2016-11-04 DIAGNOSIS — C3411 Malignant neoplasm of upper lobe, right bronchus or lung: Secondary | ICD-10-CM | POA: Diagnosis not present

## 2016-11-04 DIAGNOSIS — R634 Abnormal weight loss: Secondary | ICD-10-CM | POA: Diagnosis not present

## 2016-11-04 DIAGNOSIS — C7931 Secondary malignant neoplasm of brain: Secondary | ICD-10-CM | POA: Diagnosis not present

## 2016-11-04 MED ORDER — RADIAPLEXRX EX GEL
Freq: Once | CUTANEOUS | Status: AC
  Administered 2016-11-04: 17:00:00 via TOPICAL

## 2016-11-04 NOTE — Progress Notes (Signed)
  Radiation Oncology         [phone]   Name: [name] MRN: [id number]   Date: 11/04/2016  DOB: 03/15/1927   Weekly Radiation Therapy Management    ICD-9-CM ICD-10-CM   1. Malignant neoplasm of upper lobe of right lung (HCC) 162.3 C34.11 hyaluronate sodium (RADIAPLEXRX) gel    Current Dose: 9 Gy  Planned Dose:  30 Gy  Narrative The patient presents for routine under treatment assessment.  Denies pain. Reports his cough is less frequent. Reports his cough is productive with clear sputum. Denies hemoptysis. Denies pain or difficulty associated with swallowing. Reports SOB seems improved. No skin changes noted within treatment filed. Learned patient recently had an MRI of the brain  He has an 8 mm left thalamic met on staging work-up  Set-up films were reviewed. The chart was checked.     weight is 234 lb (106.1 kg). His blood pressure is 153/73 (abnormal) and his pulse is 114 (abnormal). His respiration is 18 and oxygen saturation is 96%. . Weight essentially stable.  No significant changes.  Impression The patient is tolerating radiation.  Plan Continue treatment as planned.  Consider SRS to the thalamic met         Rodman Key A. Tammi Klippel, M.D.  This document serves as a record of services personally performed by Tyler Pita, MD. It was created on his behalf by Darcus Austin, a trained medical scribe. The creation of this record is based on the scribe's personal observations and the provider's statements to them. This document has been checked and approved by the attending provider.

## 2016-11-04 NOTE — Progress Notes (Signed)
Weight stable. BP stable. Denies pain. Reports his cough is less frequent. Reports his cough is productive with clear sputum. Denies hemoptysis. Denies pain or difficulty associated with swallowing. Reports SOB seems improved. No skin changes noted within treatment filed. Learned patient recently had an MRI of the brain.   BP (!) 153/73 (BP Location: Left Arm, Patient Position: Sitting, Cuff Size: Normal)   Pulse (!) 114   Resp 18   Wt 234 lb (106.1 kg)   SpO2 96%   BMI 34.56 kg/m  Wt Readings from Last 3 Encounters:  11/04/16 234 lb (106.1 kg)  11/03/16 232 lb 1.6 oz (105.3 kg)  10/28/16 232 lb (105.2 kg)

## 2016-11-07 ENCOUNTER — Other Ambulatory Visit: Payer: Self-pay | Admitting: *Deleted

## 2016-11-07 ENCOUNTER — Ambulatory Visit
Admission: RE | Admit: 2016-11-07 | Discharge: 2016-11-07 | Disposition: A | Discharge status: Home / Self Care | Payer: Medicare Other | Source: Ambulatory Visit | Attending: Radiation Oncology | Admitting: Radiation Oncology

## 2016-11-07 ENCOUNTER — Encounter (HOSPITAL_COMMUNITY): Payer: Self-pay

## 2016-11-07 DIAGNOSIS — C7949 Secondary malignant neoplasm of other parts of nervous system: Principal | ICD-10-CM

## 2016-11-07 DIAGNOSIS — C3411 Malignant neoplasm of upper lobe, right bronchus or lung: Secondary | ICD-10-CM | POA: Diagnosis not present

## 2016-11-07 DIAGNOSIS — R634 Abnormal weight loss: Secondary | ICD-10-CM | POA: Diagnosis not present

## 2016-11-07 DIAGNOSIS — Z510 Encounter for antineoplastic radiation therapy: Secondary | ICD-10-CM | POA: Diagnosis not present

## 2016-11-07 DIAGNOSIS — C7931 Secondary malignant neoplasm of brain: Secondary | ICD-10-CM | POA: Diagnosis not present

## 2016-11-07 DIAGNOSIS — Z9889 Other specified postprocedural states: Secondary | ICD-10-CM | POA: Diagnosis not present

## 2016-11-07 DIAGNOSIS — C7802 Secondary malignant neoplasm of left lung: Secondary | ICD-10-CM | POA: Diagnosis not present

## 2016-11-08 ENCOUNTER — Ambulatory Visit
Admission: RE | Admit: 2016-11-08 | Discharge: 2016-11-08 | Disposition: A | Discharge status: Home / Self Care | Payer: Medicare Other | Source: Ambulatory Visit | Attending: Radiation Oncology | Admitting: Radiation Oncology

## 2016-11-08 ENCOUNTER — Other Ambulatory Visit: Payer: Self-pay | Admitting: Radiation Oncology

## 2016-11-08 ENCOUNTER — Encounter (HOSPITAL_COMMUNITY): Payer: Self-pay

## 2016-11-08 DIAGNOSIS — C3411 Malignant neoplasm of upper lobe, right bronchus or lung: Secondary | ICD-10-CM | POA: Diagnosis not present

## 2016-11-08 DIAGNOSIS — R634 Abnormal weight loss: Secondary | ICD-10-CM | POA: Diagnosis not present

## 2016-11-08 DIAGNOSIS — C7931 Secondary malignant neoplasm of brain: Secondary | ICD-10-CM

## 2016-11-08 DIAGNOSIS — Z510 Encounter for antineoplastic radiation therapy: Secondary | ICD-10-CM | POA: Diagnosis not present

## 2016-11-08 DIAGNOSIS — C7802 Secondary malignant neoplasm of left lung: Secondary | ICD-10-CM | POA: Diagnosis not present

## 2016-11-08 DIAGNOSIS — Z9889 Other specified postprocedural states: Secondary | ICD-10-CM | POA: Diagnosis not present

## 2016-11-08 MED ORDER — LORAZEPAM 1 MG PO TABS: 0.5000 mg | ORAL_TABLET | Freq: Three times a day (TID) | ORAL | 0 refills | Status: DC | PRN

## 2016-11-09 ENCOUNTER — Ambulatory Visit
Admission: RE | Admit: 2016-11-09 | Discharge: 2016-11-09 | Disposition: A | Discharge status: Home / Self Care | Payer: Medicare Other | Source: Ambulatory Visit | Attending: Radiation Oncology | Admitting: Radiation Oncology

## 2016-11-09 DIAGNOSIS — Z510 Encounter for antineoplastic radiation therapy: Secondary | ICD-10-CM | POA: Diagnosis not present

## 2016-11-09 DIAGNOSIS — Z9889 Other specified postprocedural states: Secondary | ICD-10-CM | POA: Diagnosis not present

## 2016-11-09 DIAGNOSIS — C3411 Malignant neoplasm of upper lobe, right bronchus or lung: Secondary | ICD-10-CM | POA: Diagnosis not present

## 2016-11-09 DIAGNOSIS — R634 Abnormal weight loss: Secondary | ICD-10-CM | POA: Diagnosis not present

## 2016-11-09 DIAGNOSIS — C7802 Secondary malignant neoplasm of left lung: Secondary | ICD-10-CM | POA: Diagnosis not present

## 2016-11-09 DIAGNOSIS — C7931 Secondary malignant neoplasm of brain: Secondary | ICD-10-CM | POA: Diagnosis not present

## 2016-11-10 ENCOUNTER — Ambulatory Visit
Admission: RE | Admit: 2016-11-10 | Discharge: 2016-11-10 | Disposition: A | Discharge status: Home / Self Care | Payer: Medicare Other | Source: Ambulatory Visit | Attending: Radiation Oncology | Admitting: Radiation Oncology

## 2016-11-10 DIAGNOSIS — Z510 Encounter for antineoplastic radiation therapy: Secondary | ICD-10-CM | POA: Diagnosis not present

## 2016-11-10 DIAGNOSIS — Z9889 Other specified postprocedural states: Secondary | ICD-10-CM | POA: Diagnosis not present

## 2016-11-10 DIAGNOSIS — C3411 Malignant neoplasm of upper lobe, right bronchus or lung: Secondary | ICD-10-CM | POA: Diagnosis not present

## 2016-11-10 DIAGNOSIS — C7931 Secondary malignant neoplasm of brain: Secondary | ICD-10-CM | POA: Diagnosis not present

## 2016-11-10 DIAGNOSIS — R634 Abnormal weight loss: Secondary | ICD-10-CM | POA: Diagnosis not present

## 2016-11-10 DIAGNOSIS — C7802 Secondary malignant neoplasm of left lung: Secondary | ICD-10-CM | POA: Diagnosis not present

## 2016-11-10 NOTE — Progress Notes (Signed)
  Radiation Oncology         [phone] ________________________________  Name: [name] MRN: [id number]  Date: 11/11/2016  DOB: 1927-06-17  SIMULATION AND TREATMENT PLANNING NOTE    ICD-9-CM ICD-10-CM   1. Brain metastasis ([place]) 198.3 C79.31     DIAGNOSIS:  82 yo man with an 8 mm solitary left thalamic metastasis  NARRATIVE:  The patient was brought to the [facility].  Identity was confirmed.  All relevant records and images related to the planned course of therapy were reviewed.  The patient freely provided informed written consent to proceed with treatment after reviewing the details related to the planned course of therapy. The consent form was witnessed and verified by the simulation staff. Intravenous access was established for contrast administration. Then, the patient was set-up in a stable reproducible supine position for radiation therapy.  A relocatable thermoplastic stereotactic head frame was fabricated for precise immobilization.  CT images were obtained.  Surface markings were placed.  The CT images were loaded into the planning software will be fused with the patient's targeting MRI scan.  Then the target and avoidance structures will be contoured.  Treatment planning will occurred.  The radiation prescription was entered and confirmed.  I have requested 3D planning  I have requested a DVH of the following structures: Brain stem, brain, left eye, right eye, lenses, optic chiasm, target volumes, uninvolved brain, and normal tissue.    SPECIAL TREATMENT PROCEDURE:  The planned course of therapy using radiation constitutes a special treatment procedure. Special care is required in the management of this patient for the following reasons. This treatment constitutes a Special Treatment Procedure for the following reason: High dose per fraction requiring special monitoring for increased toxicities of treatment including daily imaging.  The special nature of the  planned course of radiotherapy will require increased physician supervision and oversight to ensure patient's safety with optimal treatment outcomes.  PLAN:  The patient will receive 20 Gy in 1 fraction.  ________________________________  Sheral Apley Tammi Klippel, M.D.

## 2016-11-11 ENCOUNTER — Ambulatory Visit
Admission: RE | Admit: 2016-11-11 | Discharge: 2016-11-11 | Disposition: A | Discharge status: Home / Self Care | Payer: Medicare Other | Source: Ambulatory Visit | Attending: Radiation Oncology | Admitting: Radiation Oncology

## 2016-11-11 VITALS — BP 130/81 | HR 80 | Temp 97.7°F | Resp 20

## 2016-11-11 DIAGNOSIS — C7931 Secondary malignant neoplasm of brain: Secondary | ICD-10-CM | POA: Diagnosis not present

## 2016-11-11 DIAGNOSIS — Z510 Encounter for antineoplastic radiation therapy: Secondary | ICD-10-CM | POA: Diagnosis not present

## 2016-11-11 DIAGNOSIS — R634 Abnormal weight loss: Secondary | ICD-10-CM | POA: Diagnosis not present

## 2016-11-11 DIAGNOSIS — C3411 Malignant neoplasm of upper lobe, right bronchus or lung: Secondary | ICD-10-CM | POA: Diagnosis not present

## 2016-11-11 DIAGNOSIS — C7802 Secondary malignant neoplasm of left lung: Secondary | ICD-10-CM | POA: Diagnosis not present

## 2016-11-11 DIAGNOSIS — Z9889 Other specified postprocedural states: Secondary | ICD-10-CM | POA: Diagnosis not present

## 2016-11-11 MED ORDER — LORAZEPAM 1 MG PO TABS: 1.0000 mg | ORAL_TABLET | Freq: Three times a day (TID) | ORAL | 0 refills | Status: DC

## 2016-11-11 MED ORDER — SODIUM CHLORIDE 0.9% FLUSH
10.0000 mL | Freq: Once | INTRAVENOUS | Status: AC
  Administered 2016-11-11: 10 mL via INTRAVENOUS

## 2016-11-11 NOTE — Progress Notes (Signed)
Does patient have an allergy to IV contrast dye?:  NO   Has patient ever received premedication for IV contrast dye?: NO  Does patient take metformin?: No, janumet and glucotrol  If patient does take metformin when was the last dose:  Not taking metformin  Date of lab work: 11/03/2016 BUN: 22.5 CR: 0.8  IV site: RAC  22 g catheter needle x 1 attempt, excellent blood return,flushed with 43m normal saline  BP 130/81 (BP Location: Right Arm, Patient Position: Sitting, Cuff Size: Normal)   Pulse 80   Temp 97.7 F (36.5 C) (Oral)   Resp 20

## 2016-11-14 ENCOUNTER — Ambulatory Visit
Admission: RE | Admit: 2016-11-14 | Discharge: 2016-11-14 | Disposition: A | Discharge status: Home / Self Care | Payer: Medicare Other | Source: Ambulatory Visit | Attending: Radiation Oncology | Admitting: Radiation Oncology

## 2016-11-14 DIAGNOSIS — G939 Disorder of brain, unspecified: Secondary | ICD-10-CM | POA: Diagnosis not present

## 2016-11-14 DIAGNOSIS — C7931 Secondary malignant neoplasm of brain: Secondary | ICD-10-CM | POA: Diagnosis not present

## 2016-11-14 DIAGNOSIS — R634 Abnormal weight loss: Secondary | ICD-10-CM | POA: Diagnosis not present

## 2016-11-14 DIAGNOSIS — C7802 Secondary malignant neoplasm of left lung: Secondary | ICD-10-CM | POA: Diagnosis not present

## 2016-11-14 DIAGNOSIS — C3411 Malignant neoplasm of upper lobe, right bronchus or lung: Secondary | ICD-10-CM | POA: Diagnosis not present

## 2016-11-14 DIAGNOSIS — Z9889 Other specified postprocedural states: Secondary | ICD-10-CM | POA: Diagnosis not present

## 2016-11-14 DIAGNOSIS — C7949 Secondary malignant neoplasm of other parts of nervous system: Principal | ICD-10-CM

## 2016-11-14 DIAGNOSIS — Z510 Encounter for antineoplastic radiation therapy: Secondary | ICD-10-CM | POA: Diagnosis not present

## 2016-11-14 MED ORDER — GADOBENATE DIMEGLUMINE 529 MG/ML IV SOLN
20.0000 mL | Freq: Once | INTRAVENOUS | Status: AC | PRN
  Administered 2016-11-14: 20 mL via INTRAVENOUS

## 2016-11-15 ENCOUNTER — Encounter: Payer: Self-pay | Admitting: Radiation Oncology

## 2016-11-15 ENCOUNTER — Ambulatory Visit: Payer: Medicare Other

## 2016-11-15 ENCOUNTER — Ambulatory Visit
Admission: RE | Admit: 2016-11-15 | Discharge: 2016-11-15 | Disposition: A | Discharge status: Home / Self Care | Payer: Medicare Other | Source: Ambulatory Visit | Attending: Radiation Oncology | Admitting: Radiation Oncology

## 2016-11-15 DIAGNOSIS — Z6834 Body mass index (BMI) 34.0-34.9, adult: Secondary | ICD-10-CM | POA: Diagnosis not present

## 2016-11-15 DIAGNOSIS — Z9889 Other specified postprocedural states: Secondary | ICD-10-CM | POA: Diagnosis not present

## 2016-11-15 DIAGNOSIS — Z510 Encounter for antineoplastic radiation therapy: Secondary | ICD-10-CM | POA: Diagnosis not present

## 2016-11-15 DIAGNOSIS — C7802 Secondary malignant neoplasm of left lung: Secondary | ICD-10-CM | POA: Diagnosis not present

## 2016-11-15 DIAGNOSIS — R634 Abnormal weight loss: Secondary | ICD-10-CM | POA: Diagnosis not present

## 2016-11-15 DIAGNOSIS — C7931 Secondary malignant neoplasm of brain: Secondary | ICD-10-CM | POA: Diagnosis not present

## 2016-11-15 DIAGNOSIS — C3411 Malignant neoplasm of upper lobe, right bronchus or lung: Secondary | ICD-10-CM | POA: Diagnosis not present

## 2016-11-16 ENCOUNTER — Ambulatory Visit: Payer: Medicare Other

## 2016-11-16 DIAGNOSIS — Z510 Encounter for antineoplastic radiation therapy: Secondary | ICD-10-CM | POA: Diagnosis not present

## 2016-11-16 DIAGNOSIS — R634 Abnormal weight loss: Secondary | ICD-10-CM | POA: Diagnosis not present

## 2016-11-16 DIAGNOSIS — C7802 Secondary malignant neoplasm of left lung: Secondary | ICD-10-CM | POA: Diagnosis not present

## 2016-11-16 DIAGNOSIS — Z9889 Other specified postprocedural states: Secondary | ICD-10-CM | POA: Diagnosis not present

## 2016-11-16 DIAGNOSIS — C3411 Malignant neoplasm of upper lobe, right bronchus or lung: Secondary | ICD-10-CM | POA: Diagnosis not present

## 2016-11-16 DIAGNOSIS — C7931 Secondary malignant neoplasm of brain: Secondary | ICD-10-CM | POA: Diagnosis not present

## 2016-11-17 ENCOUNTER — Ambulatory Visit
Admission: RE | Admit: 2016-11-17 | Discharge: 2016-11-17 | Disposition: A | Discharge status: Home / Self Care | Payer: Medicare Other | Source: Ambulatory Visit | Attending: Radiation Oncology | Admitting: Radiation Oncology

## 2016-11-17 ENCOUNTER — Telehealth: Payer: Self-pay | Admitting: Internal Medicine

## 2016-11-17 ENCOUNTER — Encounter: Payer: Self-pay | Admitting: Internal Medicine

## 2016-11-17 ENCOUNTER — Other Ambulatory Visit (HOSPITAL_BASED_OUTPATIENT_CLINIC_OR_DEPARTMENT_OTHER): Payer: Medicare Other

## 2016-11-17 ENCOUNTER — Ambulatory Visit (HOSPITAL_BASED_OUTPATIENT_CLINIC_OR_DEPARTMENT_OTHER): Payer: Medicare Other | Admitting: Internal Medicine

## 2016-11-17 ENCOUNTER — Other Ambulatory Visit: Payer: Self-pay | Admitting: Medical Oncology

## 2016-11-17 VITALS — BP 124/72 | HR 70 | Temp 98.0°F | Resp 18

## 2016-11-17 VITALS — BP 122/50 | HR 83 | Temp 98.2°F | Resp 17 | Ht 69.0 in | Wt 232.1 lb

## 2016-11-17 DIAGNOSIS — C7931 Secondary malignant neoplasm of brain: Secondary | ICD-10-CM

## 2016-11-17 DIAGNOSIS — I10 Essential (primary) hypertension: Secondary | ICD-10-CM | POA: Diagnosis not present

## 2016-11-17 DIAGNOSIS — Z7189 Other specified counseling: Secondary | ICD-10-CM

## 2016-11-17 DIAGNOSIS — C3411 Malignant neoplasm of upper lobe, right bronchus or lung: Secondary | ICD-10-CM

## 2016-11-17 DIAGNOSIS — Z510 Encounter for antineoplastic radiation therapy: Secondary | ICD-10-CM | POA: Diagnosis not present

## 2016-11-17 DIAGNOSIS — C7802 Secondary malignant neoplasm of left lung: Secondary | ICD-10-CM | POA: Diagnosis not present

## 2016-11-17 DIAGNOSIS — D630 Anemia in neoplastic disease: Secondary | ICD-10-CM

## 2016-11-17 DIAGNOSIS — R634 Abnormal weight loss: Secondary | ICD-10-CM | POA: Diagnosis not present

## 2016-11-17 DIAGNOSIS — Z9889 Other specified postprocedural states: Secondary | ICD-10-CM | POA: Diagnosis not present

## 2016-11-17 HISTORY — DX: Other specified counseling: Z71.89

## 2016-11-17 LAB — CBC WITH DIFFERENTIAL/PLATELET
BASO%: 0.4 % (ref 0.0–2.0)
Basophils Absolute: 0 10*3/uL (ref 0.0–0.1)
EOS%: 1.1 % (ref 0.0–7.0)
Eosinophils Absolute: 0.1 10*3/uL (ref 0.0–0.5)
HCT: 35.8 % — ABNORMAL LOW (ref 38.4–49.9)
HEMOGLOBIN: 11.5 g/dL — AB (ref 13.0–17.1)
LYMPH%: 13.9 % — AB (ref 14.0–49.0)
MCH: 24.7 pg — ABNORMAL LOW (ref 27.2–33.4)
MCHC: 32 g/dL (ref 32.0–36.0)
MCV: 77.1 fL — ABNORMAL LOW (ref 79.3–98.0)
MONO#: 0.5 10*3/uL (ref 0.1–0.9)
MONO%: 10.2 % (ref 0.0–14.0)
NEUT%: 74.4 % (ref 39.0–75.0)
NEUTROS ABS: 4 10*3/uL (ref 1.5–6.5)
Platelets: 212 10*3/uL (ref 140–400)
RBC: 4.65 10*6/uL (ref 4.20–5.82)
RDW: 16.3 % — AB (ref 11.0–14.6)
WBC: 5.4 10*3/uL (ref 4.0–10.3)
lymph#: 0.7 10*3/uL — ABNORMAL LOW (ref 0.9–3.3)

## 2016-11-17 LAB — COMPREHENSIVE METABOLIC PANEL
ALBUMIN: 3 g/dL — AB (ref 3.5–5.0)
ALK PHOS: 61 U/L (ref 40–150)
ALT: 12 U/L (ref 0–55)
AST: 13 U/L (ref 5–34)
Anion Gap: 12 mEq/L — ABNORMAL HIGH (ref 3–11)
BILIRUBIN TOTAL: 0.46 mg/dL (ref 0.20–1.20)
BUN: 22.7 mg/dL (ref 7.0–26.0)
CO2: 27 meq/L (ref 22–29)
Calcium: 10.4 mg/dL (ref 8.4–10.4)
Chloride: 99 mEq/L (ref 98–109)
Creatinine: 0.9 mg/dL (ref 0.7–1.3)
EGFR: 78 mL/min/{1.73_m2} — AB (ref >90)
GLUCOSE: 156 mg/dL — AB (ref 70–140)
POTASSIUM: 4.3 meq/L (ref 3.5–5.1)
SODIUM: 137 meq/L (ref 136–145)
TOTAL PROTEIN: 6.6 g/dL (ref 6.4–8.3)

## 2016-11-17 MED ORDER — PROCHLORPERAZINE MALEATE 10 MG PO TABS: 10.0000 mg | ORAL_TABLET | Freq: Four times a day (QID) | ORAL | 0 refills | Status: AC | PRN

## 2016-11-17 MED ORDER — DEXAMETHASONE 4 MG PO TABS: ORAL_TABLET | ORAL | 1 refills | Status: AC

## 2016-11-17 MED ORDER — FOLIC ACID 1 MG PO TABS: 1.0000 mg | ORAL_TABLET | Freq: Every day | ORAL | 4 refills | Status: DC

## 2016-11-17 MED ORDER — CYANOCOBALAMIN 1000 MCG/ML IJ SOLN
1000.0000 ug | Freq: Once | INTRAMUSCULAR | Status: AC
  Administered 2016-11-17: 1000 ug via INTRAMUSCULAR

## 2016-11-17 NOTE — Progress Notes (Signed)
1300. Nurse monitoring complete following SRS treatment. Vital stable. Denies pain. Understands to avoid strenuous activity for the next 24 hours. Understands to call [phone] with needs. Follow up appointment card provided to patient by Mont Dutton. Patient left ambulatory in no distress with his niece.

## 2016-11-17 NOTE — Progress Notes (Signed)
[name] [phone]   [phone]) [phone]  OFFICE PROGRESS NOTE  Purvis Kilts, MD [address] [place] [address]  DIAGNOSIS: Stage IV (T3, N2, M1b) poorly differentiated adenocarcinoma diagnosed in November 2017. He was initially diagnosed with a stage IA non-small cell lung cancer, moderately differentiated adenocarcinoma in April 2008. PDL 1 expression 0%.  MOLECULAR STUDIES: Genomic Alterations Identified? BRCA2 R2494* AKT2 amplification - equivocal? TP53 A8f*35 Additional Findings? Microsatellite status MS-Stable Tumor Mutation Burden TMB-Intermediate; 12 Muts/Mb Additional Disease-relevant Genes with No Reportable Alterations Identified? EGFR KRAS ALK BRAF MET RET ERBB2 ROS1  PRIOR THERAPY: 1) status post wedge resection of the right upper lobe with seed implants under the care of Dr. BArlyce Diceon 10/13/2007. 2) palliative radiotherapy to the large right upper lobe lung mass under the care of Dr. MTammi Klippel 3) stereotactic radiotherapy to a solitary left thalamic brain metastasis on 11/17/2016.  CURRENT THERAPY: Systemic chemotherapy with carboplatin for AUC of 5, Alimta 500 MG/M2 and Avastin 15 MG/KG every 3 weeks. First dose 08/30/2016.  INTERVAL HISTORY: Andrew BEZEK82y.o. male returns to the clinic today for follow-up visit accompanied by his daughter. The patient is feeling fine today except for fatigue and shortness of breath at baseline and increased with exertion and dry cough. He denied having any fever or chills. He has no nausea or vomiting. He has no recent weight loss or night sweats. He has no headache or visual changes. He completed palliative radiotherapy to the large right upper lobe lung mass under the care of Dr. MTammi Klippeland he is expected to undergo stereotactic radiotherapy to a solitary brain metastasis later today. He has molecular studies performed by [facility] and he is here today for  evaluation and discussion of his treatment options.  MEDICAL HISTORY: Past Medical History:  Diagnosis Date  . [place]en[place] of right [place], stage 4 [place] 10/13/2016  . Brain metastasis [place] 11/03/2016  . Colon polyps   . Enlarged prostate   . Essential hypertension   . History of kidney stones   . Hypercholesteremia   . [place] [place]   Stage IA non-small cell lung cancer, moderately differentiated adenocarcinoma - VATS and seed implants  . Shortness of breath at rest 10/13/2016  . Thoracic aortic aneurysm (HCC)    4.2 cm in AP diameter   . Type 2 diabetes mellitus (HCC)     ALLERGIES:  is allergic to sulfa antibiotics.  MEDICATIONS:  Current Outpatient Prescriptions  Medication Sig Dispense Refill  . albuterol (PROVENTIL HFA;VENTOLIN HFA) 108 (90 Base) MCG/ACT inhaler Inhale 2 puffs into the lungs every 6 (six) hours as needed for wheezing or shortness of breath. 1 Inhaler 2  . alfuzosin (UROXATRAL) 10 MG 24 hr tablet Take 10 mg by mouth daily.      .Marland Kitchenapixaban (ELIQUIS) 5 MG TABS tablet Take 1 tablet (5 mg total) by mouth 2 (two) times daily. 60 tablet 1  . folic acid (FOLVITE) 8790MCG tablet Take 800 mcg by mouth daily.    . furosemide (LASIX) 40 MG tablet Take 1 tablet (40 mg total) by mouth daily. 90 tablet 3  . glipiZIDE (GLUCOTROL) 5 MG tablet Take 5 mg by mouth 2 (two) times daily before a meal.    . LORazepam (ATIVAN) 1 MG tablet Take 0.5 tablets (0.5 mg total) by mouth every 8 (eight) hours as needed for anxiety (30 min before MRI). 15 tablet 0  . losartan (COZAAR)  100 MG tablet Take 100 mg by mouth daily.    . metoprolol (LOPRESSOR) 50 MG tablet Take 1 tablet (50 mg total) by mouth 2 (two) times daily. 60 tablet 0  . Omega-3 Fatty Acids (FISH OIL) 1000 MG CAPS Take by mouth.    . oxybutynin (DITROPAN-XL) 10 MG 24 hr tablet Take 10 mg by mouth at bedtime.    . potassium chloride (K-DUR) 10 MEQ tablet Take 1 tablet (10 mEq total) by mouth daily. 90 tablet 3   . simvastatin (ZOCOR) 40 MG tablet Take 40 mg by mouth at bedtime.      Marland Kitchen JANUMET 50-1000 MG tablet 1 tablet 2 (two) times daily.    Marland Kitchen LORazepam (ATIVAN) 1 MG tablet Take 1 tablet (1 mg total) by mouth every 8 (eight) hours. (Patient not taking: Reported on 11/17/2016) 30 tablet 0  . Wound Cleansers (RADIAPLEX EX) Apply topically.     No current facility-administered medications for this visit.     SURGICAL HISTORY:  Past Surgical History:  Procedure Laterality Date  . APPENDECTOMY    . CERVICAL SPINE SURGERY    . CHOLECYSTECTOMY    . COLONOSCOPY  04/17/2012   Procedure: COLONOSCOPY;  Surgeon: Jamesetta So, MD;  Location: [facility];  Service: Gastroenterology;  Laterality: N/A;  . COLONOSCOPY W/ POLYPECTOMY    . TONSILLECTOMY    . wedge resection with seed implantation and node sampling with right VATS  03/13/2007    REVIEW OF SYSTEMS:  Constitutional: positive for anorexia and fatigue Eyes: negative Ears, nose, mouth, throat, and face: negative Respiratory: positive for cough, dyspnea on exertion and sputum Cardiovascular: negative Gastrointestinal: negative Genitourinary:negative Integument/breast: negative Hematologic/lymphatic: negative Musculoskeletal:positive for muscle weakness Neurological: negative Behavioral/Psych: negative Endocrine: negative Allergic/Immunologic: negative   PHYSICAL EXAMINATION: General appearance: alert, cooperative, fatigued and no distress Head: Normocephalic, without obvious abnormality, atraumatic Neck: no adenopathy, no JVD, supple, symmetrical, trachea midline and thyroid not enlarged, symmetric, no tenderness/mass/nodules Lymph nodes: Cervical, supraclavicular, and axillary nodes normal. Resp: dullness to percussion RLL, rubs bilaterally and wheezes bilaterally Back: symmetric, no curvature. ROM normal. No CVA tenderness. Cardio: regular rate and rhythm, S1, S2 normal, no murmur, click, rub or gallop GI: soft, non-tender; bowel  sounds normal; no masses,  no organomegaly Extremities: extremities normal, atraumatic, no cyanosis or edema Neurologic: Alert and oriented X 3, normal strength and tone. Normal symmetric reflexes. Normal coordination and gait  ECOG PERFORMANCE STATUS: 1 - Symptomatic but completely ambulatory  Blood pressure (!) 122/50, pulse 83, temperature 98.2 F (36.8 C), temperature source Oral, resp. rate 17, height 5' 9"  (1.753 m), weight 232 lb 1.6 oz (105.3 kg), SpO2 97 %.  LABORATORY DATA: Lab Results  Component Value Date   WBC 5.4 11/17/2016   HGB 11.5 (L) 11/17/2016   HCT 35.8 (L) 11/17/2016   MCV 77.1 (L) 11/17/2016   PLT 212 11/17/2016      Chemistry      Component Value Date/Time   NA 137 11/17/2016 0848   K 4.3 11/17/2016 0848   CL 101 10/18/2016 1150   CL 104 10/30/2012 0805   CO2 27 11/17/2016 0848   BUN 22.7 11/17/2016 0848   CREATININE 0.9 11/17/2016 0848      Component Value Date/Time   CALCIUM 10.4 11/17/2016 0848   ALKPHOS 61 11/17/2016 0848   AST 13 11/17/2016 0848   ALT 12 11/17/2016 0848   BILITOT 0.46 11/17/2016 0848       RADIOGRAPHIC STUDIES: Mr Kizzie Fantasia  Contrast  Result Date: 11/14/2016 CLINICAL DATA:  82 year old male with lung cancer. SRS planning exam. Subsequent encounter. EXAM: MRI HEAD WITHOUT AND WITH CONTRAST TECHNIQUE: Multiplanar, multiecho pulse sequences of the brain and surrounding structures were obtained without and with intravenous contrast. CONTRAST:  27m MULTIHANCE GADOBENATE DIMEGLUMINE 529 MG/ML IV SOLN COMPARISON:  10/31/2016 MR. FINDINGS: Brain: 8 mm ring-enhancing lesion posterior aspect left thalamus consistent with solitary metastatic lesion. Areas of enhancement surrounding the ventricles have an appearance most suggestive of vessels rather than metastatic disease. No acute infarct or intracranial hemorrhage. Mild chronic microvascular changes. Global atrophy without hydrocephalus. Vascular: Major intracranial vascular structures  are patent. Skull and upper cervical spine: Mild transverse ligament hypertrophy. Cervical spondylotic changes C4-5 and C5-6 with slight cord flattening C5-6 level. Sinuses/Orbits: Post lens replacement without acute orbital abnormality. Left maxillary sinus mucosal thickening most notable inferiorly measuring up to 8 mm. Mild ethmoid sinus mucosal thickening greater on the left. Other: Negative IMPRESSION: 8 mm ring-enhancing lesion posterior aspect left thalamus consistent with solitary metastatic lesion. Areas of enhancement surrounding the ventricles have an appearance most suggestive of vessels rather than metastatic disease. Mild chronic microvascular changes. Global atrophy without hydrocephalus. Cervical spondylotic changes C4-5 and C5-6 with slight cord flattening C5-6 level. Left maxillary sinus mucosal thickening most notable inferiorly measuring up to 8 mm. Mild ethmoid sinus mucosal thickening greater on the left. Electronically Signed   By: SGenia DelM.D.   On: 11/14/2016 15:57   Mr BJeri CosWLKContrast  Result Date: 10/31/2016 CLINICAL DATA:  82year old male with lung cancer. Evaluate for possible intracranial metastatic disease. Initial encounter. EXAM: MRI HEAD WITHOUT AND WITH CONTRAST TECHNIQUE: Multiplanar, multiecho pulse sequences of the brain and surrounding structures were obtained without and with intravenous contrast. CONTRAST:  230mMULTIHANCE GADOBENATE DIMEGLUMINE 529 MG/ML IV SOLN COMPARISON:  09/09/2015 head CT.  No comparison brain MR. FINDINGS: Brain: No acute infarct or intracranial hemorrhage. Posterior left thalamic 8 x 7 x 6 mm ring-like enhancing lesion which in the present clinical setting is suspicious for a solitary intracranial metastatic lesion. Mild chronic microvascular changes. Mild global atrophy without hydrocephalus. Vascular: Major intracranial vascular structures are patent. Skull and upper cervical spine: Mild transverse ligament hypertrophy.  Sinuses/Orbits: Post lens replacement without acute orbital abnormality. Minimal mucosal thickening ethmoid sinus air cells. Other: Negative. IMPRESSION: Solitary 8 mm posterior left thalamic metastatic lesion suspected as detailed above. Electronically Signed   By: StGenia Del.D.   On: 10/31/2016 17:21    ASSESSMENT AND PLAN: This is a very pleasant [age] old white male with recurrent non-small cell lung cancer with extensive disease in the right side of the chest and solitary brain metastasis. He underwent palliative radiotherapy to the large right upper lobe lung mass and he is expected to undergo stereotactic radiotherapy to the solitary brain metastasis later today. Unfortunately the molecular studies showed no actionable mutations and negative for PDL 1 expression. I had a lengthy discussion with the patient and his daughter about his current condition and treatment options. I discussed with him the goals of care. They understand that he has incurable condition and also treatment will be off palliative nature. He was given the option of palliative care and hospice referral versus consideration of systemic chemotherapy with carboplatin for AUC of 5, Alimta 500 MG/M2 and Avastin 15 MG/KG every 3 weeks. I discussed with the patient adverse effects of this treatment including but not limited to alopecia, myelosuppression, nausea and vomiting, peripheral neuropathy, liver  or renal dysfunction in addition to the adverse effect of Avastin including risk for pulmonary hemorrhage, GI perforation and won't healing daily. I also provided the patient with handouts about the 3 chemotherapy drugs.. We will arrange for the patient to receive vitamin B 12 injection today. The patient would also receive prescription for Compazine 10 mg by mouth every 6 hours as needed for nausea, Decadron 4 mg by mouth twice a day, the day before, day of and day after the chemotherapy in addition to folic acid 1 mg by mouth  daily. I will arrange for the patient to have a chemotherapy education class before starting the first dose of the chemotherapy. He would like to proceed with chemotherapy and he is expected to start the first dose of this treatment on 11/30/2016. For the anemia of neoplastic disease, we'll continue to monitor the patient closely for now and consider him for transfusion if needed. For hypertension he will continue on the current blood pressure medication was losartan For the history of atrial fibrillation, he will continue on Eliquis. The patient would come back for follow-up visit in 3 weeks for evaluation and management of any adverse effect of his treatment. He was advised to call immediately if he has any concerning symptoms in the interval.  The patient voices understanding of current disease status and treatment options and is in agreement with the current care plan.  All questions were answered. The patient knows to call the clinic with any problems, questions or concerns. We can certainly see the patient much sooner if necessary.  I spent 15 minutes counseling the patient face to face. The total time spent in the appointment was 25 minutes.  Disclaimer: This note was dictated with voice recognition software. Similar sounding words can inadvertently be transcribed and may not be corrected upon review.

## 2016-11-17 NOTE — Telephone Encounter (Signed)
Chemo education scheduled for 11/23/16, per 11/17/16 los. Labs was scheduled weekly with chemo, per 11/17/16 los. Chemo and follow up appointments was scheduled with Dr Julien Nordmann every 3 weeks, with 1st follow up appointment scheduled, 1 week after 1st treatment,  per 11/17/16 los.   A copy of the appointment schedule and AVS report was give to the patient, per 11/17/16 los.

## 2016-11-17 NOTE — Progress Notes (Signed)
  Radiation Oncology         [phone] ________________________________  Stereotactic Treatment Procedure Note  Name: [name] MRN: [id number]  Date: 11/17/2016  DOB: 1927-06-16  SPECIAL TREATMENT PROCEDURE    ICD-9-CM ICD-10-CM   1. Solitary left thalamic 8 mm brain metastasis 198.3 C79.31     3D TREATMENT PLANNING AND DOSIMETRY:  The patient's radiation plan was reviewed and approved by neurosurgery and radiation oncology prior to treatment.  It showed 3-dimensional radiation distributions overlaid onto the planning CT/MRI image set.  The [facility] for the target structures as well as the organs at risk were reviewed. The documentation of the 3D plan and dosimetry are filed in the radiation oncology EMR.  NARRATIVE:  [name] was brought to the TrueBeam stereotactic radiation treatment machine and placed supine on the CT couch. The head frame was applied, and the patient was set up for stereotactic radiosurgery.  Neurosurgery was present for the set-up and delivery  SIMULATION VERIFICATION:  In the couch zero-angle position, the patient underwent Exactrac imaging using the Brainlab system with orthogonal KV images.  These were carefully aligned and repeated to confirm treatment position for each of the isocenters.  The Exactrac snap film verification was repeated at each couch angle.  PROCEDURE: [name] received stereotactic radiosurgery to the following targets: Left thalamic 8 mm target was treated using 4 Dynamic Conformal Arcs to a prescription dose of 20 Gy.  ExacTrac registration was performed for each couch angle.  The 80% isodose line was prescribed.  6 MV X-rays were delivered in the flattening filter free beam mode.  STEREOTACTIC TREATMENT MANAGEMENT:  Following delivery, the patient was transported to nursing in stable condition and monitored for possible acute effects.  Vital signs were recorded . The patient tolerated treatment without significant acute  effects, and was discharged to home in stable condition.    PLAN: Continue chest radiation and then follow-up in one month.  ________________________________  Sheral Apley. Tammi Klippel, M.D.

## 2016-11-17 NOTE — Op Note (Signed)
Stereotactic Radiosurgery Operative Note  Name: YOSHIAKI KREUSER MRN: [id number]  Date: 11/17/2016  DOB: [birth year]  Op Note  Pre Operative Diagnosis:  Metastatic lung cancer with single left thalamic 8 mm in diameter metastasis  Post Operative Diagnois:  Metastatic lung cancer with single left thalamic 8 mm in diameter metastasis  3D TREATMENT PLANNING AND DOSIMETRY:  The patient's radiation plan was reviewed and approved by myself (neurosurgery) and Dr. Ledon Snare (radiation oncology) prior to treatment.  It showed 3-dimensional radiation distributions overlaid onto the planning CT/MRI image set.  The [facility] for the target structures as well as the organs at risk were reviewed. The documentation of the 3D plan and dosimetry are filed in the radiation oncology EMR.  NARRATIVE:  DARAN FAVARO was brought to the TrueBeam stereotactic radiation treatment machine and placed supine on the CT couch. The head frame was applied, and the patient was set up for stereotactic radiosurgery.  I was present for the set-up and delivery.  SIMULATION VERIFICATION:  In the couch zero-angle position, the patient underwent Exactrac imaging using the Brainlab system with orthogonal KV images.  These were carefully aligned and repeated to confirm treatment position for each of the isocenters.  The Exactrac snap film verification was repeated at each couch angle.  SPECIAL TREATMENT PROCEDURE: ELVA BREAKER received stereotactic radiosurgery to the following targets: Left thalamic target was treated using 4 Dynamic Conformal Arcs to a prescription dose of 20 Gy.  ExacTrac registration was performed for each couch angle.  The 80% isodose line was prescribed.  STEREOTACTIC TREATMENT MANAGEMENT:  Following delivery, the patient was transported to nursing in stable condition and monitored for possible acute effects.  The patient tolerated treatment without significant acute effects, and was discharged to home in stable  condition.    PLAN: Follow-up in one month.

## 2016-11-17 NOTE — Progress Notes (Signed)
START ON PATHWAY REGIMEN - Non-Small Cell Lung  [id number]: Carboplatin AUC=5 + Pemetrexed 500 mg/m2 + Bevacizumab 15 mg/kg q21 Days x 4 Cycles   A cycle is every 21 days:     Carboplatin (Paraplatin(R)) AUC=5 in 250 mL NS IV over 1 hour Dose Mod: None     Pemetrexed (Alimta(R)) 500 mg/m2 in 100 mL NS IV over 10 minutes, manufacturer recommends not administering to patients with CrCl < 45 mL/min Dose Mod: None     Bevacizumab (Avastin(R)) 15 mg/kg in 100 mL NS IV over 90 minutes first infusion, 60 minutes second infusion and 30 minutes all subsequent infusions if tolerated Dose Mod: None Additional Orders: * All AUC calculations intended to be used in [employer] formula Note: Patient to receive the following prior to the initiation of therapy: 1) Dexamethasone 4 mg orally twice daily x 6 doses.  First dose 24 hours before chemotherapy. 2) Folic acid >= 549 mcg orally daily.  First dose at least 5 days prior to the first dose of pemetrexed. 3) Vitamin B12 1,000 mcg intramuscularly every 9 weeks.  First dose at least 5 days prior to the first dose of pemetrexed.  **Always confirm dose/schedule in your pharmacy ordering system**    Patient Characteristics: Stage IV Metastatic, Non Squamous, Initial Chemotherapy/Immunotherapy, PS = 0, 1, PD-L1 Expression Positive 1-49% (TPS) / Negative / Not Tested / Not a Candidate for Immunotherapy Check here if patient was staged using an edition prior to AJCC Staging - 8th Edition (i.e., prior to November 28, 2016)? false AJCC T Category: T3 Current Disease Status: Distant Metastases AJCC N Category: N2 AJCC M Category: M1c AJCC 8 Stage Grouping: IVB Histology: Non Squamous Cell ROS1 Rearrangement Status: Negative T790M Mutation Status: Not Applicable - EGFR Mutation Negative/Unknown Other Mutations/Biomarkers: No Other Actionable Mutations PD-L1 Expression Status: PD-L1 Negative Chemotherapy/Immunotherapy LOT: Initial Chemotherapy/Immunotherapy Molecular  Targeted Therapy: Not Appropriate ALK Translocation Status: Negative Would you be surprised if this patient died  in the next year? I would NOT be surprised if this patient died in the next year EGFR Mutation Status: Negative/Wild Type BRAF V600E Mutation Status: Negative Performance Status: PS = 0, 1  Intent of Therapy: Non-Curative / Palliative Intent, Discussed with Patient

## 2016-11-17 NOTE — Addendum Note (Signed)
Encounter addended by: Heywood Footman, RN on: 11/17/2016  1:13 PM<BR>    Actions taken: Chief Complaint modified, Vitals modified, Order Reconciliation Section accessed, Sign clinical note

## 2016-11-23 ENCOUNTER — Telehealth: Payer: Self-pay | Admitting: Internal Medicine

## 2016-11-23 ENCOUNTER — Ambulatory Visit (HOSPITAL_BASED_OUTPATIENT_CLINIC_OR_DEPARTMENT_OTHER): Payer: Medicare Other | Admitting: Nurse Practitioner

## 2016-11-23 ENCOUNTER — Encounter: Payer: Self-pay | Admitting: *Deleted

## 2016-11-23 ENCOUNTER — Encounter: Payer: Self-pay | Admitting: Nurse Practitioner

## 2016-11-23 ENCOUNTER — Telehealth: Payer: Self-pay | Admitting: Emergency Medicine

## 2016-11-23 ENCOUNTER — Other Ambulatory Visit: Payer: Medicare Other

## 2016-11-23 VITALS — BP 109/60 | HR 86 | Temp 97.8°F | Resp 18 | Wt 231.1 lb

## 2016-11-23 DIAGNOSIS — C7931 Secondary malignant neoplasm of brain: Secondary | ICD-10-CM | POA: Diagnosis not present

## 2016-11-23 DIAGNOSIS — C3491 Malignant neoplasm of unspecified part of right bronchus or lung: Secondary | ICD-10-CM | POA: Diagnosis not present

## 2016-11-23 DIAGNOSIS — R609 Edema, unspecified: Secondary | ICD-10-CM

## 2016-11-23 DIAGNOSIS — R600 Localized edema: Secondary | ICD-10-CM | POA: Insufficient documentation

## 2016-11-23 DIAGNOSIS — J40 Bronchitis, not specified as acute or chronic: Secondary | ICD-10-CM

## 2016-11-23 DIAGNOSIS — C3411 Malignant neoplasm of upper lobe, right bronchus or lung: Secondary | ICD-10-CM

## 2016-11-23 DIAGNOSIS — C7800 Secondary malignant neoplasm of unspecified lung: Secondary | ICD-10-CM

## 2016-11-23 MED ORDER — ALBUTEROL SULFATE (2.5 MG/3ML) 0.083% IN NEBU
2.5000 mg | INHALATION_SOLUTION | Freq: Once | RESPIRATORY_TRACT | Status: AC
  Administered 2016-11-23: 2.5 mg via RESPIRATORY_TRACT
  Filled 2016-11-23: qty 3

## 2016-11-23 MED ORDER — ALBUTEROL SULFATE (2.5 MG/3ML) 0.083% IN NEBU
INHALATION_SOLUTION | RESPIRATORY_TRACT | Status: AC
  Filled 2016-11-23: qty 3

## 2016-11-23 MED ORDER — LEVOFLOXACIN 500 MG PO TABS: 500.0000 mg | ORAL_TABLET | Freq: Every day | ORAL | 0 refills | Status: DC

## 2016-11-23 NOTE — Telephone Encounter (Signed)
No LOS per 11/23/16 los/D.O.S.

## 2016-11-23 NOTE — Assessment & Plan Note (Signed)
Patient has a history of chronic bilateral lower extremity edema.  He takes Lasix  and potassium tablets daily as directed.  He states that he has noticed some increased edema to his bilateral lower extremities recently.  He denies any increase shortness of breath or chest pain/chest pressure.  Exam today reveals +2 to +3 edema to bilateral lower extremities.  Patient was advised to continue taking the Lasix as directed.  He was also advised he should elevate his legs above the level of his heart whenever he is at rest and to try compression stockings as well.  Also, patient was advised to follow-up with his primary care provider regarding any issues with worsening lower extremity edema.

## 2016-11-23 NOTE — Assessment & Plan Note (Signed)
Patient has been diagnosed with lung cancer with brain metastasis.  He completed 10 radiation treatments to his lungs, with the final radiation treatment, obtained on 11/15/2016.  He had one radiation treatment to the brain as well which was performed on 11/17/2016.  Patient is scheduled to initiate carboplatin/Alimta/Avastin chemotherapy regimen on 12/02/2015.  He will return for labs and a follow-up visit on 12/07/2016.  Note: Patient has a history of diabetes and takes oral medication for control of diabetes.  Advised both patient and his family member that the steroids that are included as part of the chemotherapy regimen may very well increased patient's blood sugar so.  He should keep it close eye on his blood sugar in the future.

## 2016-11-23 NOTE — Patient Instructions (Signed)

## 2016-11-23 NOTE — Progress Notes (Signed)
SYMPTOM MANAGEMENT CLINIC    Chief Complaint: Bronchitis  HPI:  [name] 82 y.o. male diagnosed with lung cancer with brain metastasis.  Patient is status post both lung and brain irradiation.  Scheduled to initiate carboplatin/Alimta/Avastin chemotherapy regimen next week.    No history exists.    Review of Systems  Constitutional: Positive for malaise/fatigue.  Respiratory: Positive for cough, sputum production and wheezing.   All other systems reviewed and are negative.   Past Medical History:  Diagnosis Date  . Adenocarcinoma of right lung, stage 4 ([place]) 10/13/2016  . Brain metastasis ([place]) 11/03/2016  . Colon polyps   . Enlarged prostate   . Essential hypertension   . Goals of care, counseling/discussion 11/17/2016  . History of kidney stones   . Hypercholesteremia   . Lung cancer ([place]) Dx'd 01/2007   Stage IA non-small cell lung cancer, moderately differentiated adenocarcinoma - VATS and seed implants  . Shortness of breath at rest 10/13/2016  . Thoracic aortic aneurysm ([facility])    4.2 cm in AP diameter   . Type 2 diabetes mellitus ([place])     Past Surgical History:  Procedure Laterality Date  . APPENDECTOMY    . CERVICAL SPINE SURGERY    . CHOLECYSTECTOMY    . COLONOSCOPY  04/17/2012   Procedure: COLONOSCOPY;  Surgeon: Jamesetta So, MD;  Location: [facility];  Service: Gastroenterology;  Laterality: N/A;  . COLONOSCOPY W/ POLYPECTOMY    . TONSILLECTOMY    . 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    is allergic to sulfa antibiotics.  Allergies as of 11/23/2016      Reactions   Sulfa Antibiotics Rash      Medication List       Accurate as of 11/23/16  2:05 PM. Always use your most recent med list.          albuterol 108 (90 Base) MCG/ACT inhaler Commonly known as:  PROVENTIL HFA;VENTOLIN HFA Inhale 2 puffs into the lungs every 6 (six) hours as needed for wheezing or shortness of breath.   apixaban 5 MG Tabs tablet Commonly known as:  ELIQUIS Take 1 tablet (5 mg total) by mouth 2 (two) times daily.   dexamethasone 4 MG tablet Commonly known as:  DECADRON 4 mg by mouth twice a day the day before, day of and day after the chemotherapy every 3 weeks   Fish Oil 1000 MG Caps Take by mouth.   folic acid 284 MCG tablet Commonly known as:  FOLVITE Take 800 mcg by mouth daily.   folic acid 1 MG tablet Commonly known as:  FOLVITE Take 1 tablet (1 mg total) by mouth daily.   furosemide 40 MG tablet Commonly known as:  LASIX Take 1 tablet (40 mg total) by mouth daily.   glipiZIDE 5 MG tablet Commonly known as:  GLUCOTROL Take 5 mg by mouth 2 (two) times daily before a meal.   JANUMET 50-1000 MG tablet Generic  drug:  sitaGLIPtin-metformin 1 tablet 2 (two) times daily.   levofloxacin 500 MG tablet Commonly known as:  LEVAQUIN Take 1 tablet (500 mg total) by mouth daily.   LORazepam 1 MG tablet Commonly known as:  ATIVAN Take 0.5 tablets (0.5 mg total) by mouth every 8 (eight) hours as needed for anxiety (30 min before MRI).   losartan 100 MG tablet Commonly known as:  COZAAR Take 100 mg by mouth daily.   metoprolol 50 MG tablet Commonly known as:  LOPRESSOR Take 1 tablet (50 mg total) by mouth 2 (two) times daily.   oxybutynin 10 MG 24 hr tablet Commonly known as:  DITROPAN-XL Take 10 mg by mouth at bedtime.     potassium chloride 10 MEQ tablet Commonly known as:  K-DUR Take 1 tablet (10 mEq total) by mouth daily.   prochlorperazine 10 MG tablet Commonly known as:  COMPAZINE Take 1 tablet (10 mg total) by mouth every 6 (six) hours as needed for nausea or vomiting.   RADIAPLEX EX Apply topically.   simvastatin 40 MG tablet Commonly known as:  ZOCOR Take 40 mg by mouth at bedtime.   UROXATRAL 10 MG 24 hr tablet Generic drug:  alfuzosin Take 10 mg by mouth daily.        PHYSICAL EXAMINATION  Oncology Vitals 11/23/2016 11/17/2016  Height - -  Weight 104.826 kg -  Weight (lbs) 231 lbs 2 oz -  BMI (kg/m2) 34.13 kg/m2 -  Temp 97.8 98  Pulse 86 70  Resp 18 18  Resp (Historical as of 06/28/12) - -  SpO2 98 99  BSA (m2) 2.26 m2 -   BP Readings from Last 2 Encounters:  11/23/16 109/60  11/17/16 124/72    Physical Exam  Constitutional: He is oriented to person, place, and time. Vital signs are normal. He appears unhealthy.  HENT:  Head: Normocephalic and atraumatic.  Mouth/Throat: Oropharynx is clear and moist.  Eyes: Conjunctivae and EOM are normal. Pupils are equal, round, and reactive to light. Right eye exhibits no discharge. Left eye exhibits no discharge. No scleral icterus.  Neck: Normal range of motion. Neck supple. No JVD present. No tracheal deviation present. No thyromegaly present.  Cardiovascular: Normal rate, regular rhythm, normal heart sounds and intact distal pulses.   Pulmonary/Chest: Effort normal. No respiratory distress. He has wheezes. He has rales. He exhibits no tenderness.  Abdominal: Soft. Bowel sounds are normal. He exhibits no distension and no mass. There is no tenderness. There is no rebound and no guarding.  Musculoskeletal: Normal range of motion. He exhibits no edema, tenderness or deformity.  Lymphadenopathy:    He has no cervical adenopathy.  Neurological: He is alert and oriented to person, place, and time. Gait normal.  Skin: Skin is warm and  dry. No rash noted. No erythema. There is pallor.  Psychiatric: Affect normal.  Nursing note and vitals reviewed.   LABORATORY DATA:. No visits with results within 3 Day(s) from this visit.  Latest known visit with results is:  Appointment on 11/17/2016  Component Date Value Ref Range Status  . WBC 11/17/2016 5.4  4.0 - 10.3 10e3/uL Final  . NEUT# 11/17/2016 4.0  1.5 - 6.5 10e3/uL Final  . HGB 11/17/2016 11.5* 13.0 - 17.1 g/dL Final  . HCT 11/17/2016 35.8* 38.4 - 49.9 % Final  . Platelets 11/17/2016 212  140 - 400 10e3/uL Final  . MCV 11/17/2016 77.1* 79.3 - 98.0 fL Final  . MCH 11/17/2016 24.7* 27.2 - 33.4 pg  Final  . MCHC 11/17/2016 32.0  32.0 - 36.0 g/dL Final  . RBC 11/17/2016 4.65  4.20 - 5.82 10e6/uL Final  . RDW 11/17/2016 16.3* 11.0 - 14.6 % Final  . lymph# 11/17/2016 0.7* 0.9 - 3.3 10e3/uL Final  . MONO# 11/17/2016 0.5  0.1 - 0.9 10e3/uL Final  . Eosinophils Absolute 11/17/2016 0.1  0.0 - 0.5 10e3/uL Final  . Basophils Absolute 11/17/2016 0.0  0.0 - 0.1 10e3/uL Final  . NEUT% 11/17/2016 74.4  39.0 - 75.0 % Final  . LYMPH% 11/17/2016 13.9* 14.0 - 49.0 % Final  . MONO% 11/17/2016 10.2  0.0 - 14.0 % Final  . EOS% 11/17/2016 1.1  0.0 - 7.0 % Final  . BASO% 11/17/2016 0.4  0.0 - 2.0 % Final  . Sodium 11/17/2016 137  136 - 145 mEq/L Final  . Potassium 11/17/2016 4.3  3.5 - 5.1 mEq/L Final  . Chloride 11/17/2016 99  98 - 109 mEq/L Final  . CO2 11/17/2016 27  22 - 29 mEq/L Final  . Glucose 11/17/2016 156* 70 - 140 mg/dl Final  . BUN 11/17/2016 22.7  7.0 - 26.0 mg/dL Final  . Creatinine 11/17/2016 0.9  0.7 - 1.3 mg/dL Final  . Total Bilirubin 11/17/2016 0.46  0.20 - 1.20 mg/dL Final  . Alkaline Phosphatase 11/17/2016 61  40 - 150 U/L Final  . AST 11/17/2016 13  5 - 34 U/L Final  . ALT 11/17/2016 12  0 - 55 U/L Final  . Total Protein 11/17/2016 6.6  6.4 - 8.3 g/dL Final  . Albumin 11/17/2016 3.0* 3.5 - 5.0 g/dL Final  . Calcium 11/17/2016 10.4  8.4 - 10.4 mg/dL Final  .  Anion Gap 11/17/2016 12* 3 - 11 mEq/L Final  . EGFR 11/17/2016 78* >90 ml/min/1.73 m2 Final    RADIOGRAPHIC STUDIES: No results found.  ASSESSMENT/PLAN:    Peripheral edema Patient has a history of chronic bilateral lower extremity edema.  He takes Lasix  and potassium tablets daily as directed.  He states that he has noticed some increased edema to his bilateral lower extremities recently.  He denies any increase shortness of breath or chest pain/chest pressure.  Exam today reveals +2 to +3 edema to bilateral lower extremities.  Patient was advised to continue taking the Lasix as directed.  He was also advised he should elevate his legs above the level of his heart whenever he is at rest and to try compression stockings as well.  Also, patient was advised to follow-up with his primary care provider regarding any issues with worsening lower extremity edema.    Bronchitis Patient has been diagnosed with lung cancer with brain metastasis.  He completed 10 radiation treatments to his lungs, with the final radiation treatment, obtained on 11/15/2016.  He had one radiation treatment to the brain as well which was performed on 11/17/2016.  Patient presented to the [facility] today with complaint of increased congested cough and wheezing.  He states that he is coughing up some thick brown secretions at times.  He denies any hemoptysis.  He also denies any recent fevers or chills.  Exam today reveals some mild rhonchi and increased wheezes to all lung fields.  Patient has  what appears to be some bronchospasms as well.Marland Kitchen  He is in no acute respiratory distress.  He was afebrile today.  Patient was given an albuterol nebulizer treatment 1, which decreased patient's wheezing.  Differential diagnosis includes bronchitis versus pneumonitis versus the lung cancer diagnosis.  Will  prescribe Levaquin antibiotics for treatment of any bronchitis infection.  Confirmed the patient has refills of his  albuterol inhaler that he continues at home.  Patient was advised to call/return or go directly to the emergency department for any worsening symptoms whatsoever.    Adenocarcinoma of right lung, stage 4 ([facility]) Patient has been diagnosed with lung cancer with brain metastasis.  He completed 10 radiation treatments to his lungs, with the final radiation treatment, obtained on 11/15/2016.  He had one radiation treatment to the brain as well which was performed on 11/17/2016.  Patient is scheduled to initiate carboplatin/Alimta/Avastin chemotherapy regimen on 12/02/2015.  He will return for labs and a follow-up visit on 12/07/2016.  Note: Patient has a history of diabetes and takes oral medication for control of diabetes.  Advised both patient and his family member that the steroids that are included as part of the chemotherapy regimen may very well increased patient's blood sugar so.  He should keep it close eye on his blood sugar in the future.         Patient stated understanding of all instructions; and was in agreement with this plan of care. The patient knows to call the clinic with any problems, questions or concerns.   Total time spent with patient was 25 minutes;  with greater than 75 percent of that time spent in face to face counseling regarding patient's symptoms,  and coordination of care and follow up.  Disclaimer:This dictation was prepared with Dragon/digital dictation along with [employer]. Any transcriptional errors that result from this process are unintentional.  Drue Second, NP 11/23/2016

## 2016-11-23 NOTE — Assessment & Plan Note (Signed)
Patient has been diagnosed with lung cancer with brain metastasis.  He completed 10 radiation treatments to his lungs, with the final radiation treatment, obtained on 11/15/2016.  He had one radiation treatment to the brain as well which was performed on 11/17/2016.  Patient presented to the [facility] today with complaint of increased congested cough and wheezing.  He states that he is coughing up some thick brown secretions at times.  He denies any hemoptysis.  He also denies any recent fevers or chills.  Exam today reveals some mild rhonchi and increased wheezes to all lung fields.  Patient has  what appears to be some bronchospasms as well.Marland Kitchen  He is in no acute respiratory distress.  He was afebrile today.  Patient was given an albuterol nebulizer treatment 1, which decreased patient's wheezing.  Differential diagnosis includes bronchitis versus pneumonitis versus the lung cancer diagnosis.  Will prescribe Levaquin antibiotics for treatment of any bronchitis infection.  Confirmed the patient has refills of his albuterol inhaler that he continues at home.  Patient was advised to call/return or go directly to the emergency department for any worsening symptoms whatsoever.

## 2016-11-23 NOTE — Telephone Encounter (Signed)
Patient's niece called with an update on patient's cough; patient was previously instructed to take Dayquil and Nyquil for cough and now states that cough is now producing a brownish colored sputum.   Dr Julien Nordmann aware and suggests patient be seen in [facility] for these complaints.   Appointment made and patient aware.

## 2016-11-27 NOTE — Progress Notes (Signed)
  Radiation Oncology         [phone] ________________________________  Name: [name] MRN: [id number]  Date: 11/15/2016  DOB: Apr 16, 1927  End of Treatment Note  Diagnosis:   82 yo man with metastatic (T3, N2 M1a) poorly differentiated adenocarcinoma of the right upper lung, stage IV in an area previously irradiated with brachytherapy and an 8 mm left thalamic brain met     Indication for treatment:  Palliation       Radiation treatment dates:   11/02/16-11/15/16  Site/dose:    1.  The right upper lung was treated 30 Gy in 10 fractions of 3 Gy 2.  Left thalamic 8 mm target was treated to a prescription dose of 20 Gy.    Beams/energy:   1.  The right upper lung was treated using 6 and 10 MV X-rays 2.  Left thalamic 8 mm target was treated using 4 Dynamic Conformal Arcs.  ExacTrac registration was performed for each couch angle.  The 80% isodose line was prescribed.  6 MV X-rays were delivered in the flattening filter free beam mode.  Narrative: The patient tolerated radiation treatment relatively well.   Denies pain. Reports his cough is less frequent. Reports his cough is productive with clear sputum. Denies hemoptysis. Denies pain or difficulty associated with swallowing. Reports SOB seems improved. No skin changes noted within treatment filed.   Plan: The patient has completed radiation treatment. The patient will return to radiation oncology clinic for routine followup in one month. I advised him to call or return sooner if he has any questions or concerns related to his recovery or treatment. ________________________________  Sheral Apley. Tammi Klippel, M.D.

## 2016-11-29 ENCOUNTER — Other Ambulatory Visit: Payer: Self-pay | Admitting: Internal Medicine

## 2016-11-30 ENCOUNTER — Other Ambulatory Visit: Payer: Medicare Other

## 2016-12-01 ENCOUNTER — Ambulatory Visit (HOSPITAL_BASED_OUTPATIENT_CLINIC_OR_DEPARTMENT_OTHER): Payer: Medicare Other

## 2016-12-01 ENCOUNTER — Other Ambulatory Visit (HOSPITAL_BASED_OUTPATIENT_CLINIC_OR_DEPARTMENT_OTHER): Payer: Medicare Other

## 2016-12-01 VITALS — BP 100/55 | HR 86 | Temp 98.2°F | Resp 18

## 2016-12-01 DIAGNOSIS — C7931 Secondary malignant neoplasm of brain: Secondary | ICD-10-CM

## 2016-12-01 DIAGNOSIS — C3411 Malignant neoplasm of upper lobe, right bronchus or lung: Secondary | ICD-10-CM

## 2016-12-01 DIAGNOSIS — Z5111 Encounter for antineoplastic chemotherapy: Secondary | ICD-10-CM | POA: Diagnosis present

## 2016-12-01 DIAGNOSIS — Z5112 Encounter for antineoplastic immunotherapy: Secondary | ICD-10-CM

## 2016-12-01 LAB — CBC WITH DIFFERENTIAL/PLATELET
BASO%: 0 % (ref 0.0–2.0)
Basophils Absolute: 0 10*3/uL (ref 0.0–0.1)
EOS ABS: 0 10*3/uL (ref 0.0–0.5)
EOS%: 0 % (ref 0.0–7.0)
HCT: 34.2 % — ABNORMAL LOW (ref 38.4–49.9)
HGB: 11.2 g/dL — ABNORMAL LOW (ref 13.0–17.1)
LYMPH%: 7.1 % — AB (ref 14.0–49.0)
MCH: 24.7 pg — ABNORMAL LOW (ref 27.2–33.4)
MCHC: 32.7 g/dL (ref 32.0–36.0)
MCV: 75.3 fL — AB (ref 79.3–98.0)
MONO#: 0.4 10*3/uL (ref 0.1–0.9)
MONO%: 4.4 % (ref 0.0–14.0)
NEUT%: 88.5 % — ABNORMAL HIGH (ref 39.0–75.0)
NEUTROS ABS: 8.4 10*3/uL — AB (ref 1.5–6.5)
PLATELETS: 197 10*3/uL (ref 140–400)
RBC: 4.54 10*6/uL (ref 4.20–5.82)
RDW: 16.3 % — ABNORMAL HIGH (ref 11.0–14.6)
WBC: 9.5 10*3/uL (ref 4.0–10.3)
lymph#: 0.7 10*3/uL — ABNORMAL LOW (ref 0.9–3.3)
nRBC: 0 % (ref 0–0)

## 2016-12-01 LAB — COMPREHENSIVE METABOLIC PANEL
ALT: 10 U/L (ref 0–55)
ANION GAP: 13 meq/L — AB (ref 3–11)
AST: 10 U/L (ref 5–34)
Albumin: 3.1 g/dL — ABNORMAL LOW (ref 3.5–5.0)
Alkaline Phosphatase: 65 U/L (ref 40–150)
BUN: 27.5 mg/dL — ABNORMAL HIGH (ref 7.0–26.0)
CO2: 23 meq/L (ref 22–29)
Calcium: 10.8 mg/dL — ABNORMAL HIGH (ref 8.4–10.4)
Chloride: 99 mEq/L (ref 98–109)
Creatinine: 0.8 mg/dL (ref 0.7–1.3)
EGFR: 82 mL/min/{1.73_m2} — AB (ref >90)
Glucose: 133 mg/dl (ref 70–140)
Potassium: 4.3 mEq/L (ref 3.5–5.1)
Sodium: 135 mEq/L — ABNORMAL LOW (ref 136–145)
Total Bilirubin: 0.4 mg/dL (ref 0.20–1.20)
Total Protein: 6.7 g/dL (ref 6.4–8.3)

## 2016-12-01 LAB — UA PROTEIN, DIPSTICK - CHCC: PROTEIN: NEGATIVE mg/dL

## 2016-12-01 MED ORDER — PALONOSETRON HCL INJECTION 0.25 MG/5ML
INTRAVENOUS | Status: AC
  Filled 2016-12-01: qty 5

## 2016-12-01 MED ORDER — SODIUM CHLORIDE 0.9 % IV SOLN
Freq: Once | INTRAVENOUS | Status: AC
  Administered 2016-12-01: 12:00:00 via INTRAVENOUS

## 2016-12-01 MED ORDER — FOSAPREPITANT DIMEGLUMINE INJECTION 150 MG
Freq: Once | INTRAVENOUS | Status: AC
  Administered 2016-12-01: 13:00:00 via INTRAVENOUS
  Filled 2016-12-01: qty 5

## 2016-12-01 MED ORDER — SODIUM CHLORIDE 0.9 % IV SOLN
15.3000 mg/kg | Freq: Once | INTRAVENOUS | Status: AC
  Administered 2016-12-01: 1600 mg via INTRAVENOUS
  Filled 2016-12-01: qty 64

## 2016-12-01 MED ORDER — CARBOPLATIN CHEMO INJECTION 600 MG/60ML
512.5000 mg | Freq: Once | INTRAVENOUS | Status: AC
  Administered 2016-12-01: 510 mg via INTRAVENOUS
  Filled 2016-12-01: qty 51

## 2016-12-01 MED ORDER — PALONOSETRON HCL INJECTION 0.25 MG/5ML
0.2500 mg | Freq: Once | INTRAVENOUS | Status: AC
  Administered 2016-12-01: 0.25 mg via INTRAVENOUS

## 2016-12-01 MED ORDER — SODIUM CHLORIDE 0.9 % IV SOLN
490.0000 mg/m2 | Freq: Once | INTRAVENOUS | Status: AC
  Administered 2016-12-01: 1100 mg via INTRAVENOUS
  Filled 2016-12-01: qty 40

## 2016-12-01 NOTE — Progress Notes (Signed)
Pt reports a pressure in his head prior to coming to infusion room. Pt states that he believes that he became overwhelmed and that it is better now. Pt educated to monitor and notify clinic if symptoms worsens, sodium 135. Diane RN aware, okay to proceed with treatment. Pt request that weeks that he only has lab appt that he have then done closer  To home. Diane RN aware.   Pt tolerated infusion well. Pt educated on discharge instructions and verbalizes understanding. Pt stable at discharge.

## 2016-12-01 NOTE — Patient Instructions (Signed)
[facility] Discharge Instructions for Patients Receiving Chemotherapy  Today you received the following chemotherapy agents: Avastin, alimta and Carboplatin   To help prevent nausea and vomiting after your treatment, we encourage you to take your nausea medication as directed.  If you develop nausea and vomiting that is not controlled by your nausea medication, call the clinic.   BELOW ARE SYMPTOMS THAT SHOULD BE REPORTED IMMEDIATELY:  *FEVER GREATER THAN 100.5 F  *CHILLS WITH OR WITHOUT FEVER  NAUSEA AND VOMITING THAT IS NOT CONTROLLED WITH YOUR NAUSEA MEDICATION  *UNUSUAL SHORTNESS OF BREATH  *UNUSUAL BRUISING OR BLEEDING  TENDERNESS IN MOUTH AND THROAT WITH OR WITHOUT PRESENCE OF ULCERS  *URINARY PROBLEMS  *BOWEL PROBLEMS  UNUSUAL RASH Items with * indicate a potential emergency and should be followed up as soon as possible.  Feel free to call the clinic you have any questions or concerns. The clinic phone number is [phone].  Please show the [place] at check-in to the Emergency Department and triage nurse.   Bevacizumab injection What is this medicine? BEVACIZUMAB (be va SIZ yoo mab) is a monoclonal antibody. It is used to treat cervical cancer, colorectal cancer, glioblastoma multiforme, non-small cell lung cancer (NSCLC), ovarian cancer, and renal cell cancer. This medicine may be used for other purposes; ask your health care provider or pharmacist if you have questions. COMMON BRAND NAME(S): Avastin What should I tell my health care provider before I take this medicine? They need to know if you have any of these conditions: -blood clots -heart disease, including heart failure, heart attack, or chest pain (angina) -high blood pressure -infection (especially a virus infection such as chickenpox, cold sores, or herpes) -kidney disease -lung disease -prior chemotherapy with doxorubicin, daunorubicin, epirubicin, or other anthracycline  type chemotherapy agents -recent or ongoing radiation therapy -recent surgery -stroke -an unusual or allergic reaction to bevacizumab, hamster proteins, mouse proteins, other medicines, foods, dyes, or preservatives -pregnant or trying to get pregnant -breast-feeding How should I use this medicine? This medicine is for infusion into a vein. It is given by a health care professional in a hospital or clinic setting. Talk to your pediatrician regarding the use of this medicine in children. Special care may be needed. Overdosage: If you think you have taken too much of this medicine contact a poison control center or emergency room at once. NOTE: This medicine is only for you. Do not share this medicine with others. What if I miss a dose? It is important not to miss your dose. Call your doctor or health care professional if you are unable to keep an appointment. What may interact with this medicine? Interactions are not expected. This list may not describe all possible interactions. Give your health care provider a list of all the medicines, herbs, non-prescription drugs, or dietary supplements you use. Also tell them if you smoke, drink alcohol, or use illegal drugs. Some items may interact with your medicine. What should I watch for while using this medicine? Your condition will be monitored carefully while you are receiving this medicine. You will need important blood work and urine testing done while you are taking this medicine. During your treatment, let your health care professional know if you have any unusual symptoms, such as difficulty breathing. This medicine may rarely cause 'gastrointestinal perforation' (holes in the stomach, intestines or colon), a serious side effect requiring surgery to repair. This medicine should be started at least 28 days following major surgery and the  site of the surgery should be totally healed. Check with your doctor before scheduling dental work or surgery  while you are receiving this treatment. Talk to your doctor if you have recently had surgery or if you have a wound that has not healed. Do not become pregnant while taking this medicine or for 6 months after stopping it. Women should inform their doctor if they wish to become pregnant or think they might be pregnant. There is a potential for serious side effects to an unborn child. Talk to your health care professional or pharmacist for more information. Do not breast-feed an infant while taking this medicine. This medicine has caused ovarian failure in some women. This medicine may interfere with the ability to have a child. You should talk to your doctor or health care professional if you are concerned about your fertility. What side effects may I notice from receiving this medicine? Side effects that you should report to your doctor or health care professional as soon as possible: -allergic reactions like skin rash, itching or hives, swelling of the face, lips, or tongue -breathing problems -changes in vision -chest pain -confusion -jaw pain, especially after dental work -mouth sores -seizures -severe abdominal pain -severe headache -signs of decreased platelets or bleeding - bruising, pinpoint red spots on the skin, black, tarry stools, nosebleeds, blood in the urine -signs of infection - fever or chills, cough, sore throat, pain or trouble passing urine -sudden numbness or weakness of the face, arm or leg -swelling of legs or ankles -symptoms of a stroke: change in mental awareness, inability to talk or move one side of the body (especially in patients with lung cancer) -trouble passing urine or change in the amount of urine -trouble speaking or understanding -trouble walking, dizziness, loss of balance or coordination Side effects that usually do not require medical attention (report to your doctor or health care professional if they continue or are  bothersome): -constipation -diarrhea -dry skin -headache -loss of appetite -nausea, vomiting This list may not describe all possible side effects. Call your doctor for medical advice about side effects. You may report side effects to FDA at 1-800-FDA-1088. Where should I keep my medicine? This drug is given in a hospital or clinic and will not be stored at home. NOTE: This sheet is a summary. It may not cover all possible information. If you have questions about this medicine, talk to your doctor, pharmacist, or health care provider.  2017 [employer]/Gold Standard (2015-11-06 15:28:53)   Pemetrexed injection What is this medicine? PEMETREXED (PEM e TREX ed) is a chemotherapy drug. This medicine affects cells that are rapidly growing, such as cancer cells and cells in your mouth and stomach. It is usually used to treat lung cancers like non-small cell lung cancer and mesothelioma. It may also be used to treat other cancers. This medicine may be used for other purposes; ask your health care provider or pharmacist if you have questions. COMMON BRAND NAME(S): Alimta What should I tell my health care provider before I take this medicine? They need to know if you have any of these conditions: -if you frequently drink alcohol containing beverages -infection (especially a virus infection such as chickenpox, cold sores, or herpes) -kidney disease -liver disease -low blood counts, like low platelets, red bloods, or white blood cells -an unusual or allergic reaction to pemetrexed, mannitol, other medicines, foods, dyes, or preservatives -pregnant or trying to get pregnant -breast-feeding How should I use this medicine? This drug is given as  an infusion into a vein. It is administered in a hospital or clinic by a specially trained health care professional. Talk to your pediatrician regarding the use of this medicine in children. Special care may be needed. Overdosage: If you think you have taken  too much of this medicine contact a poison control center or emergency room at once. NOTE: This medicine is only for you. Do not share this medicine with others. What if I miss a dose? It is important not to miss your dose. Call your doctor or health care professional if you are unable to keep an appointment. What may interact with this medicine? -aspirin and aspirin-like medicines -medicines to increase blood counts like filgrastim, pegfilgrastim, sargramostim -methotrexate -NSAIDS, medicines for pain and inflammation, like ibuprofen or naproxen -probenecid -pyrimethamine -vaccines Talk to your doctor or health care professional before taking any of these medicines: -acetaminophen -aspirin -ibuprofen -ketoprofen -naproxen This list may not describe all possible interactions. Give your health care provider a list of all the medicines, herbs, non-prescription drugs, or dietary supplements you use. Also tell them if you smoke, drink alcohol, or use illegal drugs. Some items may interact with your medicine. What should I watch for while using this medicine? Visit your doctor for checks on your progress. This drug may make you feel generally unwell. This is not uncommon, as chemotherapy can affect healthy cells as well as cancer cells. Report any side effects. Continue your course of treatment even though you feel ill unless your doctor tells you to stop. In some cases, you may be given additional medicines to help with side effects. Follow all directions for their use. Call your doctor or health care professional for advice if you get a fever, chills or sore throat, or other symptoms of a cold or flu. Do not treat yourself. This drug decreases your body's ability to fight infections. Try to avoid being around people who are sick. This medicine may increase your risk to bruise or bleed. Call your doctor or health care professional if you notice any unusual bleeding. Be careful brushing and  flossing your teeth or using a toothpick because you may get an infection or bleed more easily. If you have any dental work done, tell your dentist you are receiving this medicine. Avoid taking products that contain aspirin, acetaminophen, ibuprofen, naproxen, or ketoprofen unless instructed by your doctor. These medicines may hide a fever. Call your doctor or health care professional if you get diarrhea or mouth sores. Do not treat yourself. To protect your kidneys, drink water or other fluids as directed while you are taking this medicine. Men and women must use effective birth control while taking this medicine. You may also need to continue using effective birth control for a time after stopping this medicine. Do not become pregnant while taking this medicine. Tell your doctor right away if you think that you or your partner might be pregnant. There is a potential for serious side effects to an unborn child. Talk to your health care professional or pharmacist for more information. Do not breast-feed an infant while taking this medicine. This medicine may lower sperm counts. What side effects may I notice from receiving this medicine? Side effects that you should report to your doctor or health care professional as soon as possible: -allergic reactions like skin rash, itching or hives, swelling of the face, lips, or tongue -low blood counts - this medicine may decrease the number of white blood cells, red blood cells and platelets. You  may be at increased risk for infections and bleeding. -signs of infection - fever or chills, cough, sore throat, pain or difficulty passing urine -signs of decreased platelets or bleeding - bruising, pinpoint red spots on the skin, black, tarry stools, blood in the urine -signs of decreased red blood cells - unusually weak or tired, fainting spells, lightheadedness -breathing problems, like a dry cough -changes in emotions or moods -chest  pain -confusion -diarrhea -high blood pressure -mouth or throat sores or ulcers -pain, swelling, warmth in the leg -pain on swallowing -swelling of the ankles, feet, hands -trouble passing urine or change in the amount of urine -vomiting -yellowing of the eyes or skin Side effects that usually do not require medical attention (report to your doctor or health care professional if they continue or are bothersome): -hair loss -loss of appetite -nausea -stomach upset This list may not describe all possible side effects. Call your doctor for medical advice about side effects. You may report side effects to FDA at 1-800-FDA-1088. Where should I keep my medicine? This drug is given in a hospital or clinic and will not be stored at home. NOTE: This sheet is a summary. It may not cover all possible information. If you have questions about this medicine, talk to your doctor, pharmacist, or health care provider.  2017 Elsevier/Gold Standard (2008-06-17 13:24:03)  Carboplatin injection What is this medicine? CARBOPLATIN (KAR boe pla tin) is a chemotherapy drug. It targets fast dividing cells, like cancer cells, and causes these cells to die. This medicine is used to treat ovarian cancer and many other cancers. This medicine may be used for other purposes; ask your health care provider or pharmacist if you have questions. COMMON BRAND NAME(S): Paraplatin What should I tell my health care provider before I take this medicine? They need to know if you have any of these conditions: -blood disorders -hearing problems -kidney disease -recent or ongoing radiation therapy -an unusual or allergic reaction to carboplatin, cisplatin, other chemotherapy, other medicines, foods, dyes, or preservatives -pregnant or trying to get pregnant -breast-feeding How should I use this medicine? This drug is usually given as an infusion into a vein. It is administered in a hospital or clinic by a specially trained  health care professional. Talk to your pediatrician regarding the use of this medicine in children. Special care may be needed. Overdosage: If you think you have taken too much of this medicine contact a poison control center or emergency room at once. NOTE: This medicine is only for you. Do not share this medicine with others. What if I miss a dose? It is important not to miss a dose. Call your doctor or health care professional if you are unable to keep an appointment. What may interact with this medicine? -medicines for seizures -medicines to increase blood counts like filgrastim, pegfilgrastim, sargramostim -some antibiotics like amikacin, gentamicin, neomycin, streptomycin, tobramycin -vaccines Talk to your doctor or health care professional before taking any of these medicines: -acetaminophen -aspirin -ibuprofen -ketoprofen -naproxen This list may not describe all possible interactions. Give your health care provider a list of all the medicines, herbs, non-prescription drugs, or dietary supplements you use. Also tell them if you smoke, drink alcohol, or use illegal drugs. Some items may interact with your medicine. What should I watch for while using this medicine? Your condition will be monitored carefully while you are receiving this medicine. You will need important blood work done while you are taking this medicine. This drug may make you  feel generally unwell. This is not uncommon, as chemotherapy can affect healthy cells as well as cancer cells. Report any side effects. Continue your course of treatment even though you feel ill unless your doctor tells you to stop. In some cases, you may be given additional medicines to help with side effects. Follow all directions for their use. Call your doctor or health care professional for advice if you get a fever, chills or sore throat, or other symptoms of a cold or flu. Do not treat yourself. This drug decreases your body's ability to fight  infections. Try to avoid being around people who are sick. This medicine may increase your risk to bruise or bleed. Call your doctor or health care professional if you notice any unusual bleeding. Be careful brushing and flossing your teeth or using a toothpick because you may get an infection or bleed more easily. If you have any dental work done, tell your dentist you are receiving this medicine. Avoid taking products that contain aspirin, acetaminophen, ibuprofen, naproxen, or ketoprofen unless instructed by your doctor. These medicines may hide a fever. Do not become pregnant while taking this medicine. Women should inform their doctor if they wish to become pregnant or think they might be pregnant. There is a potential for serious side effects to an unborn child. Talk to your health care professional or pharmacist for more information. Do not breast-feed an infant while taking this medicine. What side effects may I notice from receiving this medicine? Side effects that you should report to your doctor or health care professional as soon as possible: -allergic reactions like skin rash, itching or hives, swelling of the face, lips, or tongue -signs of infection - fever or chills, cough, sore throat, pain or difficulty passing urine -signs of decreased platelets or bleeding - bruising, pinpoint red spots on the skin, black, tarry stools, nosebleeds -signs of decreased red blood cells - unusually weak or tired, fainting spells, lightheadedness -breathing problems -changes in hearing -changes in vision -chest pain -high blood pressure -low blood counts - This drug may decrease the number of white blood cells, red blood cells and platelets. You may be at increased risk for infections and bleeding. -nausea and vomiting -pain, swelling, redness or irritation at the injection site -pain, tingling, numbness in the hands or feet -problems with balance, talking, walking -trouble passing urine or change  in the amount of urine Side effects that usually do not require medical attention (report to your doctor or health care professional if they continue or are bothersome): -hair loss -loss of appetite -metallic taste in the mouth or changes in taste This list may not describe all possible side effects. Call your doctor for medical advice about side effects. You may report side effects to FDA at 1-800-FDA-1088. Where should I keep my medicine? This drug is given in a hospital or clinic and will not be stored at home. NOTE: This sheet is a summary. It may not cover all possible information. If you have questions about this medicine, talk to your doctor, pharmacist, or health care provider.  2017 [employer]/Gold Standard (2008-02-19 14:38:05)

## 2016-12-07 ENCOUNTER — Telehealth: Payer: Self-pay | Admitting: Internal Medicine

## 2016-12-07 ENCOUNTER — Encounter: Payer: Self-pay | Admitting: Internal Medicine

## 2016-12-07 ENCOUNTER — Telehealth: Payer: Self-pay | Admitting: *Deleted

## 2016-12-07 ENCOUNTER — Other Ambulatory Visit: Payer: Medicare Other

## 2016-12-07 ENCOUNTER — Ambulatory Visit (HOSPITAL_BASED_OUTPATIENT_CLINIC_OR_DEPARTMENT_OTHER): Payer: Medicare Other | Admitting: Internal Medicine

## 2016-12-07 ENCOUNTER — Other Ambulatory Visit (HOSPITAL_BASED_OUTPATIENT_CLINIC_OR_DEPARTMENT_OTHER): Payer: Medicare Other

## 2016-12-07 VITALS — BP 113/57 | HR 84 | Temp 97.8°F | Resp 18 | Ht 69.0 in | Wt 229.0 lb

## 2016-12-07 DIAGNOSIS — C3411 Malignant neoplasm of upper lobe, right bronchus or lung: Secondary | ICD-10-CM

## 2016-12-07 DIAGNOSIS — Z5111 Encounter for antineoplastic chemotherapy: Secondary | ICD-10-CM

## 2016-12-07 DIAGNOSIS — R530 Neoplastic (malignant) related fatigue: Secondary | ICD-10-CM | POA: Diagnosis not present

## 2016-12-07 DIAGNOSIS — M7989 Other specified soft tissue disorders: Secondary | ICD-10-CM | POA: Diagnosis not present

## 2016-12-07 DIAGNOSIS — C7931 Secondary malignant neoplasm of brain: Secondary | ICD-10-CM

## 2016-12-07 HISTORY — DX: Encounter for antineoplastic chemotherapy: Z51.11

## 2016-12-07 LAB — CBC WITH DIFFERENTIAL/PLATELET
BASO%: 0.4 % (ref 0.0–2.0)
Basophils Absolute: 0 10*3/uL (ref 0.0–0.1)
EOS ABS: 0.1 10*3/uL (ref 0.0–0.5)
EOS%: 2.1 % (ref 0.0–7.0)
HCT: 35.2 % — ABNORMAL LOW (ref 38.4–49.9)
HEMOGLOBIN: 11.6 g/dL — AB (ref 13.0–17.1)
LYMPH%: 17.5 % (ref 14.0–49.0)
MCH: 24.9 pg — ABNORMAL LOW (ref 27.2–33.4)
MCHC: 32.8 g/dL (ref 32.0–36.0)
MCV: 75.8 fL — AB (ref 79.3–98.0)
MONO#: 0 10*3/uL — ABNORMAL LOW (ref 0.1–0.9)
MONO%: 1.2 % (ref 0.0–14.0)
NEUT%: 78.8 % — ABNORMAL HIGH (ref 39.0–75.0)
NEUTROS ABS: 3.2 10*3/uL (ref 1.5–6.5)
Platelets: 185 10*3/uL (ref 140–400)
RBC: 4.65 10*6/uL (ref 4.20–5.82)
RDW: 17 % — AB (ref 11.0–14.6)
WBC: 4.1 10*3/uL (ref 4.0–10.3)
lymph#: 0.7 10*3/uL — ABNORMAL LOW (ref 0.9–3.3)

## 2016-12-07 LAB — COMPREHENSIVE METABOLIC PANEL
ALBUMIN: 2.8 g/dL — AB (ref 3.5–5.0)
ALT: 13 U/L (ref 0–55)
AST: 14 U/L (ref 5–34)
Alkaline Phosphatase: 60 U/L (ref 40–150)
Anion Gap: 9 mEq/L (ref 3–11)
BILIRUBIN TOTAL: 0.51 mg/dL (ref 0.20–1.20)
BUN: 27.1 mg/dL — AB (ref 7.0–26.0)
CO2: 28 mEq/L (ref 22–29)
CREATININE: 0.8 mg/dL (ref 0.7–1.3)
Calcium: 9.9 mg/dL (ref 8.4–10.4)
Chloride: 99 mEq/L (ref 98–109)
EGFR: 82 mL/min/{1.73_m2} — ABNORMAL LOW (ref >90)
GLUCOSE: 92 mg/dL (ref 70–140)
Potassium: 4.3 mEq/L (ref 3.5–5.1)
SODIUM: 135 meq/L — AB (ref 136–145)
TOTAL PROTEIN: 6 g/dL — AB (ref 6.4–8.3)

## 2016-12-07 NOTE — Progress Notes (Signed)
[place] [phone]   [phone]  OFFICE PROGRESS NOTE  Purvis Kilts, MD [address] [place] [address]  DIAGNOSIS: Stage IV (T3, N2, M1b) poorly differentiated adenocarcinoma diagnosed in November 2017. He was initially diagnosed with a stage IA non-small cell lung cancer, moderately differentiated adenocarcinoma in April 2008. PDL 1 expression 0%.  MOLECULAR STUDIES: Genomic Alterations Identified? BRCA2 R2494* AKT2 amplification - equivocal? TP53 [id number]*35 Additional Findings? Microsatellite status MS-Stable Tumor Mutation Burden TMB-Intermediate; 12 Muts/Mb Additional Disease-relevant Genes with No Reportable Alterations Identified? EGFR KRAS ALK BRAF MET RET ERBB2 ROS1  PRIOR THERAPY: 1) status post wedge resection of the right upper lobe with seed implants under the care of Dr. BArlyce Diceon 10/13/2007. 2) palliative radiotherapy to the large right upper lobe lung mass under the care of Dr. MTammi Klippel 3) stereotactic radiotherapy to a solitary left thalamic brain metastasis on 11/17/2016.  CURRENT THERAPY: Systemic chemotherapy with carboplatin for AUC of 5, Alimta 500 MG/M2 and Avastin 15 MG/KG every 3 weeks. First dose 08/30/2016. Status post one cycle.  INTERVAL HISTORY: Andrew [id number]. male came to the clinic today accompanied by his friend [name] The patient related the first cycle of his treatment well except for fatigue started few days after the chemotherapy. He also has some urinary incontinence and he is currently on Lasix 40 mg by mouth daily. He is also on Ditropan by his urologist. He had 2 episodes of diarrhea resolved with Imodium. The patient also has occasional dizzy spells. He denied having any chest pain but continues to have shortness breath with exertion with no cough or hemoptysis. He is here today for evaluation and repeat blood work.   MEDICAL HISTORY: Past Medical History:    Diagnosis Date  . Adenocarcinoma of right lung, stage 4 ([facility]13[place]  . Brain [place] ([facility] 11/03/2016  . Colon polyps   . Enlarged prostate   . Essential hypertension   . Goals of care, counseling/discussion 11/17/2016  . History of kidney stones   . Hypercholesteremia   . [place] [place]   Stage IA non-small cell lung cancer, moderately differentiated adenocarcinoma - VATS and seed implants  . Shortness of breath at rest 10/13/2016  . Thoracic aortic aneurysm (HCC)    4.2 cm in AP diameter   . Type 2 diabetes mellitus (HCC)     ALLERGIES:  is allergic to sulfa antibiotics.  MEDICATIONS:  Current Outpatient Prescriptions  Medication Sig Dispense Refill  . albuterol (PROVENTIL HFA;VENTOLIN HFA) 108 (90 Base) MCG/ACT inhaler Inhale 2 puffs into the lungs every 6 (six) hours as needed for wheezing or shortness of breath. 1 Inhaler 2  . alfuzosin (UROXATRAL) 10 MG 24 hr tablet Take 10 mg by mouth daily.      .Marland Kitchenapixaban (ELIQUIS) 5 MG TABS tablet Take 1 tablet (5 mg total) by mouth 2 (two) times daily. 60 tablet 1  . folic acid (FOLVITE) 1 MG tablet Take 1 tablet (1 mg total) by mouth daily. 30 tablet 4  . furosemide (LASIX) 40 MG tablet Take 1 tablet (40 mg total) by mouth daily. 90 tablet 3  . glipiZIDE (GLUCOTROL) 5 MG tablet Take 5 mg by mouth 2 (two) times daily before a meal.    . JANUMET 50-1000 MG tablet 1 tablet 2 (two) times daily.    .Marland Kitchenlosartan (COZAAR) 100 MG tablet Take 100 mg by mouth daily.    . metoprolol (LOPRESSOR) 50 MG  tablet Take 1 tablet (50 mg total) by mouth 2 (two) times daily. 60 tablet 0  . Omega-3 Fatty Acids (FISH OIL) 1000 MG CAPS Take by mouth.    . oxybutynin (DITROPAN-XL) 10 MG 24 hr tablet Take 10 mg by mouth at bedtime.    . potassium chloride (K-DUR) 10 MEQ tablet Take 1 tablet (10 mEq total) by mouth daily. 90 tablet 3  . simvastatin (ZOCOR) 40 MG tablet Take 40 mg by mouth at bedtime.      . Wound Cleansers (RADIAPLEX EX)  Apply topically.    Marland Kitchen dexamethasone (DECADRON) 4 MG tablet 4 mg by mouth twice a day the day before, day of and day after the chemotherapy every 3 weeks (Patient not taking: Reported on 12/07/2016) 40 tablet 1  . folic acid (FOLVITE) 098 MCG tablet Take 800 mcg by mouth daily.    Marland Kitchen LORazepam (ATIVAN) 1 MG tablet Take 0.5 tablets (0.5 mg total) by mouth every 8 (eight) hours as needed for anxiety (30 min before MRI). (Patient not taking: Reported on 12/07/2016) 15 tablet 0  . prochlorperazine (COMPAZINE) 10 MG tablet Take 1 tablet (10 mg total) by mouth every 6 (six) hours as needed for nausea or vomiting. (Patient not taking: Reported on 12/07/2016) 30 tablet 0   No current facility-administered medications for this visit.     SURGICAL HISTORY:  Past Surgical History:  Procedure Laterality Date  . APPENDECTOMY    . CERVICAL SPINE SURGERY    . CHOLECYSTECTOMY    . COLONOSCOPY  04/17/2012   Procedure: COLONOSCOPY;  Surgeon: Jamesetta So, MD;  Location: [facility];  Service: Gastroenterology;  Laterality: N/A;  . COLONOSCOPY W/ POLYPECTOMY    . TONSILLECTOMY    . wedge resection with seed implantation and node sampling with right VATS  03/13/2007    REVIEW OF SYSTEMS:  A comprehensive review of systems was negative except for: Constitutional: positive for fatigue Gastrointestinal: positive for diarrhea Neurological: positive for dizziness   PHYSICAL EXAMINATION: General appearance: alert, cooperative, fatigued and no distress Head: Normocephalic, without obvious abnormality, atraumatic Neck: no adenopathy, no JVD, supple, symmetrical, trachea midline and thyroid not enlarged, symmetric, no tenderness/mass/nodules Lymph nodes: Cervical, supraclavicular, and axillary nodes normal. Resp: dullness to percussion RLL, rubs bilaterally and wheezes bilaterally Back: symmetric, no curvature. ROM normal. No CVA tenderness. Cardio: regular rate and rhythm, S1, S2 normal, no murmur, click, rub or  gallop GI: soft, non-tender; bowel sounds normal; no masses,  no organomegaly Extremities: extremities normal, atraumatic, no cyanosis or edema  ECOG PERFORMANCE STATUS: 1 - Symptomatic but completely ambulatory  Blood pressure (!) 113/57, pulse 84, temperature 97.8 F (36.6 C), temperature source Oral, resp. rate 18, height 5' 9"  (1.753 m), weight 229 lb (103.9 kg), SpO2 98 %.  LABORATORY DATA: Lab Results  Component Value Date   WBC 4.1 12/07/2016   HGB 11.6 (L) 12/07/2016   HCT 35.2 (L) 12/07/2016   MCV 75.8 (L) 12/07/2016   PLT 185 12/07/2016      Chemistry      Component Value Date/Time   NA 135 (L) 12/01/2016 1100   K 4.3 12/01/2016 1100   CL 101 10/18/2016 1150   CL 104 10/30/2012 0805   CO2 23 12/01/2016 1100   BUN 27.5 (H) 12/01/2016 1100   CREATININE 0.8 12/01/2016 1100      Component Value Date/Time   CALCIUM 10.8 (H) 12/01/2016 1100   ALKPHOS 65 12/01/2016 1100   AST 10 12/01/2016 1100  ALT 10 12/01/2016 1100   BILITOT 0.40 12/01/2016 1100       RADIOGRAPHIC STUDIES: Mr Jeri Cos QU Contrast  Result Date: 11/14/2016 CLINICAL DATA:  83 year old male with lung cancer. SRS planning exam. Subsequent encounter. EXAM: MRI HEAD WITHOUT AND WITH CONTRAST TECHNIQUE: Multiplanar, multiecho pulse sequences of the brain and surrounding structures were obtained without and with intravenous contrast. CONTRAST:  59m MULTIHANCE GADOBENATE DIMEGLUMINE 529 MG/ML IV SOLN COMPARISON:  10/31/2016 MR. FINDINGS: Brain: 8 mm ring-enhancing lesion posterior aspect left thalamus consistent with solitary metastatic lesion. Areas of enhancement surrounding the ventricles have an appearance most suggestive of vessels rather than metastatic disease. No acute infarct or intracranial hemorrhage. Mild chronic microvascular changes. Global atrophy without hydrocephalus. Vascular: Major intracranial vascular structures are patent. Skull and upper cervical spine: Mild transverse ligament  hypertrophy. Cervical spondylotic changes C4-5 and C5-6 with slight cord flattening C5-6 level. Sinuses/Orbits: Post lens replacement without acute orbital abnormality. Left maxillary sinus mucosal thickening most notable inferiorly measuring up to 8 mm. Mild ethmoid sinus mucosal thickening greater on the left. Other: Negative IMPRESSION: 8 mm ring-enhancing lesion posterior aspect left thalamus consistent with solitary metastatic lesion. Areas of enhancement surrounding the ventricles have an appearance most suggestive of vessels rather than metastatic disease. Mild chronic microvascular changes. Global atrophy without hydrocephalus. Cervical spondylotic changes C4-5 and C5-6 with slight cord flattening C5-6 level. Left maxillary sinus mucosal thickening most notable inferiorly measuring up to 8 mm. Mild ethmoid sinus mucosal thickening greater on the left. Electronically Signed   By: SGenia DelM.D.   On: 11/14/2016 15:57    ASSESSMENT AND PLAN:  This is a very pleasant 83years old white male with metastatic non-small cell lung cancer, adenocarcinoma with solitary brain metastasis. He is status post stereotactic radiotherapy to the solitary brain lesion. The patient is currently undergoing systemic chemotherapy with carboplatin, Alimta and Avastin status post 1 cycle. He tolerated the first week of his treatment well except for fatigue. I recommended for him to continue with the weekly blood work. I will see him back for follow-up visit in 2 weeks for evaluation before starting cycle #2 of his treatment. For the swelling of the lower extremities, the patient will continue his current treatment with Lasix. He was advised to call immediately if he has any concerning symptoms in the interval.  The patient voices understanding of current disease status and treatment options and is in agreement with the current care plan.  All questions were answered. The patient knows to call the clinic with any  problems, questions or concerns. We can certainly see the patient much sooner if necessary. I spent 10 minutes counseling the patient face to face. The total time spent in the appointment was 15 minutes.  Disclaimer: This note was dictated with voice recognition software. Similar sounding words can inadvertently be transcribed and may not be corrected upon review.

## 2016-12-07 NOTE — Telephone Encounter (Signed)
"  [name] thought he was only to receive three chemotherapy treatments  Today he was scheduled more and we'd like to know why?"  This chemotherapy regimen is given every three weeks for a total of six times.  Chemotherapy regimens sometimes may change is why our office only scheduled through today's F/U visit.   No further questions at this time.

## 2016-12-07 NOTE — Telephone Encounter (Signed)
Gave patient avs report and appointments for January thru March. Weekly labs at [facility] cxd patient will have weekly labs at [facility] and has standing order. Patient aware.

## 2016-12-13 DIAGNOSIS — E1140 Type 2 diabetes mellitus with diabetic neuropathy, unspecified: Secondary | ICD-10-CM | POA: Diagnosis not present

## 2016-12-13 DIAGNOSIS — E1151 Type 2 diabetes mellitus with diabetic peripheral angiopathy without gangrene: Secondary | ICD-10-CM | POA: Diagnosis not present

## 2016-12-14 ENCOUNTER — Other Ambulatory Visit: Payer: Medicare Other

## 2016-12-14 ENCOUNTER — Telehealth: Payer: Self-pay | Admitting: Internal Medicine

## 2016-12-14 NOTE — Telephone Encounter (Signed)
Patient called and said he was supposed to go to [facility] for labs and he is snowed in and can not get out .   He needs to know can he got tomorrow and does he need another order  Call back number is [phone]

## 2016-12-15 ENCOUNTER — Encounter (HOSPITAL_COMMUNITY)
Admission: RE | Admit: 2016-12-15 | Discharge: 2016-12-15 | Disposition: A | Discharge status: Home / Self Care | Payer: Medicare Other | Source: Ambulatory Visit | Attending: Internal Medicine | Admitting: Internal Medicine

## 2016-12-15 DIAGNOSIS — Z9221 Personal history of antineoplastic chemotherapy: Secondary | ICD-10-CM | POA: Insufficient documentation

## 2016-12-15 DIAGNOSIS — Z79899 Other long term (current) drug therapy: Secondary | ICD-10-CM | POA: Diagnosis not present

## 2016-12-15 LAB — CBC WITH DIFFERENTIAL/PLATELET
BASOS ABS: 0 10*3/uL (ref 0.0–0.1)
Basophils Relative: 1 %
EOS PCT: 2 %
Eosinophils Absolute: 0.1 10*3/uL (ref 0.0–0.7)
HCT: 36.3 % — ABNORMAL LOW (ref 39.0–52.0)
Hemoglobin: 11.4 g/dL — ABNORMAL LOW (ref 13.0–17.0)
Lymphocytes Relative: 32 %
Lymphs Abs: 0.9 10*3/uL (ref 0.7–4.0)
MCH: 24.7 pg — AB (ref 26.0–34.0)
MCHC: 31.4 g/dL (ref 30.0–36.0)
MCV: 78.6 fL (ref 78.0–100.0)
Monocytes Absolute: 0.5 10*3/uL (ref 0.1–1.0)
Monocytes Relative: 18 %
Neutro Abs: 1.4 10*3/uL — ABNORMAL LOW (ref 1.7–7.7)
Neutrophils Relative %: 48 %
PLATELETS: 101 10*3/uL — AB (ref 150–400)
RBC: 4.62 MIL/uL (ref 4.22–5.81)
RDW: 18.4 % — ABNORMAL HIGH (ref 11.5–15.5)
WBC: 2.9 10*3/uL — AB (ref 4.0–10.5)

## 2016-12-15 LAB — COMPREHENSIVE METABOLIC PANEL
ALT: 21 U/L (ref 17–63)
AST: 21 U/L (ref 15–41)
Albumin: 3.5 g/dL (ref 3.5–5.0)
Alkaline Phosphatase: 62 U/L (ref 38–126)
Anion gap: 10 (ref 5–15)
BILIRUBIN TOTAL: 0.5 mg/dL (ref 0.3–1.2)
BUN: 18 mg/dL (ref 6–20)
CO2: 26 mmol/L (ref 22–32)
CREATININE: 0.69 mg/dL (ref 0.61–1.24)
Calcium: 9.9 mg/dL (ref 8.9–10.3)
Chloride: 100 mmol/L — ABNORMAL LOW (ref 101–111)
GFR calc Af Amer: >60 mL/min (ref >60)
Glucose, Bld: 144 mg/dL — ABNORMAL HIGH (ref 65–99)
Potassium: 4 mmol/L (ref 3.5–5.1)
Sodium: 136 mmol/L (ref 135–145)
Total Protein: 7 g/dL (ref 6.5–8.1)

## 2016-12-18 ENCOUNTER — Encounter (HOSPITAL_COMMUNITY): Payer: Self-pay

## 2016-12-18 ENCOUNTER — Emergency Department (HOSPITAL_COMMUNITY): Payer: Medicare Other

## 2016-12-18 ENCOUNTER — Inpatient Hospital Stay (HOSPITAL_COMMUNITY)
Admission: EM | Admit: 2016-12-18 | Discharge: 2016-12-22 | DRG: 082 | Disposition: A | Discharge status: Home / Self Care | Payer: Medicare Other | Attending: Internal Medicine | Admitting: Internal Medicine

## 2016-12-18 DIAGNOSIS — A419 Sepsis, unspecified organism: Secondary | ICD-10-CM

## 2016-12-18 DIAGNOSIS — Z23 Encounter for immunization: Secondary | ICD-10-CM | POA: Diagnosis not present

## 2016-12-18 DIAGNOSIS — D701 Agranulocytosis secondary to cancer chemotherapy: Secondary | ICD-10-CM | POA: Diagnosis not present

## 2016-12-18 DIAGNOSIS — E7800 Pure hypercholesterolemia, unspecified: Secondary | ICD-10-CM | POA: Diagnosis present

## 2016-12-18 DIAGNOSIS — S066X0A Traumatic subarachnoid hemorrhage without loss of consciousness, initial encounter: Secondary | ICD-10-CM | POA: Diagnosis not present

## 2016-12-18 DIAGNOSIS — E876 Hypokalemia: Secondary | ICD-10-CM | POA: Diagnosis not present

## 2016-12-18 DIAGNOSIS — Z87891 Personal history of nicotine dependence: Secondary | ICD-10-CM

## 2016-12-18 DIAGNOSIS — S6992XA Unspecified injury of left wrist, hand and finger(s), initial encounter: Secondary | ICD-10-CM | POA: Diagnosis not present

## 2016-12-18 DIAGNOSIS — C7931 Secondary malignant neoplasm of brain: Secondary | ICD-10-CM | POA: Diagnosis not present

## 2016-12-18 DIAGNOSIS — R7989 Other specified abnormal findings of blood chemistry: Secondary | ICD-10-CM

## 2016-12-18 DIAGNOSIS — R6520 Severe sepsis without septic shock: Secondary | ICD-10-CM | POA: Diagnosis not present

## 2016-12-18 DIAGNOSIS — Y92009 Unspecified place in unspecified non-institutional (private) residence as the place of occurrence of the external cause: Secondary | ICD-10-CM | POA: Diagnosis not present

## 2016-12-18 DIAGNOSIS — I5022 Chronic systolic (congestive) heart failure: Secondary | ICD-10-CM | POA: Diagnosis not present

## 2016-12-18 DIAGNOSIS — I4891 Unspecified atrial fibrillation: Secondary | ICD-10-CM | POA: Diagnosis present

## 2016-12-18 DIAGNOSIS — W1830XA Fall on same level, unspecified, initial encounter: Secondary | ICD-10-CM | POA: Diagnosis not present

## 2016-12-18 DIAGNOSIS — I480 Paroxysmal atrial fibrillation: Secondary | ICD-10-CM | POA: Diagnosis present

## 2016-12-18 DIAGNOSIS — Z79899 Other long term (current) drug therapy: Secondary | ICD-10-CM | POA: Diagnosis not present

## 2016-12-18 DIAGNOSIS — S066X9A Traumatic subarachnoid hemorrhage with loss of consciousness of unspecified duration, initial encounter: Secondary | ICD-10-CM | POA: Diagnosis not present

## 2016-12-18 DIAGNOSIS — Z66 Do not resuscitate: Secondary | ICD-10-CM | POA: Diagnosis present

## 2016-12-18 DIAGNOSIS — F4489 Other dissociative and conversion disorders: Secondary | ICD-10-CM | POA: Diagnosis not present

## 2016-12-18 DIAGNOSIS — Z515 Encounter for palliative care: Secondary | ICD-10-CM

## 2016-12-18 DIAGNOSIS — R197 Diarrhea, unspecified: Secondary | ICD-10-CM | POA: Diagnosis present

## 2016-12-18 DIAGNOSIS — R55 Syncope and collapse: Secondary | ICD-10-CM | POA: Diagnosis not present

## 2016-12-18 DIAGNOSIS — Z923 Personal history of irradiation: Secondary | ICD-10-CM

## 2016-12-18 DIAGNOSIS — R740 Nonspecific elevation of levels of transaminase and lactic acid dehydrogenase [LDH]: Secondary | ICD-10-CM | POA: Diagnosis not present

## 2016-12-18 DIAGNOSIS — I609 Nontraumatic subarachnoid hemorrhage, unspecified: Secondary | ICD-10-CM

## 2016-12-18 DIAGNOSIS — E860 Dehydration: Secondary | ICD-10-CM | POA: Diagnosis present

## 2016-12-18 DIAGNOSIS — T451X5A Adverse effect of antineoplastic and immunosuppressive drugs, initial encounter: Secondary | ICD-10-CM | POA: Diagnosis not present

## 2016-12-18 DIAGNOSIS — C3491 Malignant neoplasm of unspecified part of right bronchus or lung: Secondary | ICD-10-CM | POA: Diagnosis present

## 2016-12-18 DIAGNOSIS — Z882 Allergy status to sulfonamides status: Secondary | ICD-10-CM

## 2016-12-18 DIAGNOSIS — I110 Hypertensive heart disease with heart failure: Secondary | ICD-10-CM | POA: Diagnosis present

## 2016-12-18 DIAGNOSIS — J111 Influenza due to unidentified influenza virus with other respiratory manifestations: Secondary | ICD-10-CM

## 2016-12-18 DIAGNOSIS — C3411 Malignant neoplasm of upper lobe, right bronchus or lung: Secondary | ICD-10-CM | POA: Diagnosis present

## 2016-12-18 DIAGNOSIS — J101 Influenza due to other identified influenza virus with other respiratory manifestations: Secondary | ICD-10-CM | POA: Diagnosis not present

## 2016-12-18 DIAGNOSIS — W19XXXA Unspecified fall, initial encounter: Secondary | ICD-10-CM

## 2016-12-18 DIAGNOSIS — E119 Type 2 diabetes mellitus without complications: Secondary | ICD-10-CM | POA: Diagnosis present

## 2016-12-18 DIAGNOSIS — R402411 Glasgow coma scale score 13-15, in the field [EMT or ambulance]: Secondary | ICD-10-CM | POA: Diagnosis not present

## 2016-12-18 DIAGNOSIS — R05 Cough: Secondary | ICD-10-CM

## 2016-12-18 DIAGNOSIS — S199XXA Unspecified injury of neck, initial encounter: Secondary | ICD-10-CM | POA: Diagnosis not present

## 2016-12-18 DIAGNOSIS — Z7984 Long term (current) use of oral hypoglycemic drugs: Secondary | ICD-10-CM

## 2016-12-18 DIAGNOSIS — Z7952 Long term (current) use of systemic steroids: Secondary | ICD-10-CM

## 2016-12-18 DIAGNOSIS — Z7901 Long term (current) use of anticoagulants: Secondary | ICD-10-CM | POA: Diagnosis not present

## 2016-12-18 DIAGNOSIS — I951 Orthostatic hypotension: Secondary | ICD-10-CM | POA: Diagnosis present

## 2016-12-18 DIAGNOSIS — R059 Cough, unspecified: Secondary | ICD-10-CM

## 2016-12-18 DIAGNOSIS — E872 Acidosis: Secondary | ICD-10-CM | POA: Diagnosis not present

## 2016-12-18 DIAGNOSIS — R6510 Systemic inflammatory response syndrome (SIRS) of non-infectious origin without acute organ dysfunction: Secondary | ICD-10-CM | POA: Diagnosis present

## 2016-12-18 LAB — CBC WITH DIFFERENTIAL/PLATELET
BASOS PCT: 0 %
Basophils Absolute: 0 10*3/uL (ref 0.0–0.1)
EOS ABS: 0 10*3/uL (ref 0.0–0.7)
EOS PCT: 1 %
HCT: 34.2 % — ABNORMAL LOW (ref 39.0–52.0)
Hemoglobin: 11.1 g/dL — ABNORMAL LOW (ref 13.0–17.0)
LYMPHS ABS: 0.3 10*3/uL — AB (ref 0.7–4.0)
Lymphocytes Relative: 19 %
MCH: 24.5 pg — AB (ref 26.0–34.0)
MCHC: 32.5 g/dL (ref 30.0–36.0)
MCV: 75.5 fL — ABNORMAL LOW (ref 78.0–100.0)
Monocytes Absolute: 0.5 10*3/uL (ref 0.1–1.0)
Monocytes Relative: 29 %
NEUTROS PCT: 51 %
Neutro Abs: 0.8 10*3/uL — ABNORMAL LOW (ref 1.7–7.7)
PLATELETS: 204 10*3/uL (ref 150–400)
RBC: 4.53 MIL/uL (ref 4.22–5.81)
RDW: 18.3 % — ABNORMAL HIGH (ref 11.5–15.5)
WBC: 1.6 10*3/uL — AB (ref 4.0–10.5)

## 2016-12-18 LAB — URINALYSIS, ROUTINE W REFLEX MICROSCOPIC
Bacteria, UA: NONE SEEN
Bilirubin Urine: NEGATIVE
Glucose, UA: NEGATIVE mg/dL
Ketones, ur: NEGATIVE mg/dL
NITRITE: NEGATIVE
PH: 5 (ref 5.0–8.0)
Protein, ur: 30 mg/dL — AB
SPECIFIC GRAVITY, URINE: 1.016 (ref 1.005–1.030)

## 2016-12-18 LAB — I-STAT TROPONIN, ED: Troponin i, poc: 0.1 ng/mL (ref 0.00–0.08)

## 2016-12-18 LAB — COMPREHENSIVE METABOLIC PANEL
ALK PHOS: 63 U/L (ref 38–126)
ALT: 30 U/L (ref 17–63)
AST: 52 U/L — AB (ref 15–41)
Albumin: 3.4 g/dL — ABNORMAL LOW (ref 3.5–5.0)
Anion gap: 11 (ref 5–15)
BILIRUBIN TOTAL: 0.6 mg/dL (ref 0.3–1.2)
BUN: 24 mg/dL — AB (ref 6–20)
CALCIUM: 9.6 mg/dL (ref 8.9–10.3)
CHLORIDE: 99 mmol/L — AB (ref 101–111)
CO2: 24 mmol/L (ref 22–32)
CREATININE: 0.76 mg/dL (ref 0.61–1.24)
Glucose, Bld: 83 mg/dL (ref 65–99)
Potassium: 3.8 mmol/L (ref 3.5–5.1)
Sodium: 134 mmol/L — ABNORMAL LOW (ref 135–145)
Total Protein: 6.6 g/dL (ref 6.5–8.1)

## 2016-12-18 LAB — I-STAT CG4 LACTIC ACID, ED: Lactic Acid, Venous: 3.46 mmol/L (ref 0.5–1.9)

## 2016-12-18 LAB — PROTIME-INR
INR: 1.29
Prothrombin Time: 16.2 seconds — ABNORMAL HIGH (ref 11.4–15.2)

## 2016-12-18 LAB — CK: CK TOTAL: 207 U/L (ref 49–397)

## 2016-12-18 LAB — MAGNESIUM: MAGNESIUM: 1.6 mg/dL — AB (ref 1.7–2.4)

## 2016-12-18 MED ORDER — HYDROGEN PEROXIDE 3 % EX SOLN
CUTANEOUS | Status: AC
  Filled 2016-12-18: qty 473

## 2016-12-18 MED ORDER — MAGNESIUM SULFATE 2 GM/50ML IV SOLN
2.0000 g | Freq: Once | INTRAVENOUS | Status: AC
  Administered 2016-12-18: 2 g via INTRAVENOUS
  Filled 2016-12-18: qty 50

## 2016-12-18 MED ORDER — TETANUS-DIPHTH-ACELL PERTUSSIS 5-2.5-18.5 LF-MCG/0.5 IM SUSP
0.5000 mL | Freq: Once | INTRAMUSCULAR | Status: AC
  Administered 2016-12-18: 0.5 mL via INTRAMUSCULAR
  Filled 2016-12-18: qty 0.5

## 2016-12-18 MED ORDER — SODIUM CHLORIDE 0.9 % IV BOLUS (SEPSIS)
1000.0000 mL | Freq: Once | INTRAVENOUS | Status: AC
Start: 2016-12-18 — End: 2016-12-18
  Administered 2016-12-18: 1000 mL via INTRAVENOUS

## 2016-12-18 MED ORDER — SODIUM CHLORIDE 0.9 % IV BOLUS (SEPSIS)
1000.0000 mL | Freq: Once | INTRAVENOUS | Status: AC
  Administered 2016-12-18: 1000 mL via INTRAVENOUS

## 2016-12-18 NOTE — ED Provider Notes (Addendum)
[facility] DEPT Provider Note   CSN: [id number] Arrival date & time: 12/18/16  1946     History   Chief Complaint Chief Complaint  Patient presents with  . Fall    HPI STEPHENSON CICHY is a 83 y.o. male.  HPI 83 year old male who presents after fall. He has history of metastatic lung and no carcinoma with brain metastases status post chemoradiation therapy, last chemotherapy 3 weeks ago. Also history of paroxysmal atrial fibrillation on apixaban, type 2 diabetes, thoracic aortic aneurysm, and hypertension. According to patient's neighbors who are very close to the patient to care for him, he has had decline since chemotherapy 3 weeks ago. Has had decreased appetite and decreased by mouth intake, extreme fatigue. Has had diarrhea but no nausea and vomiting. Has had minimal cough but no chest pain or dyspnea. No recent fevers or chills.  Patient last spoke on phone with daughter today at 2:30PM. He was then found by neighbors on the floor at 5:30PM. He does not remember what happened. Unsure if he had passed out.    Past Medical History:  Diagnosis Date  . Adenocarcinoma of right lung, stage 4 ([place]) 10/13/2016  . Brain metastasis ([place]) 11/03/2016  . Colon polyps   . Encounter for antineoplastic chemotherapy 12/07/2016  . Enlarged prostate   . Essential hypertension   . Goals of care, counseling/discussion 11/17/2016  . History of kidney stones   . Hypercholesteremia   . Lung cancer ([place]) Dx'd 01/2007   Stage IA non-small cell lung cancer, moderately differentiated adenocarcinoma - VATS and seed implants  . Shortness of breath at rest 10/13/2016  . Thoracic aortic aneurysm ([facility])    4.2 cm in AP diameter   . Type 2 diabetes mellitus [facility])     Patient Active Problem List   Diagnosis Date Noted  . SAH (subarachnoid hemorrhage) ([place]) 12/19/2016  . Encounter for antineoplastic chemotherapy 12/07/2016  . Bronchitis 11/23/2016  . Peripheral edema 11/23/2016  . Goals of care,  counseling/discussion 11/17/2016  . 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 Palliative care by specialist   . DNR (do not resuscitate)   . Malignant neoplasm of right lung ([place])   . Adenocarcinoma of right lung, stage 4 ([place]) 10/13/2016  . Shortness of breath at rest 10/13/2016  . Atrial fibrillation with rapid ventricular response ([place]) 08/25/2016  . Chest pain 08/25/2016  . DM type 2 (diabetes mellitus, type 2) ([place]) 08/25/2016  . Aortic atherosclerosis ([place]) 08/25/2016  . Thoracic aortic aneurysm ([place]) 08/25/2016  . Hx of cancer of lung 11/07/2011  . Hypercholesteremia   . History of kidney stones     Past Surgical History:  Procedure Laterality Date  . APPENDECTOMY    . CERVICAL SPINE SURGERY    . CHOLECYSTECTOMY    . COLONOSCOPY  04/17/2012   Procedure: COLONOSCOPY;  Surgeon: Jamesetta So, MD;  Location: [facility];  Service: Gastroenterology;  Laterality: N/A;  . COLONOSCOPY W/ POLYPECTOMY    . TONSILLECTOMY    . wedge resection with seed implantation and node sampling with right VATS  03/13/2007       Home Medications    Prior to Admission medications   Medication Sig Start Date End Date Taking? Authorizing Provider  albuterol (PROVENTIL HFA;VENTOLIN HFA) 108 (90 Base) MCG/ACT inhaler Inhale 2 puffs into the lungs every 6 (six) hours as needed for wheezing or shortness of breath. 10/13/16  Yes Curt Bears, MD  alfuzosin (UROXATRAL) 10 MG 24 hr  tablet Take 10 mg by mouth daily.     Yes Historical Provider, MD  apixaban (ELIQUIS) 5 MG TABS tablet Take 1 tablet (5 mg total) by mouth 2 (two) times daily. 08/26/16  Yes Samuella Cota, MD  dexamethasone (DECADRON) 4 MG tablet 4 mg by mouth twice a day the day before, day of and day after the chemotherapy every 3 weeks 11/17/16  Yes Curt Bears, MD  folic acid (FOLVITE) 1 MG tablet Take 1 tablet (1 mg total) by mouth daily. 11/17/16  Yes Curt Bears, MD  folic acid (FOLVITE) 378 MCG tablet  Take 800 mcg by mouth daily.   Yes Historical Provider, MD  furosemide (LASIX) 40 MG tablet Take 1 tablet (40 mg total) by mouth daily. 10/28/16 01/26/17 Yes Satira Sark, MD  glipiZIDE (GLUCOTROL) 5 MG tablet Take 5 mg by mouth 2 (two) times daily before a meal.   Yes Historical Provider, MD  JANUMET 50-1000 MG tablet 1 tablet 2 (two) times daily. 07/21/16  Yes Historical Provider, MD  LORazepam (ATIVAN) 1 MG tablet Take 0.5 tablets (0.5 mg total) by mouth every 8 (eight) hours as needed for anxiety (30 min before MRI). 11/08/16  Yes Tyler Pita, MD  losartan (COZAAR) 100 MG tablet Take 100 mg by mouth daily.   Yes Historical Provider, MD  metoprolol (LOPRESSOR) 50 MG tablet Take 1 tablet (50 mg total) by mouth 2 (two) times daily. 08/26/16  Yes Samuella Cota, MD  Omega-3 Fatty Acids (FISH OIL) 1000 MG CAPS Take by mouth.   Yes Historical Provider, MD  oxybutynin (DITROPAN-XL) 10 MG 24 hr tablet Take 10 mg by mouth at bedtime.   Yes Historical Provider, MD  potassium chloride (K-DUR) 10 MEQ tablet Take 1 tablet (10 mEq total) by mouth daily. 09/26/16 12/25/16 Yes Satira Sark, MD  simvastatin (ZOCOR) 40 MG tablet Take 40 mg by mouth at bedtime.     Yes Historical Provider, MD  Wound Cleansers (RADIAPLEX EX) Apply topically.   Yes Historical Provider, MD  prochlorperazine (COMPAZINE) 10 MG tablet Take 1 tablet (10 mg total) by mouth every 6 (six) hours as needed for nausea or vomiting. Patient not taking: Reported on 12/07/2016 11/17/16   Curt Bears, MD    Family History Family History  Problem Relation Age of Onset  . Colon cancer Neg Hx   . Cancer Neg Hx     Social History Social History  Substance Use Topics  . Smoking status: Former Smoker    Packs/day: 1.50    Years: 15.00    Types: Cigarettes    Quit date: 11/28/1964  . Smokeless tobacco: Never Used  . Alcohol use No     Allergies   Sulfa antibiotics   Review of Systems Review of Systems 10/14 systems  reviewed and are negative other than those stated in the HPI   Physical Exam Updated Vital Signs BP (!) 90/50 (BP Location: Right Arm) Comment: alerted MD  Pulse 66   Temp 100.7 F (38.2 C) (Oral) Comment: MD alerted  Resp (!) 35   Ht '5\' 8"'$  (1.727 m)   Wt 229 lb (103.9 kg)   SpO2 93%   BMI 34.82 kg/m   Physical Exam Physical Exam  Nursing note and vitals reviewed. Constitutional: chronically ill appearing. non-toxic, and in no acute distress Head: Normocephalic and bruising around the right eye.  Mouth/Throat: Oropharynx is clear and dry mucous membranes.  Neck: Normal range of motion. Neck supple.  Cardiovascular: Tachycardic rate  and regular rhythm.  no edema Pulmonary/Chest: Effort normal and breath sounds coarse throughout.  Abdominal: Soft. There is no tenderness. There is no rebound and no guarding.  Musculoskeletal: No deformities. Small lacerations involving right hand, bilaterally elbows.  Neurological: Alert, no facial droop, fluent speech, moves all extremities symmetrically, sensation to light touch in tact throughout. Skin: Skin is warm and dry.  Psychiatric: Cooperative   ED Treatments / Results  Labs (all labs ordered are listed, but only abnormal results are displayed) Labs Reviewed  COMPREHENSIVE METABOLIC PANEL - Abnormal; Notable for the following:       Result Value   Sodium 134 (*)    Chloride 99 (*)    BUN 24 (*)    Albumin 3.4 (*)    AST 52 (*)    All other components within normal limits  CBC WITH DIFFERENTIAL/PLATELET - Abnormal; Notable for the following:    WBC 1.6 (*)    Hemoglobin 11.1 (*)    HCT 34.2 (*)    MCV 75.5 (*)    MCH 24.5 (*)    RDW 18.3 (*)    Neutro Abs 0.8 (*)    Lymphs Abs 0.3 (*)    All other components within normal limits  URINALYSIS, ROUTINE W REFLEX MICROSCOPIC - Abnormal; Notable for the following:    APPearance HAZY (*)    Hgb urine dipstick LARGE (*)    Protein, ur 30 (*)    Leukocytes, UA TRACE (*)     Squamous Epithelial / LPF 0-5 (*)    All other components within normal limits  MAGNESIUM - Abnormal; Notable for the following:    Magnesium 1.6 (*)    All other components within normal limits  PROTIME-INR - Abnormal; Notable for the following:    Prothrombin Time 16.2 (*)    All other components within normal limits  INFLUENZA PANEL BY PCR (TYPE A & B) - Abnormal; Notable for the following:    Influenza B By PCR POSITIVE (*)    All other components within normal limits  I-STAT CG4 LACTIC ACID, ED - Abnormal; Notable for the following:    Lactic Acid, Venous 3.46 (*)    All other components within normal limits  I-STAT TROPOININ, ED - Abnormal; Notable for the following:    Troponin i, poc 0.10 (*)    All other components within normal limits  I-STAT CG4 LACTIC ACID, ED - Abnormal; Notable for the following:    Lactic Acid, Venous 2.18 (*)    All other components within normal limits  URINE CULTURE  CULTURE, BLOOD (ROUTINE X 2)  CULTURE, BLOOD (ROUTINE X 2)  CK    EKG  EKG Interpretation  Date/Time:  Sunday December 18 2016 20:28:06 EST Ventricular Rate:  96 PR Interval:    QRS Duration: 92 QT Interval:  345 QTC Calculation: 436 R Axis:   15 Text Interpretation:  Atrial fibrillation Low voltage, precordial leads No significant change since last tracing Confirmed by KNAPP  MD-J, JON ([id number]) on 12/18/2016 8:40:46 PM       Radiology Dg Chest 2 View  Result Date: 12/18/2016 CLINICAL DATA:  Cough. Fall today. Undergoing chemotherapy for lung cancer. EXAM: CHEST  2 VIEW COMPARISON:  Chest radiographs and CT 09/30/2016 FINDINGS: Cardiac silhouette remains mildly enlarged. Aortic atherosclerosis is noted. Mediastinal contours are unchanged. Brachia therapy seeds are again seen associated with an anterior right upper lobe mass. The mass itself is less well seen compared to the prior radiographs, potentially indicating  a reduction in size. There is a persistent small right pleural  effusion with similar appearance of patchy basilar right lung opacity. There may be a trace left pleural effusion. The left lung is grossly clear. No acute osseous abnormality is seen. IMPRESSION: 1. Anterior right upper lobe mass, potentially decreased in size though not well of elevated by radiographs. 2. Persistent small right pleural effusion. 3. Unchanged right basilar opacity which may reflect atelectasis. 4. Aortic atherosclerosis. Electronically Signed   By: Logan Bores M.D.   On: 12/18/2016 21:31   Ct Head Wo Contrast  Result Date: 12/18/2016 CLINICAL DATA:  83 year old male with fall. History of metastatic lung cancer. EXAM: CT HEAD WITHOUT CONTRAST CT CERVICAL SPINE WITHOUT CONTRAST TECHNIQUE: Multidetector CT imaging of the head and cervical spine was performed following the standard protocol without intravenous contrast. Multiplanar CT image reconstructions of the cervical spine were also generated. COMPARISON:  Brain MRI dated 11/14/2016 FINDINGS: CT HEAD FINDINGS Brain: There is a small left frontal subarachnoid hemorrhage (series 13 image 27, sagittal series 6, image 50, and coronal series 5, image 42). No other acute intracranial hemorrhage identified. No mass effect or midline shift. The ventricles and sulci appropriate size for patient's age. Mild periventricular and deep white matter chronic microvascular ischemic changes noted. The previously seen left posterior thalamic metastatic lesion is not well visualized on this noncontrast CT. No mass effect or midline shift noted. No intra-axial fluid collection. Vascular: No hyperdense vessel or unexpected calcification. Skull: Normal. Negative for fracture or focal lesion. Sinuses/Orbits: Mild mucoperiosteal thickening of paranasal sinuses. No air-fluid levels. The mastoid air cells are clear. Bilateral cataract surgeries noted. Mild right periorbital contusion. Other: None CT CERVICAL SPINE FINDINGS Evaluation of this exam is limited due to  motion artifact. Alignment: Normal. Skull base and vertebrae: No acute fracture. Slight irregular appearance of the tip of the odontoid process on the coronal images appears artifactual. No primary bone lesion or focal pathologic process. Soft tissues and spinal canal: No prevertebral fluid or swelling. No visible canal hematoma. Disc levels:  Degenerative changes primarily at C5-C6 and C6-C7. Upper chest: The visualized lung apices are clear. Other: Bilateral carotid bulb atherosclerotic plaques. IMPRESSION: Small left frontal subarachnoid hemorrhage.  Follow-up recommended. Mild age-related atrophy and chronic microvascular ischemic changes. No acute/traumatic cervical spine pathology. These results were called by telephone at the time of interpretation on 12/18/2016 at 10:55 pm to Dr. Brantley Stage , who verbally acknowledged these results. Electronically Signed   By: Anner Crete M.D.   On: 12/18/2016 22:59   Ct Cervical Spine Wo Contrast  Result Date: 12/18/2016 CLINICAL DATA:  83 year old male with fall. History of metastatic lung cancer. EXAM: CT HEAD WITHOUT CONTRAST CT CERVICAL SPINE WITHOUT CONTRAST TECHNIQUE: Multidetector CT imaging of the head and cervical spine was performed following the standard protocol without intravenous contrast. Multiplanar CT image reconstructions of the cervical spine were also generated. COMPARISON:  Brain MRI dated 11/14/2016 FINDINGS: CT HEAD FINDINGS Brain: There is a small left frontal subarachnoid hemorrhage (series 13 image 27, sagittal series 6, image 50, and coronal series 5, image 42). No other acute intracranial hemorrhage identified. No mass effect or midline shift. The ventricles and sulci appropriate size for patient's age. Mild periventricular and deep white matter chronic microvascular ischemic changes noted. The previously seen left posterior thalamic metastatic lesion is not well visualized on this noncontrast CT. No mass effect or midline shift noted. No  intra-axial fluid collection. Vascular: No hyperdense vessel or unexpected calcification.  Skull: Normal. Negative for fracture or focal lesion. Sinuses/Orbits: Mild mucoperiosteal thickening of paranasal sinuses. No air-fluid levels. The mastoid air cells are clear. Bilateral cataract surgeries noted. Mild right periorbital contusion. Other: None CT CERVICAL SPINE FINDINGS Evaluation of this exam is limited due to motion artifact. Alignment: Normal. Skull base and vertebrae: No acute fracture. Slight irregular appearance of the tip of the odontoid process on the coronal images appears artifactual. No primary bone lesion or focal pathologic process. Soft tissues and spinal canal: No prevertebral fluid or swelling. No visible canal hematoma. Disc levels:  Degenerative changes primarily at C5-C6 and C6-C7. Upper chest: The visualized lung apices are clear. Other: Bilateral carotid bulb atherosclerotic plaques. IMPRESSION: Small left frontal subarachnoid hemorrhage.  Follow-up recommended. Mild age-related atrophy and chronic microvascular ischemic changes. No acute/traumatic cervical spine pathology. These results were called by telephone at the time of interpretation on 12/18/2016 at 10:55 pm to Dr. Brantley Stage , who verbally acknowledged these results. Electronically Signed   By: Anner Crete M.D.   On: 12/18/2016 22:59    Procedures Procedures (including critical care time) CRITICAL CARE Performed by: Forde Dandy   Total critical care time: 40 minutes  Critical care time was exclusive of separately billable procedures and treating other patients.  Critical care was necessary to treat or prevent imminent or life-threatening deterioration.  Critical care was time spent personally by me on the following activities: development of treatment plan with patient and/or surrogate as well as nursing, discussions with consultants, evaluation of patient's response to treatment, examination of patient, obtaining  history from patient or surrogate, ordering and performing treatments and interventions, ordering and review of laboratory studies, ordering and review of radiographic studies, pulse oximetry and re-evaluation of patient's condition.  Medications Ordered in ED Medications  hydrogen peroxide 3 % external solution (not administered)  piperacillin-tazobactam (ZOSYN) IVPB 3.375 g (not administered)  metoprolol tartrate (LOPRESSOR) tablet 50 mg (not administered)  losartan (COZAAR) tablet 100 mg (not administered)  0.9 %  sodium chloride infusion (not administered)  folic acid (FOLVITE) tablet 1 mg (not administered)  LORazepam (ATIVAN) tablet 0.5 mg (not administered)  oxybutynin (DITROPAN-XL) 24 hr tablet 10 mg (not administered)  simvastatin (ZOCOR) tablet 40 mg (not administered)  alfuzosin (UROXATRAL) 24 hr tablet 10 mg (not administered)  sodium chloride 0.9 % bolus 1,000 mL (0 mLs Intravenous Stopped 12/18/16 2347)  Tdap (BOOSTRIX) injection 0.5 mL (0.5 mLs Intramuscular Given 12/18/16 2202)  sodium chloride 0.9 % bolus 1,000 mL (0 mLs Intravenous Stopped 12/18/16 2347)  magnesium sulfate IVPB 2 g 50 mL (0 g Intravenous Stopped 12/19/16 0109)  vancomycin (VANCOCIN) IVPB 1000 mg/200 mL premix (1,000 mg Intravenous New Bag/Given 12/19/16 0108)     Initial Impression / Assessment and Plan / ED Course  I have reviewed the triage vital signs and the nursing notes.  Pertinent labs & imaging results that were available during my care of the patient were reviewed by me and considered in my medical decision making (see chart for details).     83 year old male who presents after fall, and possible syncope.   From a standpoint of fall and trauma, CT head visualized and does show left frontal subarachnoid hemorrhage. He is mentating normally and neurologically intact. Discussed with neurosurgery who recommended holding blood thinner, but just observation and nonsurgical management. CT cervical spine  without injury. Was superficial cuts and bruises scattered, but no other major injuries noted on exam. Concern for severe dehydration causing potential syncopal episode  and question sepsis. Received IV fluids. Blood work notable for neutropenia with ANC of 800. He also has elevated lactate of 3.4. He did receive vancomycin and zosyn. UA w/o s/s of infection. CXR visualized and without pneumonia. Influenza swab is pending. Blood cultures are pending. Also with troponin elevation of 0.1 without acute ischemic changes noted on EKG. No chest pain either. This may all be in the setting of demand ischemia. Discussed with Dr. Tamala Julian from hospitalist service will admit for ongoing management.   Final Clinical Impressions(s) / ED Diagnoses   Final diagnoses:  Subarachnoid hemorrhage ([place])  Syncope and collapse  Fall, initial encounter  Dehydration  Elevated lactic acid level    New Prescriptions New Prescriptions   No medications on file     Forde Dandy, MD 12/19/16 0214    Forde Dandy, MD 12/19/16 [phone]

## 2016-12-18 NOTE — ED Notes (Signed)
Patient transported to X-ray 

## 2016-12-18 NOTE — ED Notes (Signed)
Bed: WA06 Expected date:  Expected time:  Means of arrival:  Comments: 83 yo M/ fall- generalized weakness

## 2016-12-18 NOTE — ED Notes (Signed)
RN AND MD NOTIFIED OF PATIENT' S LACTIC ACID RESULTS OF 3.46 ALSO TROPONIN RESULT'S OF 0.10

## 2016-12-18 NOTE — ED Triage Notes (Signed)
PT RECEIVED FROM HOME VIA EMS FOR A FALL. PER EMS, THE PT HAD BEEN LYING ON THE FLOOR FOR ABOUT 4-6 HOURS UNTIL HIS AIDE CAME TO CHECK ON HIM. PT DENIES PAIN, BUT HAS MULTIPLE ABRASIONS TO THE RIGHT EYE, RIGHT HAND, AND BILATERAL ELBOWS. PT STS HE DOES NOT REMEMBER FALLING.

## 2016-12-19 ENCOUNTER — Inpatient Hospital Stay (HOSPITAL_COMMUNITY): Payer: Medicare Other

## 2016-12-19 ENCOUNTER — Ambulatory Visit: Admission: RE | Admit: 2016-12-19 | Payer: Medicare Other | Source: Ambulatory Visit | Admitting: Radiation Oncology

## 2016-12-19 DIAGNOSIS — W19XXXA Unspecified fall, initial encounter: Secondary | ICD-10-CM | POA: Diagnosis present

## 2016-12-19 DIAGNOSIS — R05 Cough: Secondary | ICD-10-CM | POA: Diagnosis not present

## 2016-12-19 DIAGNOSIS — I4891 Unspecified atrial fibrillation: Secondary | ICD-10-CM | POA: Diagnosis not present

## 2016-12-19 DIAGNOSIS — E872 Acidosis: Secondary | ICD-10-CM | POA: Diagnosis not present

## 2016-12-19 DIAGNOSIS — T451X5A Adverse effect of antineoplastic and immunosuppressive drugs, initial encounter: Secondary | ICD-10-CM | POA: Diagnosis present

## 2016-12-19 DIAGNOSIS — I5022 Chronic systolic (congestive) heart failure: Secondary | ICD-10-CM | POA: Diagnosis not present

## 2016-12-19 DIAGNOSIS — J101 Influenza due to other identified influenza virus with other respiratory manifestations: Secondary | ICD-10-CM | POA: Diagnosis present

## 2016-12-19 DIAGNOSIS — S066X0D Traumatic subarachnoid hemorrhage without loss of consciousness, subsequent encounter: Secondary | ICD-10-CM | POA: Diagnosis not present

## 2016-12-19 DIAGNOSIS — I609 Nontraumatic subarachnoid hemorrhage, unspecified: Secondary | ICD-10-CM

## 2016-12-19 DIAGNOSIS — E876 Hypokalemia: Secondary | ICD-10-CM | POA: Diagnosis present

## 2016-12-19 DIAGNOSIS — E860 Dehydration: Secondary | ICD-10-CM | POA: Diagnosis not present

## 2016-12-19 DIAGNOSIS — R197 Diarrhea, unspecified: Secondary | ICD-10-CM | POA: Diagnosis present

## 2016-12-19 DIAGNOSIS — Z7901 Long term (current) use of anticoagulants: Secondary | ICD-10-CM | POA: Diagnosis not present

## 2016-12-19 DIAGNOSIS — Z79899 Other long term (current) drug therapy: Secondary | ICD-10-CM | POA: Diagnosis not present

## 2016-12-19 DIAGNOSIS — R6520 Severe sepsis without septic shock: Secondary | ICD-10-CM | POA: Diagnosis not present

## 2016-12-19 DIAGNOSIS — R6510 Systemic inflammatory response syndrome (SIRS) of non-infectious origin without acute organ dysfunction: Secondary | ICD-10-CM | POA: Diagnosis present

## 2016-12-19 DIAGNOSIS — E119 Type 2 diabetes mellitus without complications: Secondary | ICD-10-CM | POA: Diagnosis not present

## 2016-12-19 DIAGNOSIS — Y92009 Unspecified place in unspecified non-institutional (private) residence as the place of occurrence of the external cause: Secondary | ICD-10-CM | POA: Diagnosis not present

## 2016-12-19 DIAGNOSIS — Z66 Do not resuscitate: Secondary | ICD-10-CM | POA: Diagnosis present

## 2016-12-19 DIAGNOSIS — S066X9A Traumatic subarachnoid hemorrhage with loss of consciousness of unspecified duration, initial encounter: Secondary | ICD-10-CM | POA: Diagnosis not present

## 2016-12-19 DIAGNOSIS — A419 Sepsis, unspecified organism: Secondary | ICD-10-CM | POA: Diagnosis not present

## 2016-12-19 DIAGNOSIS — I480 Paroxysmal atrial fibrillation: Secondary | ICD-10-CM | POA: Diagnosis not present

## 2016-12-19 DIAGNOSIS — W19XXXD Unspecified fall, subsequent encounter: Secondary | ICD-10-CM | POA: Diagnosis not present

## 2016-12-19 DIAGNOSIS — C3411 Malignant neoplasm of upper lobe, right bronchus or lung: Secondary | ICD-10-CM | POA: Diagnosis not present

## 2016-12-19 DIAGNOSIS — Z7952 Long term (current) use of systemic steroids: Secondary | ICD-10-CM | POA: Diagnosis not present

## 2016-12-19 DIAGNOSIS — J111 Influenza due to unidentified influenza virus with other respiratory manifestations: Secondary | ICD-10-CM | POA: Diagnosis not present

## 2016-12-19 DIAGNOSIS — Z7984 Long term (current) use of oral hypoglycemic drugs: Secondary | ICD-10-CM | POA: Diagnosis not present

## 2016-12-19 DIAGNOSIS — C3491 Malignant neoplasm of unspecified part of right bronchus or lung: Secondary | ICD-10-CM | POA: Diagnosis not present

## 2016-12-19 DIAGNOSIS — C7931 Secondary malignant neoplasm of brain: Secondary | ICD-10-CM | POA: Diagnosis not present

## 2016-12-19 DIAGNOSIS — W1830XA Fall on same level, unspecified, initial encounter: Secondary | ICD-10-CM | POA: Diagnosis not present

## 2016-12-19 DIAGNOSIS — I110 Hypertensive heart disease with heart failure: Secondary | ICD-10-CM | POA: Diagnosis not present

## 2016-12-19 DIAGNOSIS — D701 Agranulocytosis secondary to cancer chemotherapy: Secondary | ICD-10-CM | POA: Diagnosis not present

## 2016-12-19 DIAGNOSIS — E7800 Pure hypercholesterolemia, unspecified: Secondary | ICD-10-CM | POA: Diagnosis present

## 2016-12-19 DIAGNOSIS — Z23 Encounter for immunization: Secondary | ICD-10-CM | POA: Diagnosis not present

## 2016-12-19 DIAGNOSIS — Z87891 Personal history of nicotine dependence: Secondary | ICD-10-CM | POA: Diagnosis not present

## 2016-12-19 LAB — BASIC METABOLIC PANEL
Anion gap: 8 (ref 5–15)
BUN: 19 mg/dL (ref 6–20)
CALCIUM: 8.4 mg/dL — AB (ref 8.9–10.3)
CO2: 23 mmol/L (ref 22–32)
Chloride: 104 mmol/L (ref 101–111)
Creatinine, Ser: 0.55 mg/dL — ABNORMAL LOW (ref 0.61–1.24)
Glucose, Bld: 82 mg/dL (ref 65–99)
Potassium: 3.3 mmol/L — ABNORMAL LOW (ref 3.5–5.1)
SODIUM: 135 mmol/L (ref 135–145)

## 2016-12-19 LAB — GLUCOSE, CAPILLARY
GLUCOSE-CAPILLARY: 75 mg/dL (ref 65–99)
GLUCOSE-CAPILLARY: 100 mg/dL — AB (ref 65–99)
GLUCOSE-CAPILLARY: 104 mg/dL — AB (ref 65–99)
GLUCOSE-CAPILLARY: 88 mg/dL (ref 65–99)

## 2016-12-19 LAB — CBC
HCT: 29.6 % — ABNORMAL LOW (ref 39.0–52.0)
Hemoglobin: 9.7 g/dL — ABNORMAL LOW (ref 13.0–17.0)
MCH: 24.3 pg — ABNORMAL LOW (ref 26.0–34.0)
MCHC: 32.8 g/dL (ref 30.0–36.0)
MCV: 74.2 fL — ABNORMAL LOW (ref 78.0–100.0)
PLATELETS: 182 10*3/uL (ref 150–400)
RBC: 3.99 MIL/uL — ABNORMAL LOW (ref 4.22–5.81)
RDW: 18.2 % — AB (ref 11.5–15.5)
WBC: 1.6 10*3/uL — AB (ref 4.0–10.5)

## 2016-12-19 LAB — INFLUENZA PANEL BY PCR (TYPE A & B)
Influenza A By PCR: NEGATIVE
Influenza B By PCR: POSITIVE — AB

## 2016-12-19 LAB — LACTIC ACID, PLASMA
LACTIC ACID, VENOUS: 1 mmol/L (ref 0.5–1.9)
Lactic Acid, Venous: 1.3 mmol/L (ref 0.5–1.9)

## 2016-12-19 LAB — I-STAT CG4 LACTIC ACID, ED: LACTIC ACID, VENOUS: 2.18 mmol/L — AB (ref 0.5–1.9)

## 2016-12-19 LAB — MRSA PCR SCREENING: MRSA BY PCR: NEGATIVE

## 2016-12-19 MED ORDER — METOPROLOL TARTRATE 50 MG PO TABS
50.0000 mg | ORAL_TABLET | Freq: Two times a day (BID) | ORAL | Status: DC
  Administered 2016-12-19 – 2016-12-22 (×8): 50 mg via ORAL
  Filled 2016-12-19: qty 1
  Filled 2016-12-19 (×3): qty 2
  Filled 2016-12-19: qty 1
  Filled 2016-12-19: qty 2
  Filled 2016-12-19 (×2): qty 1

## 2016-12-19 MED ORDER — ALFUZOSIN HCL ER 10 MG PO TB24
10.0000 mg | ORAL_TABLET | Freq: Every day | ORAL | Status: DC
  Administered 2016-12-19 – 2016-12-22 (×4): 10 mg via ORAL
  Filled 2016-12-19 (×4): qty 1

## 2016-12-19 MED ORDER — ALBUTEROL SULFATE (2.5 MG/3ML) 0.083% IN NEBU: 2.5000 mg | INHALATION_SOLUTION | RESPIRATORY_TRACT | Status: DC | PRN

## 2016-12-19 MED ORDER — LOSARTAN POTASSIUM 50 MG PO TABS
100.0000 mg | ORAL_TABLET | Freq: Every day | ORAL | Status: DC
  Administered 2016-12-19 – 2016-12-22 (×4): 100 mg via ORAL
  Filled 2016-12-19 (×5): qty 2

## 2016-12-19 MED ORDER — ONDANSETRON HCL 4 MG/2ML IJ SOLN: 4.0000 mg | Freq: Four times a day (QID) | INTRAMUSCULAR | Status: DC | PRN

## 2016-12-19 MED ORDER — FOLIC ACID 1 MG PO TABS
1.0000 mg | ORAL_TABLET | Freq: Every day | ORAL | Status: DC
Start: 2016-12-19 — End: 2016-12-22
  Administered 2016-12-19 – 2016-12-22 (×4): 1 mg via ORAL
  Filled 2016-12-19 (×4): qty 1

## 2016-12-19 MED ORDER — GUAIFENESIN ER 600 MG PO TB12
600.0000 mg | ORAL_TABLET | Freq: Two times a day (BID) | ORAL | Status: DC
  Administered 2016-12-19 (×3): 600 mg via ORAL
  Filled 2016-12-19 (×3): qty 1

## 2016-12-19 MED ORDER — INSULIN ASPART 100 UNIT/ML ~~LOC~~ SOLN
0.0000 [IU] | Freq: Three times a day (TID) | SUBCUTANEOUS | Status: DC
  Administered 2016-12-20 (×2): 1 [IU] via SUBCUTANEOUS
  Administered 2016-12-21: 2 [IU] via SUBCUTANEOUS
  Administered 2016-12-21: 1 [IU] via SUBCUTANEOUS
  Administered 2016-12-21: 2 [IU] via SUBCUTANEOUS
  Administered 2016-12-22 (×2): 1 [IU] via SUBCUTANEOUS

## 2016-12-19 MED ORDER — POTASSIUM CHLORIDE IN NACL 20-0.9 MEQ/L-% IV SOLN
INTRAVENOUS | Status: DC
  Administered 2016-12-19 – 2016-12-20 (×2): via INTRAVENOUS
  Filled 2016-12-19 (×2): qty 1000

## 2016-12-19 MED ORDER — SODIUM CHLORIDE 0.9 % IV SOLN
1250.0000 mg | Freq: Two times a day (BID) | INTRAVENOUS | Status: DC
  Filled 2016-12-19: qty 1250

## 2016-12-19 MED ORDER — SIMVASTATIN 40 MG PO TABS
40.0000 mg | ORAL_TABLET | Freq: Every day | ORAL | Status: DC
  Administered 2016-12-19: 40 mg via ORAL
  Filled 2016-12-19: qty 1

## 2016-12-19 MED ORDER — PIPERACILLIN-TAZOBACTAM 3.375 G IVPB
3.3750 g | Freq: Once | INTRAVENOUS | Status: AC
  Administered 2016-12-19: 3.375 g via INTRAVENOUS
  Filled 2016-12-19: qty 50

## 2016-12-19 MED ORDER — VANCOMYCIN HCL IN DEXTROSE 1-5 GM/200ML-% IV SOLN
1000.0000 mg | Freq: Once | INTRAVENOUS | Status: AC
  Administered 2016-12-19: 1000 mg via INTRAVENOUS
  Filled 2016-12-19: qty 200

## 2016-12-19 MED ORDER — OXYBUTYNIN CHLORIDE ER 5 MG PO TB24
10.0000 mg | ORAL_TABLET | Freq: Every day | ORAL | Status: DC
  Administered 2016-12-19 – 2016-12-21 (×3): 10 mg via ORAL
  Filled 2016-12-19: qty 2
  Filled 2016-12-19: qty 1
  Filled 2016-12-19: qty 2
  Filled 2016-12-19: qty 1

## 2016-12-19 MED ORDER — OSELTAMIVIR PHOSPHATE 75 MG PO CAPS
75.0000 mg | ORAL_CAPSULE | Freq: Two times a day (BID) | ORAL | Status: DC
  Administered 2016-12-19 – 2016-12-22 (×7): 75 mg via ORAL
  Filled 2016-12-19 (×9): qty 1

## 2016-12-19 MED ORDER — PIPERACILLIN-TAZOBACTAM 3.375 G IVPB: 3.3750 g | Freq: Three times a day (TID) | INTRAVENOUS | Status: DC

## 2016-12-19 MED ORDER — LORAZEPAM 0.5 MG PO TABS: 0.5000 mg | ORAL_TABLET | Freq: Three times a day (TID) | ORAL | Status: DC | PRN

## 2016-12-19 MED ORDER — ONDANSETRON HCL 4 MG PO TABS: 4.0000 mg | ORAL_TABLET | Freq: Four times a day (QID) | ORAL | Status: DC | PRN

## 2016-12-19 MED ORDER — ACETAMINOPHEN 325 MG PO TABS
650.0000 mg | ORAL_TABLET | Freq: Four times a day (QID) | ORAL | Status: DC | PRN
  Administered 2016-12-21: 650 mg via ORAL
  Filled 2016-12-19: qty 2

## 2016-12-19 MED ORDER — SODIUM CHLORIDE 0.9 % IV SOLN
INTRAVENOUS | Status: DC
  Administered 2016-12-19: 03:00:00 via INTRAVENOUS

## 2016-12-19 MED ORDER — SODIUM CHLORIDE 0.9 % IV SOLN
30.0000 meq | Freq: Once | INTRAVENOUS | Status: AC
  Administered 2016-12-19: 30 meq via INTRAVENOUS
  Filled 2016-12-19: qty 15

## 2016-12-19 MED ORDER — ACETAMINOPHEN 650 MG RE SUPP: 650.0000 mg | Freq: Four times a day (QID) | RECTAL | Status: DC | PRN

## 2016-12-19 NOTE — ED Notes (Signed)
Pt on magnesium drip and has poor IV access for second line.  Will start abx as soon as mag is done (less than 56mn left on drip)

## 2016-12-19 NOTE — H&P (Addendum)
History and Physical    [name]:[id number] DOB: 1927/06/03 DOA: 12/18/2016  Referring MD/NP/PA: Dr. Oleta Mouse PCP: Purvis Kilts, MD  Patient coming from: At home  Chief Complaint: Fall  HPI: [name] is a 83 y.o. male with medical history significant of stage IV adenocarcinoma of the right lung s/p radiation(followed by Dr. Tammi Klippel) and chemotherapy (followed by Dr. Earlie Server), systolic CHF last MP53-61% in 07/2016; who presents after having a fall at home. History is elicited from the help of friends present at bedside who help look after the patient. He has no recollection of the events that happened. The fall occurred sometime between 2:30 PM and 5:30 PM when his daughter called and could not get him to answer. Patient was found on the floor of his home. He just completed 10 rounds of radiation at the end of last year and had his first round of chemotherapy approximately 3 weeks ago. Following chemotherapy patient has had progressive decline with generalized weakness. The reported associated symptoms of decreased appetite, poor fluid intake, lower extremity swelling, intermittent confusion, and productive cough. They report his cough is chronic and unchanged from baseline. At baseline patient does not use a cane or walker to ambulate and had continued to be able to drive. Patient denies any chest pain, hemoptysis, or shortness of breath at this time.  ED Course: Upon admission into the emergency department patient was seen have fever up to 100.71F, heart rates up to 127, respirations up to 35, and blood pressure as low as 90/50. Lab work revealed WBC 1.6, hemoglobin 11.1, BUN 24, creatinine 0.76, lactic acid 3.46. Imaging studies revealed a small left subarachnoid hemorrhage, stable/slightly improved CXR.  Review of Systems: As per HPI otherwise 10 point review of systems negative.   Past Medical History:  Diagnosis Date  . Adenocarcinoma of right lung, stage 4 ([place]) 10/13/2016   . Brain metastasis ([place]) 11/03/2016  . Colon polyps   . Encounter for antineoplastic chemotherapy 12/07/2016  . Enlarged prostate   . Essential hypertension   . Goals of care, counseling/discussion 11/17/2016  . History of kidney stones   . Hypercholesteremia   . Lung cancer ([place]) Dx'd 01/2007   Stage IA non-small cell lung cancer, moderately differentiated adenocarcinoma - VATS and seed implants  . Shortness of breath at rest 10/13/2016  . Thoracic aortic aneurysm ([facility])    4.2 cm in AP diameter   . Type 2 diabetes mellitus ([place])     Past Surgical History:  Procedure Laterality Date  . APPENDECTOMY    . CERVICAL SPINE SURGERY    . CHOLECYSTECTOMY    . COLONOSCOPY  04/17/2012   Procedure: COLONOSCOPY;  Surgeon: Jamesetta So, MD;  Location: [facility];  Service: Gastroenterology;  Laterality: N/A;  . COLONOSCOPY W/ POLYPECTOMY    . TONSILLECTOMY    . wedge resection with seed implantation and node sampling with right VATS  03/13/2007     reports that he quit smoking about 52 years ago. His smoking use included Cigarettes. He has a 22.50 pack-year smoking history. He has never used smokeless tobacco. He reports that he does not drink alcohol or use drugs.  Allergies  Allergen Reactions  . Sulfa Antibiotics Rash    Family History  Problem Relation Age of Onset  . Colon cancer Neg Hx   . Cancer Neg Hx     Prior to Admission medications   Medication Sig Start Date End Date Taking? Authorizing Provider  albuterol (PROVENTIL  HFA;VENTOLIN HFA) 108 (90 Base) MCG/ACT inhaler Inhale 2 puffs into the lungs every 6 (six) hours as needed for wheezing or shortness of breath. 10/13/16  Yes Curt Bears, MD  alfuzosin (UROXATRAL) 10 MG 24 hr tablet Take 10 mg by mouth daily.     Yes Historical Provider, MD  apixaban (ELIQUIS) 5 MG TABS tablet Take 1 tablet (5 mg total) by mouth 2 (two) times daily. 08/26/16  Yes Samuella Cota, MD  dexamethasone (DECADRON) 4 MG tablet 4 mg by  mouth twice a day the day before, day of and day after the chemotherapy every 3 weeks 11/17/16  Yes Curt Bears, MD  folic acid (FOLVITE) 1 MG tablet Take 1 tablet (1 mg total) by mouth daily. 11/17/16  Yes Curt Bears, MD  folic acid (FOLVITE) 244 MCG tablet Take 800 mcg by mouth daily.   Yes Historical Provider, MD  furosemide (LASIX) 40 MG tablet Take 1 tablet (40 mg total) by mouth daily. 10/28/16 01/26/17 Yes Satira Sark, MD  glipiZIDE (GLUCOTROL) 5 MG tablet Take 5 mg by mouth 2 (two) times daily before a meal.   Yes Historical Provider, MD  JANUMET 50-1000 MG tablet 1 tablet 2 (two) times daily. 07/21/16  Yes Historical Provider, MD  LORazepam (ATIVAN) 1 MG tablet Take 0.5 tablets (0.5 mg total) by mouth every 8 (eight) hours as needed for anxiety (30 min before MRI). 11/08/16  Yes Tyler Pita, MD  losartan (COZAAR) 100 MG tablet Take 100 mg by mouth daily.   Yes Historical Provider, MD  metoprolol (LOPRESSOR) 50 MG tablet Take 1 tablet (50 mg total) by mouth 2 (two) times daily. 08/26/16  Yes Samuella Cota, MD  Omega-3 Fatty Acids (FISH OIL) 1000 MG CAPS Take by mouth.   Yes Historical Provider, MD  oxybutynin (DITROPAN-XL) 10 MG 24 hr tablet Take 10 mg by mouth at bedtime.   Yes Historical Provider, MD  potassium chloride (K-DUR) 10 MEQ tablet Take 1 tablet (10 mEq total) by mouth daily. 09/26/16 12/25/16 Yes Satira Sark, MD  simvastatin (ZOCOR) 40 MG tablet Take 40 mg by mouth at bedtime.     Yes Historical Provider, MD  Wound Cleansers (RADIAPLEX EX) Apply topically.   Yes Historical Provider, MD  prochlorperazine (COMPAZINE) 10 MG tablet Take 1 tablet (10 mg total) by mouth every 6 (six) hours as needed for nausea or vomiting. Patient not taking: Reported on 12/07/2016 11/17/16   Curt Bears, MD    Physical Exam:   Constitutional: Elderly male NAD, calm, comfortable Vitals:   12/18/16 2015 12/18/16 2016 12/18/16 2137 12/18/16 2300  BP: 128/69  115/59  126/72  Pulse: (!) 127  117 102  Resp: 20  (!) 29 (!) 27  Temp: 98.7 F (37.1 C)     TempSrc: Oral     SpO2: 95%  92% 95%  Weight:  103.9 kg (229 lb)    Height:  '5\' 8"'$  (1.727 m)    Eyes: PERRL, lids and conjunctivae normal. Bruising around the right eye ENMT: Mucous membranes are dry. Posterior pharynx clear of any exudate or lesions. Neck: normal, supple, no masses, no thyromegaly Respiratory: Tachypneic, with coarse breath sounds. Coughing with productive sputum.  No significant wheezing appreciated. Cardiovascular: Irregular regular, no murmurs / rubs / gallops. +1  extremity edema. 2+ pedal pulses. No carotid bruits.  Abdomen: no tenderness, no masses palpated. No hepatosplenomegaly. Bowel sounds positive.  Musculoskeletal: no clubbing / cyanosis. No joint deformity upper and lower extremities. Good  ROM, no contractures. Normal muscle tone.  Skin: Bruising noted, otherwise warm and dry Neurologic: CN 2-12 grossly intact. Sensation intact, DTR normal. Strength 5/5 in all 4.  Psychiatric: Normal judgment and insight. Alert and oriented x 2. Normal mood.    Labs on Admission: I have personally reviewed following labs and imaging studies  CBC:  Recent Labs Lab 12/15/16 1037 12/18/16 2107  WBC 2.9* 1.6*  NEUTROABS 1.4* 0.8*  HGB 11.4* 11.1*  HCT 36.3* 34.2*  MCV 78.6 75.5*  PLT 101* 144   Basic Metabolic Panel:  Recent Labs Lab 12/15/16 1037 12/18/16 2107 12/18/16 2155  NA 136 134*  --   K 4.0 3.8  --   CL 100* 99*  --   CO2 26 24  --   GLUCOSE 144* 83  --   BUN 18 24*  --   CREATININE 0.69 0.76  --   CALCIUM 9.9 9.6  --   MG  --   --  1.6*   GFR: Estimated Creatinine Clearance: 76 mL/min (by C-G formula based on SCr of 0.76 mg/dL). Liver Function Tests:  Recent Labs Lab 12/15/16 1037 12/18/16 2107  AST 21 52*  ALT 21 30  ALKPHOS 62 63  BILITOT 0.5 0.6  PROT 7.0 6.6  ALBUMIN 3.5 3.4*   No results for input(s): LIPASE, AMYLASE in the last 168  hours. No results for input(s): AMMONIA in the last 168 hours. Coagulation Profile:  Recent Labs Lab 12/18/16 2155  INR 1.29   Cardiac Enzymes:  Recent Labs Lab 12/18/16 2155  CKTOTAL 207   BNP (last 3 results) No results for input(s): PROBNP in the last 8760 hours. HbA1C: No results for input(s): HGBA1C in the last 72 hours. CBG: No results for input(s): GLUCAP in the last 168 hours. Lipid Profile: No results for input(s): CHOL, HDL, LDLCALC, TRIG, CHOLHDL, LDLDIRECT in the last 72 hours. Thyroid Function Tests: No results for input(s): TSH, T4TOTAL, FREET4, T3FREE, THYROIDAB in the last 72 hours. Anemia Panel: No results for input(s): VITAMINB12, FOLATE, FERRITIN, TIBC, IRON, RETICCTPCT in the last 72 hours. Urine analysis:    Component Value Date/Time   COLORURINE YELLOW 12/18/2016 2317   APPEARANCEUR HAZY (A) 12/18/2016 2317   LABSPEC 1.016 12/18/2016 2317   PHURINE 5.0 12/18/2016 2317   GLUCOSEU NEGATIVE 12/18/2016 2317   HGBUR LARGE (A) 12/18/2016 2317   BILIRUBINUR NEGATIVE 12/18/2016 2317   KETONESUR NEGATIVE 12/18/2016 2317   PROTEINUR 30 (A) 12/18/2016 2317   NITRITE NEGATIVE 12/18/2016 2317   LEUKOCYTESUR TRACE (A) 12/18/2016 2317   Sepsis Labs: No results found for this or any previous visit (from the past 240 hour(s)).   Radiological Exams on Admission: Dg Chest 2 View  Result Date: 12/18/2016 CLINICAL DATA:  Cough. Fall today. Undergoing chemotherapy for lung cancer. EXAM: CHEST  2 VIEW COMPARISON:  Chest radiographs and CT 09/30/2016 FINDINGS: Cardiac silhouette remains mildly enlarged. Aortic atherosclerosis is noted. Mediastinal contours are unchanged. Brachia therapy seeds are again seen associated with an anterior right upper lobe mass. The mass itself is less well seen compared to the prior radiographs, potentially indicating a reduction in size. There is a persistent small right pleural effusion with similar appearance of patchy basilar right lung  opacity. There may be a trace left pleural effusion. The left lung is grossly clear. No acute osseous abnormality is seen. IMPRESSION: 1. Anterior right upper lobe mass, potentially decreased in size though not well of elevated by radiographs. 2. Persistent small right pleural effusion.  3. Unchanged right basilar opacity which may reflect atelectasis. 4. Aortic atherosclerosis. Electronically Signed   By: Logan Bores M.D.   On: 12/18/2016 21:31   Ct Head Wo Contrast  Result Date: 12/18/2016 CLINICAL DATA:  83 year old male with fall. History of metastatic lung cancer. EXAM: CT HEAD WITHOUT CONTRAST CT CERVICAL SPINE WITHOUT CONTRAST TECHNIQUE: Multidetector CT imaging of the head and cervical spine was performed following the standard protocol without intravenous contrast. Multiplanar CT image reconstructions of the cervical spine were also generated. COMPARISON:  Brain MRI dated 11/14/2016 FINDINGS: CT HEAD FINDINGS Brain: There is a small left frontal subarachnoid hemorrhage (series 13 image 27, sagittal series 6, image 50, and coronal series 5, image 42). No other acute intracranial hemorrhage identified. No mass effect or midline shift. The ventricles and sulci appropriate size for patient's age. Mild periventricular and deep white matter chronic microvascular ischemic changes noted. The previously seen left posterior thalamic metastatic lesion is not well visualized on this noncontrast CT. No mass effect or midline shift noted. No intra-axial fluid collection. Vascular: No hyperdense vessel or unexpected calcification. Skull: Normal. Negative for fracture or focal lesion. Sinuses/Orbits: Mild mucoperiosteal thickening of paranasal sinuses. No air-fluid levels. The mastoid air cells are clear. Bilateral cataract surgeries noted. Mild right periorbital contusion. Other: None CT CERVICAL SPINE FINDINGS Evaluation of this exam is limited due to motion artifact. Alignment: Normal. Skull base and vertebrae: No  acute fracture. Slight irregular appearance of the tip of the odontoid process on the coronal images appears artifactual. No primary bone lesion or focal pathologic process. Soft tissues and spinal canal: No prevertebral fluid or swelling. No visible canal hematoma. Disc levels:  Degenerative changes primarily at C5-C6 and C6-C7. Upper chest: The visualized lung apices are clear. Other: Bilateral carotid bulb atherosclerotic plaques. IMPRESSION: Small left frontal subarachnoid hemorrhage.  Follow-up recommended. Mild age-related atrophy and chronic microvascular ischemic changes. No acute/traumatic cervical spine pathology. These results were called by telephone at the time of interpretation on 12/18/2016 at 10:55 pm to Dr. Brantley Stage , who verbally acknowledged these results. Electronically Signed   By: Anner Crete M.D.   On: 12/18/2016 22:59   Ct Cervical Spine Wo Contrast  Result Date: 12/18/2016 CLINICAL DATA:  83 year old male with fall. History of metastatic lung cancer. EXAM: CT HEAD WITHOUT CONTRAST CT CERVICAL SPINE WITHOUT CONTRAST TECHNIQUE: Multidetector CT imaging of the head and cervical spine was performed following the standard protocol without intravenous contrast. Multiplanar CT image reconstructions of the cervical spine were also generated. COMPARISON:  Brain MRI dated 11/14/2016 FINDINGS: CT HEAD FINDINGS Brain: There is a small left frontal subarachnoid hemorrhage (series 13 image 27, sagittal series 6, image 50, and coronal series 5, image 42). No other acute intracranial hemorrhage identified. No mass effect or midline shift. The ventricles and sulci appropriate size for patient's age. Mild periventricular and deep white matter chronic microvascular ischemic changes noted. The previously seen left posterior thalamic metastatic lesion is not well visualized on this noncontrast CT. No mass effect or midline shift noted. No intra-axial fluid collection. Vascular: No hyperdense vessel or  unexpected calcification. Skull: Normal. Negative for fracture or focal lesion. Sinuses/Orbits: Mild mucoperiosteal thickening of paranasal sinuses. No air-fluid levels. The mastoid air cells are clear. Bilateral cataract surgeries noted. Mild right periorbital contusion. Other: None CT CERVICAL SPINE FINDINGS Evaluation of this exam is limited due to motion artifact. Alignment: Normal. Skull base and vertebrae: No acute fracture. Slight irregular appearance of the tip of the odontoid  process on the coronal images appears artifactual. No primary bone lesion or focal pathologic process. Soft tissues and spinal canal: No prevertebral fluid or swelling. No visible canal hematoma. Disc levels:  Degenerative changes primarily at C5-C6 and C6-C7. Upper chest: The visualized lung apices are clear. Other: Bilateral carotid bulb atherosclerotic plaques. IMPRESSION: Small left frontal subarachnoid hemorrhage.  Follow-up recommended. Mild age-related atrophy and chronic microvascular ischemic changes. No acute/traumatic cervical spine pathology. These results were called by telephone at the time of interpretation on 12/18/2016 at 10:55 pm to Dr. Brantley Stage , who verbally acknowledged these results. Electronically Signed   By: Anner Crete M.D.   On: 12/18/2016 22:59    EKG: Independently reviewed. Atrial fibrillation  Assessment/Plan  Fall with subarachnoid hemorrhage: Acute. Patient apparently fell and was down for some unknown period in time. Initial CT of brain showed a small left frontal subarachnoid hemorrhage. Neurosurgery was consulted by the ED physician and recommended holding Eliquis and to monitor. - Admit to a telemetry bed - Neuro checks every 2 hr  - Consider need of repeat CT scan  SIRS/Sepsis, unknown origin: Patient presents tachycardic, tachypneic, with leukopenia, and elevated lactic acid of 3.46. - Follow-up blood/urine cultures - Trend lactic acid levels - Initially placed on vancomycin and  Zosyn - Reassess in a.m. and determine need of continued antibiotics  Transient Hypotension: Initial blood pressure noted to be as low as 90/50. With improvement after restarting IV fluids - NS IV fluids at 113m/hr  Lactic acidosis: Likely secondary to dehydration - Trend lactic acid levels    Dehydration - Held furosemide  - Continue IV fluids as tolerated  Metastatic adenocarcinoma of the lung stage IV - per Dr. MEarlie ServerAnd Dr. MTammi Klippel Systolic heart failure last EF noted to be 45-50%. - Continue to monitor to avoid fluid overload  Atrial fibrillation on Elqiuis - Eliquis Held 2/2 SAH - Continue metoprolol, as tolerated  Essential hypertension - Continue Cozaar and medications as seen above  Diabetes mellitus type 2 - Hypoglycemic protocols - Held glipizide and Janumet - SSI  Diarrhea    DVT prophylaxis: SCDs  Code Status: DNR  Family Communication: Discuss plan of care with patient and friends who are present at bedside Disposition Plan: TBD Consults called: Neurosurgery Admission status: Inpatient   RNorval MortonMD [facility] Pager [phone] If 7PM-7AM, please contact night-coverage [email or web address] Password [email or web address] 12/19/2016, 12:57 AM

## 2016-12-19 NOTE — ED Notes (Signed)
RN AND MD NOTIFIED OF PATIENT'S LACTIC ACID RESULTS OF   2.19

## 2016-12-19 NOTE — Progress Notes (Signed)
12/18/2016  7:47 PM  12/19/2016 7:41 AM  [name] was seen and examined.  The H&P by the admitting provider , orders, imaging was reviewed.  Lactic acid has normalized.  Reducing IVFs.  Please see orders.  Will continue to follow.  Awaiting for Neurosurgery to see.   Murvin Natal, MD [facility]

## 2016-12-19 NOTE — ED Notes (Signed)
[name] (family) contact # [phone]

## 2016-12-19 NOTE — Progress Notes (Signed)
Pharmacy Antibiotic Note  [name] is a 83 y.o. male admitted on 12/18/2016 with sepsis.  Pharmacy has been consulted for Vancomycin and Zosyn dosing.  Plan: Vancomycin '1250mg'$  IV every 12 hours.  Goal trough 15-20 mcg/mL. Zosyn 3.375g IV q8h (4 hour infusion).  Height: '5\' 9"'$  (175.3 cm) Weight: 217 lb 9.5 oz (98.7 kg) IBW/kg (Calculated) : 70.7  Temp (24hrs), Avg:99.5 F (37.5 C), Min:98.7 F (37.1 C), Max:100.7 F (38.2 C)   Recent Labs Lab 12/15/16 1037 12/18/16 2107 12/18/16 2121 12/19/16 0004 12/19/16 0223  WBC 2.9* 1.6*  --   --   --   CREATININE 0.69 0.76  --   --   --   LATICACIDVEN  --   --  3.46* 2.18* 1.3    Estimated Creatinine Clearance: 75.4 mL/min (by C-G formula based on SCr of 0.76 mg/dL).    Allergies  Allergen Reactions  . Sulfa Antibiotics Rash    Antimicrobials this admission: Vancomycin 12/19/2016 >> Zosyn 12/19/2016 >>   Dose adjustments this admission: -  Microbiology results: pending  Thank you for allowing pharmacy to be a part of this patient's care.  Nani Skillern [place]ford 12/19/2016 4:41 AM

## 2016-12-20 ENCOUNTER — Inpatient Hospital Stay (HOSPITAL_COMMUNITY): Payer: Medicare Other

## 2016-12-20 DIAGNOSIS — R6510 Systemic inflammatory response syndrome (SIRS) of non-infectious origin without acute organ dysfunction: Secondary | ICD-10-CM

## 2016-12-20 DIAGNOSIS — D701 Agranulocytosis secondary to cancer chemotherapy: Secondary | ICD-10-CM

## 2016-12-20 DIAGNOSIS — I609 Nontraumatic subarachnoid hemorrhage, unspecified: Secondary | ICD-10-CM

## 2016-12-20 DIAGNOSIS — C7931 Secondary malignant neoplasm of brain: Secondary | ICD-10-CM

## 2016-12-20 DIAGNOSIS — D6481 Anemia due to antineoplastic chemotherapy: Secondary | ICD-10-CM

## 2016-12-20 DIAGNOSIS — I4891 Unspecified atrial fibrillation: Secondary | ICD-10-CM

## 2016-12-20 DIAGNOSIS — C3411 Malignant neoplasm of upper lobe, right bronchus or lung: Secondary | ICD-10-CM

## 2016-12-20 LAB — BASIC METABOLIC PANEL
ANION GAP: 8 (ref 5–15)
Anion gap: 6 (ref 5–15)
BUN: 13 mg/dL (ref 6–20)
BUN: 13 mg/dL (ref 6–20)
CALCIUM: 8.1 mg/dL — AB (ref 8.9–10.3)
CO2: 24 mmol/L (ref 22–32)
CO2: 22 mmol/L (ref 22–32)
Calcium: 8.4 mg/dL — ABNORMAL LOW (ref 8.9–10.3)
Chloride: 103 mmol/L (ref 101–111)
Chloride: 101 mmol/L (ref 101–111)
Creatinine, Ser: 0.52 mg/dL — ABNORMAL LOW (ref 0.61–1.24)
Creatinine, Ser: 0.59 mg/dL — ABNORMAL LOW (ref 0.61–1.24)
GFR calc Af Amer: >60 mL/min (ref >60)
GFR calc non Af Amer: >60 mL/min (ref >60)
Glucose, Bld: 83 mg/dL (ref 65–99)
Glucose, Bld: 261 mg/dL — ABNORMAL HIGH (ref 65–99)
Potassium: 4 mmol/L (ref 3.5–5.1)
Potassium: 3.3 mmol/L — ABNORMAL LOW (ref 3.5–5.1)
SODIUM: 131 mmol/L — AB (ref 135–145)
Sodium: 133 mmol/L — ABNORMAL LOW (ref 135–145)

## 2016-12-20 LAB — GLUCOSE, CAPILLARY
GLUCOSE-CAPILLARY: 105 mg/dL — AB (ref 65–99)
GLUCOSE-CAPILLARY: 269 mg/dL — AB (ref 65–99)
Glucose-Capillary: 129 mg/dL — ABNORMAL HIGH (ref 65–99)
Glucose-Capillary: 125 mg/dL — ABNORMAL HIGH (ref 65–99)

## 2016-12-20 LAB — CBC
HCT: 31.2 % — ABNORMAL LOW (ref 39.0–52.0)
Hemoglobin: 10.2 g/dL — ABNORMAL LOW (ref 13.0–17.0)
MCH: 24.3 pg — ABNORMAL LOW (ref 26.0–34.0)
MCHC: 32.7 g/dL (ref 30.0–36.0)
MCV: 74.5 fL — ABNORMAL LOW (ref 78.0–100.0)
Platelets: 199 10*3/uL (ref 150–400)
RBC: 4.19 MIL/uL — ABNORMAL LOW (ref 4.22–5.81)
RDW: 18.3 % — ABNORMAL HIGH (ref 11.5–15.5)
WBC: 1.8 10*3/uL — ABNORMAL LOW (ref 4.0–10.5)

## 2016-12-20 LAB — URINE CULTURE: Culture: NO GROWTH

## 2016-12-20 LAB — MAGNESIUM: MAGNESIUM: 1.6 mg/dL — AB (ref 1.7–2.4)

## 2016-12-20 MED ORDER — DILTIAZEM HCL 25 MG/5ML IV SOLN
5.0000 mg | Freq: Once | INTRAVENOUS | Status: AC
  Administered 2016-12-20: 5 mg via INTRAVENOUS
  Filled 2016-12-20: qty 5

## 2016-12-20 MED ORDER — GUAIFENESIN-DM 100-10 MG/5ML PO SYRP
5.0000 mL | ORAL_SOLUTION | ORAL | Status: DC | PRN
  Administered 2016-12-20 – 2016-12-22 (×2): 5 mL via ORAL
  Filled 2016-12-20 (×2): qty 10

## 2016-12-20 MED ORDER — SODIUM CHLORIDE 0.9 % IV BOLUS (SEPSIS)
500.0000 mL | Freq: Once | INTRAVENOUS | Status: AC
  Administered 2016-12-20: 500 mL via INTRAVENOUS

## 2016-12-20 MED ORDER — MAGNESIUM SULFATE 2 GM/50ML IV SOLN
2.0000 g | Freq: Once | INTRAVENOUS | Status: AC
  Administered 2016-12-20: 2 g via INTRAVENOUS
  Filled 2016-12-20: qty 50

## 2016-12-20 MED ORDER — POTASSIUM CHLORIDE ER 10 MEQ PO TBCR
10.0000 meq | EXTENDED_RELEASE_TABLET | Freq: Every day | ORAL | Status: DC
  Administered 2016-12-21: 10 meq via ORAL
  Filled 2016-12-20 (×4): qty 1

## 2016-12-20 MED ORDER — ATORVASTATIN CALCIUM 20 MG PO TABS
20.0000 mg | ORAL_TABLET | Freq: Every day | ORAL | Status: DC
  Administered 2016-12-20 – 2016-12-21 (×2): 20 mg via ORAL
  Filled 2016-12-20 (×2): qty 1

## 2016-12-20 MED ORDER — FUROSEMIDE 40 MG PO TABS
40.0000 mg | ORAL_TABLET | Freq: Every day | ORAL | Status: DC
  Administered 2016-12-21: 40 mg via ORAL
  Filled 2016-12-20 (×2): qty 1

## 2016-12-20 MED ORDER — AMLODIPINE BESYLATE 5 MG PO TABS
5.0000 mg | ORAL_TABLET | Freq: Every day | ORAL | Status: DC
Start: 2016-12-20 — End: 2016-12-22
  Administered 2016-12-20 – 2016-12-22 (×3): 5 mg via ORAL
  Filled 2016-12-20 (×3): qty 1

## 2016-12-20 MED ORDER — POTASSIUM CHLORIDE CRYS ER 20 MEQ PO TBCR
20.0000 meq | EXTENDED_RELEASE_TABLET | Freq: Once | ORAL | Status: AC
  Administered 2016-12-20: 20 meq via ORAL
  Filled 2016-12-20: qty 1

## 2016-12-20 NOTE — Progress Notes (Signed)
DIAGNOSIS: Stage IV (T3, N2, M1b) poorly differentiated adenocarcinoma diagnosed in November 2017. He was initially diagnosed with a stage IA non-small cell lung cancer, moderately differentiated adenocarcinoma in April 2008. PDL 1 expression 0%.  MOLECULAR STUDIES: Genomic Alterations Identified? BRCA2 [id number]* AKT2 amplification - equivocal? TP53 [id number] Additional Findings? Microsatellite status MS-Stable Tumor Mutation Burden TMB-Intermediate; 12 Muts/Mb Additional Disease-relevant Genes with No Reportable Alterations Identified? EGFR KRAS ALK BRAF MET RET ERBB2 ROS1  PRIOR THERAPY: 1) status post wedge resection of the right upper lobe with seed implants under the care of Dr. BArlyce Diceon 10/13/2007. 2) palliative radiotherapy to the large right upper lobe lung mass under the care of Dr. MTammi Klippel 3) stereotactic radiotherapy to a solitary left thalamic brain metastasis on 11/17/2016.  CURRENT THERAPY: Systemic chemotherapy with carboplatin for AUC of 5, Alimta 500 MG/M2 and Avastin 15 MG/KG every 3 weeks. First dose 08/30/2016. Status post one cycle.  Subjective: The patient is seen and examined today. [facility] at home. He has significant ecchymosis at the right eye. CT scan of the head performed yesterday showed small subarachnoid hemorrhage which was resolved on repeat CT scan earlier today. He was also found to have positive influenza B. This probably resulted his fever and dehydration as well as the dizzy spells. He is currently on treatment with Tamiflu. He is feeling a little bit better today. He denied having any current fever or chills. He has no chest pain, shortness of breath or hemoptysis. He was on treatment with Eliquis for atrial fibrillation but this is currently on hold after his subarachnoid hemorrhage.   Objective: Vital signs in last 24 hours: Temp:  [97.4 F (36.3 C)-98.2 F (36.8 C)] 97.4  F (36.3 C) (01/23 1200) Pulse Rate:  [75-107] 88 (01/23 0800) Resp:  [15-30] 23 (01/23 0800) BP: (136-171)/(65-94) 171/94 (01/23 0800) SpO2:  [94 %-99 %] 97 % (01/23 0800)  Intake/Output from previous day: 01/22 0701 - 01/23 0700 In: 1576.3 [I.V.:1311.3; IV Piggyback:265] Out: 1275 [Urine:1275] Intake/Output this shift: Total I/O In: -  Out: 475 [Urine:475]  General appearance: alert, cooperative, no distress and Ecchymosis of the right eye Resp: clear to auscultation bilaterally Cardio: regular rate and rhythm, S1, S2 normal, no murmur, click, rub or gallop GI: soft, non-tender; bowel sounds normal; no masses,  no organomegaly Extremities: extremities normal, atraumatic, no cyanosis or edema  Lab Results:   Recent Labs  12/19/16 0553 12/20/16 0337  WBC 1.6* 1.8*  HGB 9.7* 10.2*  HCT 29.6* 31.2*  PLT 182 199   BMET  Recent Labs  12/19/16 0553 12/20/16 0337  NA 135 133*  K 3.3* 4.0  CL 104 103  CO2 23 24  GLUCOSE 82 83  BUN 19 13  CREATININE 0.55* 0.52*  CALCIUM 8.4* 8.4*    Studies/Results: Dg Chest 2 View  Result Date: 12/18/2016 CLINICAL DATA:  Cough. Fall today. Undergoing chemotherapy for lung cancer. EXAM: CHEST  2 VIEW COMPARISON:  Chest radiographs and CT 09/30/2016 FINDINGS: Cardiac silhouette remains mildly enlarged. Aortic atherosclerosis is noted. Mediastinal contours are unchanged. Brachia therapy seeds are again seen associated with an anterior right upper lobe mass. The mass itself is less well seen compared to the prior radiographs, potentially indicating a reduction in size. There is a persistent small right pleural effusion with similar appearance of patchy basilar right lung opacity. There may be a trace left pleural effusion. The left lung is grossly clear. No acute  osseous abnormality is seen. IMPRESSION: 1. Anterior right upper lobe mass, potentially decreased in size though not well of elevated by radiographs. 2. Persistent small right  pleural effusion. 3. Unchanged right basilar opacity which may reflect atelectasis. 4. Aortic atherosclerosis. Electronically Signed   By: Logan Bores M.D.   On: 12/18/2016 21:31   Ct Head Wo Contrast  Result Date: 12/19/2016 CLINICAL DATA:  Acute onset of generalized weakness. Current history of metastatic lung adenocarcinoma. Initial encounter. EXAM: CT HEAD WITHOUT CONTRAST TECHNIQUE: Contiguous axial images were obtained from the base of the skull through the vertex without intravenous contrast. COMPARISON:  CT of the head performed 12/18/2016 FINDINGS: Brain: No evidence of acute infarction, hydrocephalus, extra-axial collection or mass lesion/mass effect. The previously noted small focus of acute subarachnoid hemorrhage at the left frontoparietal region is less apparent than on the prior study. Prominence of the ventricles and sulci reflects mild to moderate cortical volume loss. Mild cerebellar atrophy is noted. Scattered periventricular white matter change likely reflects small vessel ischemic microangiopathy. The brainstem and fourth ventricle are within normal limits. The basal ganglia are unremarkable in appearance. The cerebral hemispheres demonstrate grossly normal gray-white differentiation. No mass effect or midline shift is seen. Vascular: No hyperdense vessel or unexpected calcification. Skull: There is no evidence of fracture; visualized osseous structures are unremarkable in appearance. Sinuses/Orbits: The orbits are within normal limits. Mild mucosal thickening is noted at the maxillary sinuses bilaterally. The remaining paranasal sinuses and mastoid air cells are well-aerated. Other: No significant soft tissue abnormalities are seen. IMPRESSION: 1. No acute intracranial pathology seen on CT. 2. Previously noted small focus of acute subarachnoid hemorrhage at the left frontoparietal region is less apparent than on the prior study. 3. Mild to moderate cortical volume loss and scattered small  vessel ischemic microangiopathy. 4. Mild mucosal thickening at the maxillary sinuses bilaterally. Electronically Signed   By: Garald Balding M.D.   On: 12/19/2016 22:44   Ct Head Wo Contrast  Result Date: 12/18/2016 CLINICAL DATA:  83 year old male with fall. History of metastatic lung cancer. EXAM: CT HEAD WITHOUT CONTRAST CT CERVICAL SPINE WITHOUT CONTRAST TECHNIQUE: Multidetector CT imaging of the head and cervical spine was performed following the standard protocol without intravenous contrast. Multiplanar CT image reconstructions of the cervical spine were also generated. COMPARISON:  Brain MRI dated 11/14/2016 FINDINGS: CT HEAD FINDINGS Brain: There is a small left frontal subarachnoid hemorrhage (series 13 image 27, sagittal series 6, image 50, and coronal series 5, image 42). No other acute intracranial hemorrhage identified. No mass effect or midline shift. The ventricles and sulci appropriate size for patient's age. Mild periventricular and deep white matter chronic microvascular ischemic changes noted. The previously seen left posterior thalamic metastatic lesion is not well visualized on this noncontrast CT. No mass effect or midline shift noted. No intra-axial fluid collection. Vascular: No hyperdense vessel or unexpected calcification. Skull: Normal. Negative for fracture or focal lesion. Sinuses/Orbits: Mild mucoperiosteal thickening of paranasal sinuses. No air-fluid levels. The mastoid air cells are clear. Bilateral cataract surgeries noted. Mild right periorbital contusion. Other: None CT CERVICAL SPINE FINDINGS Evaluation of this exam is limited due to motion artifact. Alignment: Normal. Skull base and vertebrae: No acute fracture. Slight irregular appearance of the tip of the odontoid process on the coronal images appears artifactual. No primary bone lesion or focal pathologic process. Soft tissues and spinal canal: No prevertebral fluid or swelling. No visible canal hematoma. Disc levels:   Degenerative changes primarily at C5-C6 and  C6-C7. Upper chest: The visualized lung apices are clear. Other: Bilateral carotid bulb atherosclerotic plaques. IMPRESSION: Small left frontal subarachnoid hemorrhage.  Follow-up recommended. Mild age-related atrophy and chronic microvascular ischemic changes. No acute/traumatic cervical spine pathology. These results were called by telephone at the time of interpretation on 12/18/2016 at 10:55 pm to Dr. Brantley Stage , who verbally acknowledged these results. Electronically Signed   By: Anner Crete M.D.   On: 12/18/2016 22:59   Ct Cervical Spine Wo Contrast  Result Date: 12/18/2016 CLINICAL DATA:  83 year old male with fall. History of metastatic lung cancer. EXAM: CT HEAD WITHOUT CONTRAST CT CERVICAL SPINE WITHOUT CONTRAST TECHNIQUE: Multidetector CT imaging of the head and cervical spine was performed following the standard protocol without intravenous contrast. Multiplanar CT image reconstructions of the cervical spine were also generated. COMPARISON:  Brain MRI dated 11/14/2016 FINDINGS: CT HEAD FINDINGS Brain: There is a small left frontal subarachnoid hemorrhage (series 13 image 27, sagittal series 6, image 50, and coronal series 5, image 42). No other acute intracranial hemorrhage identified. No mass effect or midline shift. The ventricles and sulci appropriate size for patient's age. Mild periventricular and deep white matter chronic microvascular ischemic changes noted. The previously seen left posterior thalamic metastatic lesion is not well visualized on this noncontrast CT. No mass effect or midline shift noted. No intra-axial fluid collection. Vascular: No hyperdense vessel or unexpected calcification. Skull: Normal. Negative for fracture or focal lesion. Sinuses/Orbits: Mild mucoperiosteal thickening of paranasal sinuses. No air-fluid levels. The mastoid air cells are clear. Bilateral cataract surgeries noted. Mild right periorbital contusion. Other:  None CT CERVICAL SPINE FINDINGS Evaluation of this exam is limited due to motion artifact. Alignment: Normal. Skull base and vertebrae: No acute fracture. Slight irregular appearance of the tip of the odontoid process on the coronal images appears artifactual. No primary bone lesion or focal pathologic process. Soft tissues and spinal canal: No prevertebral fluid or swelling. No visible canal hematoma. Disc levels:  Degenerative changes primarily at C5-C6 and C6-C7. Upper chest: The visualized lung apices are clear. Other: Bilateral carotid bulb atherosclerotic plaques. IMPRESSION: Small left frontal subarachnoid hemorrhage.  Follow-up recommended. Mild age-related atrophy and chronic microvascular ischemic changes. No acute/traumatic cervical spine pathology. These results were called by telephone at the time of interpretation on 12/18/2016 at 10:55 pm to Dr. Brantley Stage , who verbally acknowledged these results. Electronically Signed   By: Anner Crete M.D.   On: 12/18/2016 22:59   Dg Chest Port 1 View  Result Date: 12/20/2016 CLINICAL DATA:  Cough, weakness. EXAM: PORTABLE CHEST 1 VIEW COMPARISON:  Radiographs of December 18, 2016. FINDINGS: Stable cardiomediastinal silhouette. Atherosclerosis of thoracic aorta is noted. No pneumothorax is noted. Left lung is clear. Mild right basilar subsegmental atelectasis or infiltrate is noted with minimal associated pleural effusion. Stable presence of brachytherapy seeds are again noted in right upper lobe. IMPRESSION: Aortic atherosclerosis. Mild right basilar subsegmental atelectasis or infiltrate with minimal associated pleural effusion. Electronically Signed   By: Marijo Conception, M.D.   On: 12/20/2016 09:09    Medications: I have reviewed the patient's current medications.  CODE STATUS: No CODE BLUE  Assessment/Plan: This is a very pleasant 83 years old white male with recurrent non-small cell lung cancer who is currently undergoing systemic chemotherapy  with carboplatin, Alimta and Avastin status post 1 cycle. The patient was supposed to start cycle #2 on 12/22/2016. I would hold his treatment for another week until improvement of his condition. I may  discontinue his treatment with Avastin after the recent subarachnoid hemorrhage. For the recent subarachnoid hemorrhage, he is slowly improving and the recent CT scan showed improvement of his condition. For the chemotherapy-induced neutropenia, we'll continue to monitor his white blood count for now. I would consider the patient for treatment with Granix if his absolute neutrophil count is less than 500. For the chemotherapy-induced anemia, his hemoglobin and hematocrit are stable. We'll continue to monitor for now. I for the atrial fibrillation, he is followed by cardiology and I'm in agreement with holding his treatment with Eliquis for now thank you so much for taking good care of Mr. Dorner. I will continue to follow up the patient with you and assist in his management on as-needed basis.   LOS: 1 day    Okley Magnussen K. 12/20/2016

## 2016-12-20 NOTE — Progress Notes (Addendum)
PROGRESS NOTE    [name]  WRU:[id number]  DOB: August 11, 1927  DOA: 12/18/2016 PCP: Purvis Kilts, MD  Hospital course: [name] is a 83 y.o. male with medical history significant of stage IV adenocarcinoma of the right lung s/p radiation(followed by Dr. Tammi Klippel) and chemotherapy (followed by Dr. Earlie Server), systolic CHF last BJ47-82% in 07/2016; who presents after having a fall at home. History is elicited from the help of friends present at bedside who help look after the patient. He has no recollection of the events that happened. The fall occurred sometime between 2:30 PM and 5:30 PM when his daughter called and could not get him to answer. Patient was found on the floor of his home. He just completed 10 rounds of radiation at the end of last year and had his first round of chemotherapy approximately 3 weeks ago. Following chemotherapy patient has had progressive decline with generalized weakness. The reported associated symptoms of decreased appetite, poor fluid intake, lower extremity swelling, intermittent confusion, and productive cough. They report his cough is chronic and unchanged from baseline. At baseline patient does not use a cane or walker to ambulate and had continued to be able to drive. Patient denies any chest pain, hemoptysis, or shortness of breath at this time.  Assessment & Plan:   Fall with subarachnoid hemorrhage: Acute. Patient apparently fell and was down for some unknown period in time. Initial CT of brain showed a small left frontal subarachnoid hemorrhage. Neurosurgery was consulted by the ED physician and recommended holding Eliquis and to monitor. - repeated CT Head 1/22 small SAH much less apparent and no acute findings - Transfer out of ICU to telemetry bed - I had a discussion with cardiologist Dr. Oval Linsey about eliquis and he likely needs to be off of it for at least a month or longer pending neurosurgery recommendations.  He is at higher risk of  bleed from the [facility] versus his risk of stroke from Af[facility].  A follow up in 1 month will be arranged for him with his personal cardiologist Dr. Domenic Polite, she says.   SIRS - resolved now: Sepsis Ruled Out. Patient presented tachycardic, tachypneic, with leukopenia, and elevated lactic acid of 3.46. - Follow-up blood/urine cultures - lactic acid normalized now - Initially placed on vancomycin and Zosyn but now discontinued  Influenza - Pt on 5 days of tamiflu, droplet precautions, supportive care  Transient Hypotension: Initial blood pressure noted to be as low as 90/50. With improvement after restarting IV fluids - IVFs stop this morning.   Hypokalemia - repleted  Lactic acidosis: Likely secondary to dehydration, resolved now  Dehydration - Held furosemide, can restart 1/24 - Treated with IVFs  Metastatic adenocarcinoma of the lung stage IV - per Dr. Earlie Server And Dr. Tammi Klippel  Systolic heart failure last EF noted to be 45-50%. - Continue to monitor to avoid fluid overload  Atrial fibrillation on Elqiuis - Eliquis Held due to [facility] - Continue metoprolol, as tolerated  Essential hypertension - Continue Cozaar and medications as seen above   Diabetes mellitus type 2 - Hypoglycemic protocols - Held glipizide and Janumet - SSI  CBG (last 3)   Recent Labs  12/19/16 1221 12/19/16 1744 12/19/16 2124  GLUCAP 100* 104* 88   Diarrhea - resolved now  DVT prophylaxis: SCDs  Code Status: DNR  Disposition Plan: TBD Consults called: Neurosurgery Admission status: Inpatient   Subjective: Pt reports that he has a dry cough but otherwise no complaints  Objective: Vitals:  12/20/16 0100 12/20/16 0200 12/20/16 0357 12/20/16 0400  BP: (!) 160/82 (!) 153/65  (!) 156/77  Pulse: 82 85  99  Resp: 15 (!) 22  (!) 26  Temp:   98.1 F (36.7 C)   TempSrc:   Oral   SpO2: 98% 97%  97%  Weight:      Height:        Intake/Output Summary (Last 24 hours) at 12/20/16 0743 Last  data filed at 12/20/16 0400  Gross per 24 hour  Intake          1576.33 ml  Output             1275 ml  Net           301.33 ml   Filed Weights   12/18/16 2016 12/19/16 0223  Weight: 103.9 kg (229 lb) 98.7 kg (217 lb 9.5 oz)    Exam:  General exam: awake, alert, NAD, cooperative.  Respiratory system:  No increased work of breathing. Cardiovascular system: S1 & S2 heard. No JVD, murmurs, gallops, clicks or pedal edema. Gastrointestinal system: Abdomen is nondistended, soft and nontender. Normal bowel sounds heard. Central nervous system: Alert and oriented. No focal neurological deficits. Extremities: no cyanosis. 1+ to 2+ bilateral LE edema.  Data Reviewed: Basic Metabolic Panel:  Recent Labs Lab 12/15/16 1037 12/18/16 2107 12/18/16 2155 12/19/16 0553 12/20/16 0337  NA 136 134*  --  135 133*  K 4.0 3.8  --  3.3* 4.0  CL 100* 99*  --  104 103  CO2 26 24  --  23 24  GLUCOSE 144* 83  --  82 83  BUN 18 24*  --  19 13  CREATININE 0.69 0.76  --  0.55* 0.52*  CALCIUM 9.9 9.6  --  8.4* 8.4*  MG  --   --  1.6*  --   --    Liver Function Tests:  Recent Labs Lab 12/15/16 1037 12/18/16 2107  AST 21 52*  ALT 21 30  ALKPHOS 62 63  BILITOT 0.5 0.6  PROT 7.0 6.6  ALBUMIN 3.5 3.4*   No results for input(s): LIPASE, AMYLASE in the last 168 hours. No results for input(s): AMMONIA in the last 168 hours. CBC:  Recent Labs Lab 12/15/16 1037 12/18/16 2107 12/19/16 0553 12/20/16 0337  WBC 2.9* 1.6* 1.6* 1.8*  NEUTROABS 1.4* 0.8*  --   --   HGB 11.4* 11.1* 9.7* 10.2*  HCT 36.3* 34.2* 29.6* 31.2*  MCV 78.6 75.5* 74.2* 74.5*  PLT 101* 204 182 199   Cardiac Enzymes:  Recent Labs Lab 12/18/16 2155  CKTOTAL 207   CBG (last 3)   Recent Labs  12/19/16 1221 12/19/16 1744 12/19/16 2124  GLUCAP 100* 104* 88   Recent Results (from the past 240 hour(s))  MRSA PCR Screening     Status: None   Collection Time: 12/19/16  3:09 AM  Result Value Ref Range Status   MRSA  by PCR NEGATIVE NEGATIVE Final    Comment:        The GeneXpert MRSA Assay (FDA approved for NASAL specimens only), is one component of a comprehensive MRSA colonization surveillance program. It is not intended to diagnose MRSA infection nor to guide or monitor treatment for MRSA infections.      Studies: Dg Chest 2 View  Result Date: 12/18/2016 CLINICAL DATA:  Cough. Fall today. Undergoing chemotherapy for lung cancer. EXAM: CHEST  2 VIEW COMPARISON:  Chest radiographs and CT 09/30/2016 FINDINGS: Cardiac  silhouette remains mildly enlarged. Aortic atherosclerosis is noted. Mediastinal contours are unchanged. Brachia therapy seeds are again seen associated with an anterior right upper lobe mass. The mass itself is less well seen compared to the prior radiographs, potentially indicating a reduction in size. There is a persistent small right pleural effusion with similar appearance of patchy basilar right lung opacity. There may be a trace left pleural effusion. The left lung is grossly clear. No acute osseous abnormality is seen. IMPRESSION: 1. Anterior right upper lobe mass, potentially decreased in size though not well of elevated by radiographs. 2. Persistent small right pleural effusion. 3. Unchanged right basilar opacity which may reflect atelectasis. 4. Aortic atherosclerosis. Electronically Signed   By: Logan Bores M.D.   On: 12/18/2016 21:31   Ct Head Wo Contrast  Result Date: 12/19/2016 CLINICAL DATA:  Acute onset of generalized weakness. Current history of metastatic lung adenocarcinoma. Initial encounter. EXAM: CT HEAD WITHOUT CONTRAST TECHNIQUE: Contiguous axial images were obtained from the base of the skull through the vertex without intravenous contrast. COMPARISON:  CT of the head performed 12/18/2016 FINDINGS: Brain: No evidence of acute infarction, hydrocephalus, extra-axial collection or mass lesion/mass effect. The previously noted small focus of acute subarachnoid hemorrhage  at the left frontoparietal region is less apparent than on the prior study. Prominence of the ventricles and sulci reflects mild to moderate cortical volume loss. Mild cerebellar atrophy is noted. Scattered periventricular white matter change likely reflects small vessel ischemic microangiopathy. The brainstem and fourth ventricle are within normal limits. The basal ganglia are unremarkable in appearance. The cerebral hemispheres demonstrate grossly normal gray-white differentiation. No mass effect or midline shift is seen. Vascular: No hyperdense vessel or unexpected calcification. Skull: There is no evidence of fracture; visualized osseous structures are unremarkable in appearance. Sinuses/Orbits: The orbits are within normal limits. Mild mucosal thickening is noted at the maxillary sinuses bilaterally. The remaining paranasal sinuses and mastoid air cells are well-aerated. Other: No significant soft tissue abnormalities are seen. IMPRESSION: 1. No acute intracranial pathology seen on CT. 2. Previously noted small focus of acute subarachnoid hemorrhage at the left frontoparietal region is less apparent than on the prior study. 3. Mild to moderate cortical volume loss and scattered small vessel ischemic microangiopathy. 4. Mild mucosal thickening at the maxillary sinuses bilaterally. Electronically Signed   By: Garald Balding M.D.   On: 12/19/2016 22:44   Ct Head Wo Contrast  Result Date: 12/18/2016 CLINICAL DATA:  83 year old male with fall. History of metastatic lung cancer. EXAM: CT HEAD WITHOUT CONTRAST CT CERVICAL SPINE WITHOUT CONTRAST TECHNIQUE: Multidetector CT imaging of the head and cervical spine was performed following the standard protocol without intravenous contrast. Multiplanar CT image reconstructions of the cervical spine were also generated. COMPARISON:  Brain MRI dated 11/14/2016 FINDINGS: CT HEAD FINDINGS Brain: There is a small left frontal subarachnoid hemorrhage (series 13 image 27,  sagittal series 6, image 50, and coronal series 5, image 42). No other acute intracranial hemorrhage identified. No mass effect or midline shift. The ventricles and sulci appropriate size for patient's age. Mild periventricular and deep white matter chronic microvascular ischemic changes noted. The previously seen left posterior thalamic metastatic lesion is not well visualized on this noncontrast CT. No mass effect or midline shift noted. No intra-axial fluid collection. Vascular: No hyperdense vessel or unexpected calcification. Skull: Normal. Negative for fracture or focal lesion. Sinuses/Orbits: Mild mucoperiosteal thickening of paranasal sinuses. No air-fluid levels. The mastoid air cells are clear. Bilateral cataract surgeries noted.  Mild right periorbital contusion. Other: None CT CERVICAL SPINE FINDINGS Evaluation of this exam is limited due to motion artifact. Alignment: Normal. Skull base and vertebrae: No acute fracture. Slight irregular appearance of the tip of the odontoid process on the coronal images appears artifactual. No primary bone lesion or focal pathologic process. Soft tissues and spinal canal: No prevertebral fluid or swelling. No visible canal hematoma. Disc levels:  Degenerative changes primarily at C5-C6 and C6-C7. Upper chest: The visualized lung apices are clear. Other: Bilateral carotid bulb atherosclerotic plaques. IMPRESSION: Small left frontal subarachnoid hemorrhage.  Follow-up recommended. Mild age-related atrophy and chronic microvascular ischemic changes. No acute/traumatic cervical spine pathology. These results were called by telephone at the time of interpretation on 12/18/2016 at 10:55 pm to Dr. Brantley Stage , who verbally acknowledged these results. Electronically Signed   By: Anner Crete M.D.   On: 12/18/2016 22:59   Ct Cervical Spine Wo Contrast  Result Date: 12/18/2016 CLINICAL DATA:  83 year old male with fall. History of metastatic lung cancer. EXAM: CT HEAD WITHOUT  CONTRAST CT CERVICAL SPINE WITHOUT CONTRAST TECHNIQUE: Multidetector CT imaging of the head and cervical spine was performed following the standard protocol without intravenous contrast. Multiplanar CT image reconstructions of the cervical spine were also generated. COMPARISON:  Brain MRI dated 11/14/2016 FINDINGS: CT HEAD FINDINGS Brain: There is a small left frontal subarachnoid hemorrhage (series 13 image 27, sagittal series 6, image 50, and coronal series 5, image 42). No other acute intracranial hemorrhage identified. No mass effect or midline shift. The ventricles and sulci appropriate size for patient's age. Mild periventricular and deep white matter chronic microvascular ischemic changes noted. The previously seen left posterior thalamic metastatic lesion is not well visualized on this noncontrast CT. No mass effect or midline shift noted. No intra-axial fluid collection. Vascular: No hyperdense vessel or unexpected calcification. Skull: Normal. Negative for fracture or focal lesion. Sinuses/Orbits: Mild mucoperiosteal thickening of paranasal sinuses. No air-fluid levels. The mastoid air cells are clear. Bilateral cataract surgeries noted. Mild right periorbital contusion. Other: None CT CERVICAL SPINE FINDINGS Evaluation of this exam is limited due to motion artifact. Alignment: Normal. Skull base and vertebrae: No acute fracture. Slight irregular appearance of the tip of the odontoid process on the coronal images appears artifactual. No primary bone lesion or focal pathologic process. Soft tissues and spinal canal: No prevertebral fluid or swelling. No visible canal hematoma. Disc levels:  Degenerative changes primarily at C5-C6 and C6-C7. Upper chest: The visualized lung apices are clear. Other: Bilateral carotid bulb atherosclerotic plaques. IMPRESSION: Small left frontal subarachnoid hemorrhage.  Follow-up recommended. Mild age-related atrophy and chronic microvascular ischemic changes. No  acute/traumatic cervical spine pathology. These results were called by telephone at the time of interpretation on 12/18/2016 at 10:55 pm to Dr. Brantley Stage , who verbally acknowledged these results. Electronically Signed   By: Anner Crete M.D.   On: 12/18/2016 22:59   Scheduled Meds: . alfuzosin  10 mg Oral Daily  . folic acid  1 mg Oral Daily  . guaiFENesin  600 mg Oral BID  . insulin aspart  0-9 Units Subcutaneous TID WC  . losartan  100 mg Oral Daily  . metoprolol  50 mg Oral BID  . oseltamivir  75 mg Oral BID  . oxybutynin  10 mg Oral QHS  . simvastatin  40 mg Oral QHS   Continuous Infusions: . 0.9 % NaCl with KCl 20 mEq / L 70 mL/hr at 12/20/16 0400    Principal  Problem:   SAH (subarachnoid hemorrhage) (HCC) Active Problems:   Atrial fibrillation with rapid ventricular response (HCC)   DM type 2 (diabetes mellitus, type 2) ([place])   Palliative care by specialist   Malignant neoplasm of right lung ([place])   SIRS (systemic inflammatory response syndrome) ([place])   Fall  Critical Care Time spent: 44 mins  Irwin Brakeman, MD, FAAFP [facility] Pager [phone]  If 7PM-7AM, please contact night-coverage [email or web address] Password [email or web address] 12/20/2016, 7:43 AM    LOS: 1 day

## 2016-12-21 ENCOUNTER — Ambulatory Visit: Payer: Medicare Other

## 2016-12-21 ENCOUNTER — Ambulatory Visit: Payer: Medicare Other | Admitting: Internal Medicine

## 2016-12-21 ENCOUNTER — Other Ambulatory Visit: Payer: Medicare Other

## 2016-12-21 DIAGNOSIS — A419 Sepsis, unspecified organism: Secondary | ICD-10-CM

## 2016-12-21 LAB — BASIC METABOLIC PANEL
ANION GAP: 6 (ref 5–15)
BUN: 10 mg/dL (ref 6–20)
CALCIUM: 8.5 mg/dL — AB (ref 8.9–10.3)
CO2: 26 mmol/L (ref 22–32)
Chloride: 102 mmol/L (ref 101–111)
Creatinine, Ser: 0.53 mg/dL — ABNORMAL LOW (ref 0.61–1.24)
GFR calc Af Amer: >60 mL/min (ref >60)
GFR calc non Af Amer: >60 mL/min (ref >60)
GLUCOSE: 136 mg/dL — AB (ref 65–99)
Potassium: 3.9 mmol/L (ref 3.5–5.1)
Sodium: 134 mmol/L — ABNORMAL LOW (ref 135–145)

## 2016-12-21 LAB — CBC
HEMATOCRIT: 30.5 % — AB (ref 39.0–52.0)
Hemoglobin: 10.1 g/dL — ABNORMAL LOW (ref 13.0–17.0)
MCH: 24.9 pg — AB (ref 26.0–34.0)
MCHC: 33.1 g/dL (ref 30.0–36.0)
MCV: 75.3 fL — AB (ref 78.0–100.0)
Platelets: 196 10*3/uL (ref 150–400)
RBC: 4.05 MIL/uL — ABNORMAL LOW (ref 4.22–5.81)
RDW: 18.3 % — AB (ref 11.5–15.5)
WBC: 1.7 10*3/uL — AB (ref 4.0–10.5)

## 2016-12-21 LAB — GLUCOSE, CAPILLARY
GLUCOSE-CAPILLARY: 167 mg/dL — AB (ref 65–99)
GLUCOSE-CAPILLARY: 200 mg/dL — AB (ref 65–99)
Glucose-Capillary: 139 mg/dL — ABNORMAL HIGH (ref 65–99)
Glucose-Capillary: 183 mg/dL — ABNORMAL HIGH (ref 65–99)

## 2016-12-21 LAB — MAGNESIUM: Magnesium: 2.1 mg/dL (ref 1.7–2.4)

## 2016-12-21 NOTE — Evaluation (Signed)
Physical Therapy Evaluation Patient Details Name: [name] MRN: [id number] DOB: Aug 29, 1927 Today's Date: 12/21/2016   History of Present Illness  [name] is a 83 y.o. male with medical history significant of stage IV adenocarcinoma of the right lung s/p radiation and chemotherapy, systolic CHF last EF 18-56% in 07/2016; who presents after having a fall at home.   Initial CT of brain showed a small left frontal subarachnoid hemorrhage            Clinical Impression  Pt admitted with above diagnosis. Pt currently with functional limitations due to the deficits listed below (see PT Problem List).  Pt will benefit from skilled PT to increase their independence and safety with mobility to allow discharge to the venue listed below.    Would benefit from SNF but not agreeable per RN; pt will need 24hour assist at home at least initially; unable to amb today d/t BP dropping with standing/dizziness     Follow Up Recommendations Home health PT;SNF;Supervision/Assistance - 24 hour    Equipment Recommendations  None recommended by PT    Recommendations for Other Services       Precautions / Restrictions Precautions Precautions: Fall Precaution Comments: monitor BP/orhtostatic on eval Restrictions Weight Bearing Restrictions: No      Mobility  Bed Mobility               General bed mobility comments: EOB with NT  Transfers Overall transfer level: Needs assistance Equipment used: Rolling walker (2 wheeled) Transfers: Stand Pivot Transfers Sit to Stand: Min assist Stand pivot transfers: Min assist       General transfer comment: repeated x3 d/t dizziness, BP checks; BP in sitting 90/47, HR 81, BP standing-unable to remain standing long enough to get BP-->just after sitting BP 75/45, HR 102; multi-modal cues for safety, hand placement and control of descent   Ambulation/Gait             General Gait Details: unable d/t dizziness  Stairs             Wheelchair Mobility    Modified Rankin (Stroke Patients Only)       Balance Overall balance assessment: Needs assistance Sitting-balance support: Feet supported;No upper extremity supported Sitting balance-Leahy Scale: Fair       Standing balance-Leahy Scale: Poor Standing balance comment: dizzy, requires assist                             Pertinent Vitals/Pain Pain Assessment: No/denies pain    Home Living Family/patient expects to be discharged to:: Private residence Living Arrangements: Alone   Type of Home: House       Home Layout: One level;Laundry or work area in [place]: Environmental consultant - 2 wheels Additional Comments: pt only goes down stairs to [employer], he tossed laundry downstairs and carries the basket back up    Prior Function Level of Independence: Independent         Comments: pt reports he has friends that check on him and has a dtr that lives in [place], [place] that can come stay with him     Hand Dominance        Extremity/Trunk Assessment   Upper Extremity Assessment Upper Extremity Assessment: Generalized weakness    Lower Extremity Assessment Lower Extremity Assessment: Generalized weakness       Communication   Communication: No difficulties  Cognition Arousal/Alertness: Awake/alert Behavior During Therapy: [facility] for  tasks assessed/performed Overall Cognitive Status: Within Functional Limits for tasks assessed                      General Comments      Exercises     Assessment/Plan    PT Assessment Patient needs continued PT services  PT Problem List Decreased strength;Decreased activity tolerance;Decreased balance;Decreased mobility;Decreased knowledge of use of DME          PT Treatment Interventions DME instruction;Gait training;Functional mobility training;Therapeutic activities;Patient/family education;Therapeutic exercise    PT Goals (Current goals can be found in the Care Plan  section)  Acute Rehab PT Goals Patient Stated Goal: home PT Goal Formulation: With patient Time For Goal Achievement: 12/28/16 Potential to Achieve Goals: Good    Frequency Min 3X/week   Barriers to discharge        Co-evaluation               End of Session Equipment Utilized During Treatment: Gait belt Activity Tolerance: Patient limited by fatigue;Treatment limited secondary to medical complications (Comment) (BP dropping) Patient left: in chair;with call bell/phone within reach;with chair alarm set Nurse Communication: Other (comment) (BP)         Time: [id number] PT Time Calculation (min) (ACUTE ONLY): 27 min   Charges:   PT Evaluation $PT Eval Moderate Complexity: 1 Procedure PT Treatments $Therapeutic Activity: 8-22 mins   PT G Codes:        [name] 01-15-2017, 11:27 AM

## 2016-12-21 NOTE — NC FL2 (Signed)
[place] LEVEL OF CARE SCREENING TOOL     IDENTIFICATION  Patient Name: [name] Birthdate: 05-29-1927 Sex: male Admission Date (Current Location): 12/18/2016  [facility] and [place] Number:  Engineer, manufacturing systems and Address:  [facility],  [place] [address] [place]      Provider Number: [id number]  Attending Physician Name and Address:  Debbe Odea, MD  Relative Name and Phone Number:       Current Level of Care: Hospital Recommended Level of Care: [place] Prior Approval Number:    Date Approved/Denied:   PASRR Number: [id number]  Discharge Plan: SNF    Current Diagnoses: Patient Active Problem List   Diagnosis Date Noted  . Sepsis ([place]) 12/21/2016  . SAH (subarachnoid hemorrhage) ([place]) 12/19/2016  . Fall 12/19/2016  . Encounter for antineoplastic chemotherapy 12/07/2016  . Bronchitis 11/23/2016  . Peripheral edema 11/23/2016  . Goals of care, counseling/discussion 11/17/2016  . 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 Palliative care by specialist   . DNR (do not resuscitate)   . Malignant neoplasm of right lung ([place])   . Adenocarcinoma of right lung, stage 4 ([place]) 10/13/2016  . Shortness of breath at rest 10/13/2016  . Atrial fibrillation with rapid ventricular response ([place]) 08/25/2016  . Chest pain 08/25/2016  . DM type 2 (diabetes mellitus, type 2) ([place]) 08/25/2016  . Aortic atherosclerosis ([place]) 08/25/2016  . Thoracic aortic aneurysm ([place]) 08/25/2016  . Hx of cancer of lung 11/07/2011  . Hypercholesteremia   . History of kidney stones     Orientation RESPIRATION BLADDER Height & Weight     Self, Time, Situation, Place  Normal Continent Weight: 217 lb 9.5 oz (98.7 kg) Height:  '5\' 9"'$  (175.3 cm)  BEHAVIORAL SYMPTOMS/MOOD NEUROLOGICAL BOWEL NUTRITION STATUS      Continent Diet (Heart Healthy/Carb Modified)  AMBULATORY STATUS COMMUNICATION OF NEEDS Skin   Extensive Assist  Verbally Surgical wounds (Wound / Incision (Open or Dehisced) 12/19/16 Other (Comment) Buttocks Left 1.5cm cut in skin)                       Personal Care Assistance Level of Assistance  Bathing, Dressing Bathing Assistance: Limited assistance   Dressing Assistance: Limited assistance     Functional Limitations Info             [place]  PT (By licensed PT), OT (By licensed OT)     PT Frequency: 5 OT Frequency: 5            Contractures      Additional Factors Info  Code Status, Allergies, Isolation Precautions Code Status Info: DNR Allergies Info: Sulfa Antibiotics     Isolation Precautions Info: Droplet Precautions - positive for Influenza B 12/18/16     Current Medications (12/21/2016):  This is the current hospital active medication list Current Facility-Administered Medications  Medication Dose Route Frequency Provider Last Rate Last Dose  . acetaminophen (TYLENOL) tablet 650 mg  650 mg Oral Q6H PRN Norval Morton, MD       Or  . acetaminophen (TYLENOL) suppository 650 mg  650 mg Rectal Q6H PRN Norval Morton, MD      . albuterol (PROVENTIL) (2.5 MG/3ML) 0.083% nebulizer solution 2.5 mg  2.5 mg Nebulization Q2H PRN Norval Morton, MD      . alfuzosin (UROXATRAL) 24 hr tablet 10 mg  10 mg Oral Daily Rondell A  Tamala Julian, MD   10 mg at 12/21/16 0816  . amLODipine (NORVASC) tablet 5 mg  5 mg Oral Daily [place] Marisa Hua, MD   5 mg at 12/21/16 0816  . atorvastatin (LIPITOR) tablet 20 mg  20 mg Oral QHS Clanford Marisa Hua, MD   20 mg at 12/20/16 2114  . folic acid (FOLVITE) tablet 1 mg  1 mg Oral Daily Rondell A Tamala Julian, MD   1 mg at 12/21/16 0815  . furosemide (LASIX) tablet 40 mg  40 mg Oral Daily Clan[place] Marisa Hua, MD   40 mg at 12/21/16 0815  . guaiFENesin-dextromethorphan (ROBITUSSIN DM) 100-10 MG/5ML syrup 5 mL  5 mL Oral Q4H PRN [place] Marisa Hua, MD   5 mL at 12/20/16 1200  . insulin aspart (novoLOG) injection 0-9 Units  0-9 Units  Subcutaneous TID WC Norval Morton, MD   2 Units at 12/21/16 1155  . LORazepam (ATIVAN) tablet 0.5 mg  0.5 mg Oral Q8H PRN Norval Morton, MD      . losartan (COZAAR) tablet 100 mg  100 mg Oral Daily Norval Morton, MD   100 mg at 12/21/16 0815  . metoprolol tartrate (LOPRESSOR) tablet 50 mg  50 mg Oral BID Norval Morton, MD   50 mg at 12/21/16 0816  . ondansetron (ZOFRAN) tablet 4 mg  4 mg Oral Q6H PRN Norval Morton, MD       Or  . ondansetron (ZOFRAN) injection 4 mg  4 mg Intravenous Q6H PRN Norval Morton, MD      . oseltamivir (TAMIFLU) capsule 75 mg  75 mg Oral BID Clanford Marisa Hua, MD   75 mg at 12/21/16 0816  . oxybutynin (DITROPAN-XL) 24 hr tablet 10 mg  10 mg Oral QHS Norval Morton, MD   10 mg at 12/20/16 2114  . potassium chloride (K-DUR) CR tablet 10 mEq  10 mEq Oral Daily Clanford Marisa Hua, MD   10 mEq at 12/21/16 6122     Discharge Medications: Please see discharge summary for a list of discharge medications.  Relevant Imaging Results:  Relevant Lab Results:   Additional Information SSN: [id number]  Standley Brooking, LCSW

## 2016-12-21 NOTE — Progress Notes (Signed)
PROGRESS NOTE    MICHAELANGELO MITTELMAN   OVF:[id number]  DOB: 1927-06-15  DOA: 12/18/2016 PCP: Purvis Kilts, MD   Brief Narrative:  Greig Altergott Haskinsis a 83 y.o.malewith medical history significant of stage IV adenocarcinoma of the right lung s/p radiation(followed by Dr. Tammi Klippel) and chemotherapy (followed by Dr. Earlie Server), systolic CHF last AC16-60% in 07/2016; who presents after having a fall at home. History is elicited from the help of friends present at bedside who help look after the patient. He has no recollection of the events that happened. The fall occurred sometime between 2:30 PM and 5:30 PM when his daughter called and could not get him to answer. Patient was found on the floor of his home. He just completed 10 rounds of radiation at the end of last year and had his first round of chemotherapy approximately 3 weeks ago. Following chemotherapy patient has had progressive decline with generalized weakness. The reported associated symptoms of decreased appetite, poor fluid intake, lower extremity swelling, intermittent confusion, and productive cough. They report his cough is chronic and unchanged from baseline. At baseline patient does not use a cane or walker to ambulate and had continued to be able to drive. Patient denies any chest pain, hemoptysis, or shortness of breath at this time.  Subjective: Cough and congestion. No dyspnea.   Assessment & Plan:  Fall with L frontal subarachnoid hemorrhage: Patient apparently fell and was down for some unknown period in time. Initial CT of brain showed a small left frontal subarachnoid hemorrhage. Neurosurgery was consulted by the ED physician and recommended holding Eliquisand to monitor. - repeated CT Head 1/22 small SAH much less apparent and no acute findings -Dr Wynetta Emery discussion with cardiologist Dr. Oval Linsey about eliquis and he likely needs to be off of it for at least a month or longer pending neurosurgery recommendations.  He is  at higher risk of bleed from the [facility] versus his risk of stroke from Afib.  A follow up in 1 month will be arranged for him with his personal cardiologist Dr. Domenic Polite, she says.   Influenza +  - Pt on 5 days of tamiflu, droplet precautions, supportive care  Severe Sepsis- due to above  - tachycardic, tachypneic, with leukopenia, and elevated lactic acid of 3.46. - blood/urine cultures negative - lactic acid normalized now - Initially placed on vancomycin and Zosyn but now discontinued  Hypokalemia  - repleted  Dehydration- fall due to orthostatic hypotension Systolic heart failure last EFnoted to be 45-50%. - Hold furosemide - Continue to monitor to avoid fluid overload   Metastatic adenocarcinoma of the lung stage IV - per Dr. Earlie Server And Dr. Tammi Klippel  Atrial fibrillation on Elqiuis - Eliquis Held due to [facility] - Continue metoprolol, as tolerated  Essential hypertension - Continue Cozaar and medications as seen above   Diabetes mellitus type 2 - Held glipizide and Janumet - SSI  Hematuria on UA on 1/21 - will repeat   DVT prophylaxis: SCDs Code Status: DNR Family Communication:  Disposition Plan: home when stable Consultants:   NS Procedures:    Antimicrobials:  Anti-infectives    Start     Dose/Rate Route Frequency Ordered Stop   12/19/16 1200  vancomycin (VANCOCIN) 1,250 mg in sodium chloride 0.9 % 250 mL IVPB  Status:  Discontinued     1,250 mg 166.7 mL/hr over 90 Minutes Intravenous Every 12 hours 12/19/16 0438 12/19/16 0752   12/19/16 1000  oseltamivir (TAMIFLU) capsule 75 mg     75  mg Oral 2 times daily 12/19/16 0753 12/24/16 0959   12/19/16 0800  piperacillin-tazobactam (ZOSYN) IVPB 3.375 g  Status:  Discontinued     3.375 g 12.5 mL/hr over 240 Minutes Intravenous Every 8 hours 12/19/16 0438 12/19/16 0752   12/19/16 0015  vancomycin (VANCOCIN) IVPB 1000 mg/200 mL premix     1,000 mg 200 mL/hr over 60 Minutes Intravenous  Once 12/19/16 0011  12/19/16 1408   12/19/16 0015  piperacillin-tazobactam (ZOSYN) IVPB 3.375 g     3.375 g 12.5 mL/hr over 240 Minutes Intravenous  Once 12/19/16 0011 12/19/16 0630       Objective: Vitals:   12/20/16 2000 12/20/16 2114 12/20/16 2217 12/21/16 0536  BP: 120/67 (!) 133/53  (!) 148/75  Pulse:  (!) 124 (!) 108 88  Resp:  (!) 24  (!) 22  Temp:  98.1 F (36.7 C)  98.3 F (36.8 C)  TempSrc:  Oral  Oral  SpO2:  97%  98%  Weight:      Height:        Intake/Output Summary (Last 24 hours) at 12/21/16 1413 Last data filed at 12/21/16 1001  Gross per 24 hour  Intake                0 ml  Output              650 ml  Net             -650 ml   Filed Weights   12/18/16 2016 12/19/16 0223  Weight: 103.9 kg (229 lb) 98.7 kg (217 lb 9.5 oz)    Examination: General exam: Appears comfortable  HEENT: PERRLA, oral mucosa moist, no sclera icterus or thrush Respiratory system: + rhonci Respiratory effort normal. Cardiovascular system: S1 & S2 heard, RRR.  No murmurs  Gastrointestinal system: Abdomen soft, non-tender, nondistended. Normal bowel sound. No organomegaly Central nervous system: Alert and oriented. No focal neurological deficits. Extremities: No cyanosis, clubbing or edema Skin: No rashes or ulcers Psychiatry:  Mood & affect appropriate.     Data Reviewed: I have personally reviewed following labs and imaging studies  CBC:  Recent Labs Lab 12/15/16 1037 12/18/16 2107 12/19/16 0553 12/20/16 0337 12/21/16 0455  WBC 2.9* 1.6* 1.6* 1.8* 1.7*  NEUTROABS 1.4* 0.8*  --   --   --   HGB 11.4* 11.1* 9.7* 10.2* 10.1*  HCT 36.3* 34.2* 29.6* 31.2* 30.5*  MCV 78.6 75.5* 74.2* 74.5* 75.3*  PLT 101* 204 182 199 951   Basic Metabolic Panel:  Recent Labs Lab 12/18/16 2107 12/18/16 2155 12/19/16 0553 12/20/16 0337 12/20/16 2126 12/21/16 0455  NA 134*  --  135 133* 131* 134*  K 3.8  --  3.3* 4.0 3.3* 3.9  CL 99*  --  104 103 101 102  CO2 24  --  '23 24 22 26  '$ GLUCOSE 83  --   82 83 261* 136*  BUN 24*  --  '19 13 13 10  '$ CREATININE 0.76  --  0.55* 0.52* 0.59* 0.53*  CALCIUM 9.6  --  8.4* 8.4* 8.1* 8.5*  MG  --  1.6*  --   --  1.6* 2.1   GFR: Estimated Creatinine Clearance: 75.4 mL/min (by C-G formula based on SCr of 0.53 mg/dL (L)). Liver Function Tests:  Recent Labs Lab 12/15/16 1037 12/18/16 2107  AST 21 52*  ALT 21 30  ALKPHOS 62 63  BILITOT 0.5 0.6  PROT 7.0 6.6  ALBUMIN 3.5 3.4*  No results for input(s): LIPASE, AMYLASE in the last 168 hours. No results for input(s): AMMONIA in the last 168 hours. Coagulation Profile:  Recent Labs Lab 12/18/16 2155  INR 1.29   Cardiac Enzymes:  Recent Labs Lab 12/18/16 2155  CKTOTAL 207   BNP (last 3 results) No results for input(s): PROBNP in the last 8760 hours. HbA1C: No results for input(s): HGBA1C in the last 72 hours. CBG:  Recent Labs Lab 12/20/16 1147 12/20/16 1533 12/20/16 2242 12/21/16 0749 12/21/16 1131  GLUCAP 129* 125* 269* 139* 167*   Lipid Profile: No results for input(s): CHOL, HDL, LDLCALC, TRIG, CHOLHDL, LDLDIRECT in the last 72 hours. Thyroid Function Tests: No results for input(s): TSH, T4TOTAL, FREET4, T3FREE, THYROIDAB in the last 72 hours. Anemia Panel: No results for input(s): VITAMINB12, FOLATE, FERRITIN, TIBC, IRON, RETICCTPCT in the last 72 hours. Urine analysis:    Component Value Date/Time   COLORURINE YELLOW 12/18/2016 2317   APPEARANCEUR HAZY (A) 12/18/2016 2317   LABSPEC 1.016 12/18/2016 2317   PHURINE 5.0 12/18/2016 2317   GLUCOSEU NEGATIVE 12/18/2016 2317   HGBUR LARGE (A) 12/18/2016 2317   BILIRUBINUR NEGATIVE 12/18/2016 [place] 12/18/2016 2317   PROTEINUR 30 (A) 12/18/2016 2317   NITRITE NEGATIVE 12/18/2016 2317   LEUKOCYTESUR TRACE (A) 12/18/2016 2317   Sepsis Labs: '@LABRCNTIP'$ (procalcitonin:4,lacticidven:4) ) Recent Results (from the past 240 hour(s))  Blood culture (routine x 2)     Status: None (Preliminary result)    Collection Time: 12/18/16 10:13 PM  Result Value Ref Range Status   Specimen Description BLOOD LEFT WRIST  Final   Special Requests IN PEDIATRIC BOTTLE 2CC  Final   Culture   Final    NO GROWTH 1 DAY Performed at [facility], [place] [address], [place], [place] [address]    Report Status PENDING  Incomplete  Blood culture (routine x 2)     Status: None (Preliminary result)   Collection Time: 12/18/16 10:17 PM  Result Value Ref Range Status   Specimen Description BLOOD RIGHT ANTECUBITAL  Final   Special Requests BOTTLES DRAWN AEROBIC AND ANAEROBIC 5CC  Final   Culture   Final    NO GROWTH 1 DAY Performed at [facility], [place] [address] [place], [place] [address]    Report Status PENDING  Incomplete  Urine culture     Status: None   Collection Time: 12/18/16 11:17 PM  Result Value Ref Range Status   Specimen Description URINE, CLEAN CATCH  Final   Special Requests NONE  Final   Culture   Final    NO GROWTH Performed at [facility], [place] [address] [place], [place] [address]    Report Status 12/20/2016 FINAL  Final  MRSA PCR Screening     Status: None   Collection Time: 12/19/16  3:09 AM  Result Value Ref Range Status   MRSA by PCR NEGATIVE NEGATIVE Final    Comment:        The GeneXpert MRSA Assay (FDA approved for NASAL specimens only), is one component of a comprehensive MRSA colonization surveillance program. It is not intended to diagnose MRSA infection nor to guide or monitor treatment for MRSA infections.          Radiology Studies: Ct Head Wo Contrast  Result Date: 12/19/2016 CLINICAL DATA:  Acute onset of generalized weakness. Current history of metastatic lung adenocarcinoma. Initial encounter. EXAM: CT HEAD WITHOUT CONTRAST TECHNIQUE: Contiguous axial images were obtained from the  base of the skull through the vertex without intravenous contrast. COMPARISON:  CT of the head performed 12/18/2016 FINDINGS: Brain: No evidence of acute  infarction, hydrocephalus, extra-axial collection or mass lesion/mass effect. The previously noted small focus of acute subarachnoid hemorrhage at the left frontoparietal region is less apparent than on the prior study. Prominence of the ventricles and sulci reflects mild to moderate cortical volume loss. Mild cerebellar atrophy is noted. Scattered periventricular white matter change likely reflects small vessel ischemic microangiopathy. The brainstem and fourth ventricle are within normal limits. The basal ganglia are unremarkable in appearance. The cerebral hemispheres demonstrate grossly normal gray-white differentiation. No mass effect or midline shift is seen. Vascular: No hyperdense vessel or unexpected calcification. Skull: There is no evidence of fracture; visualized osseous structures are unremarkable in appearance. Sinuses/Orbits: The orbits are within normal limits. Mild mucosal thickening is noted at the maxillary sinuses bilaterally. The remaining paranasal sinuses and mastoid air cells are well-aerated. Other: No significant soft tissue abnormalities are seen. IMPRESSION: 1. No acute intracranial pathology seen on CT. 2. Previously noted small focus of acute subarachnoid hemorrhage at the left frontoparietal region is less apparent than on the prior study. 3. Mild to moderate cortical volume loss and scattered small vessel ischemic microangiopathy. 4. Mild mucosal thickening at the maxillary sinuses bilaterally. Electronically Signed   By: Garald Balding M.D.   On: 12/19/2016 22:44   Dg Chest Port 1 View  Result Date: 12/20/2016 CLINICAL DATA:  Cough, weakness. EXAM: PORTABLE CHEST 1 VIEW COMPARISON:  Radiographs of December 18, 2016. FINDINGS: Stable cardiomediastinal silhouette. Atherosclerosis of thoracic aorta is noted. No pneumothorax is noted. Left lung is clear. Mild right basilar subsegmental atelectasis or infiltrate is noted with minimal associated pleural effusion. Stable presence of  brachytherapy seeds are again noted in right upper lobe. IMPRESSION: Aortic atherosclerosis. Mild right basilar subsegmental atelectasis or infiltrate with minimal associated pleural effusion. Electronically Signed   By: Marijo Conception, M.D.   On: 12/20/2016 09:09      Scheduled Meds: . alfuzosin  10 mg Oral Daily  . amLODipine  5 mg Oral Daily  . atorvastatin  20 mg Oral QHS  . folic acid  1 mg Oral Daily  . furosemide  40 mg Oral Daily  . insulin aspart  0-9 Units Subcutaneous TID WC  . losartan  100 mg Oral Daily  . metoprolol  50 mg Oral BID  . oseltamivir  75 mg Oral BID  . oxybutynin  10 mg Oral QHS  . potassium chloride  10 mEq Oral Daily   Continuous Infusions:   LOS: 2 days    Time spent in minutes: [place], MD [facility] Pager: [email or web address] Password [email or web address] 12/21/2016, 2:13 PM

## 2016-12-21 NOTE — Progress Notes (Signed)
CSW received message from [facility], [name] that patient is willing to explore SNFs at discharge. CSW spoke with patient who states that he has thought about it but has now decided that he would prefer to return home. RNCM, [name] made aware.   No further CSW needs identified - CSW signing off.   Raynaldo Opitz, [facility] Clinical Social Worker cell #: [phone]

## 2016-12-21 NOTE — Progress Notes (Signed)
Radiation Oncology         [phone] ________________________________  Name: [name] MRN: [id number]  Date: 12/18/2016  DOB: 02-Apr-1927  Post Treatment Note  CC: Purvis Kilts, MD  No ref. 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  Interval Since Last Radiation:  4 weeks   11/17/16 SRS Treatment:  Left thalamic 8 mm target was treated using 4 Dynamic Conformal Arcs to a prescription dose of 20 Gy.  ExacTrac registration was performed for each couch angle.  The 80% isodose line was prescribed.  6 MV X-rays were delivered in the flattening filter free beam mode.  11/02/16-11/15/16: 1.  The right upper lung was treated 30 Gy in 10 fractions of 3 Gy   Narrative:  The patient returns today for routine follin summary this is a pleasant gentleman diagnosed with stage IV lung cancer treated with radiotherapy in the palliative fashion to the right upper lobe, and subsequent to this also received radiation to a left thalamic metastasis in the brain. He is currently hospitalized after a fall which led to a subarachnoid hemorrhage, he is clinically improving, and was scheduled to see me today for follow-up after radiotherapy.                            On review of systems, the patient stathe feels as though he is doing pretty well. He denies any concerns with headaches, blurred vision or double vision. He denies any nausea, or auditory disturbances. No other complaints or verbalized.  ALLERGIES:  is allergic to sulfa antibiotics.  Meds: Current Facility-Administered Medications  Medication Dose Route Frequency Provider Last Rate Last Dose  . acetaminophen (TYLENOL) tablet 650 mg  650 mg Oral Q6H PRN Norval Morton, MD       Or  . acetaminophen (TYLENOL) suppository 650 mg  650 mg Rectal Q6H PRN Norval Morton, MD      . albuterol (PROVENTIL) (2.5 MG/3ML) 0.083% nebulizer  solution 2.5 mg  2.5 mg Nebulization Q2H PRN Norval Morton, MD      . alfuzosin (UROXATRAL) 24 hr tablet 10 mg  10 mg Oral Daily Norval Morton, MD   10 mg at 12/21/16 0816  . amLODipine (NORVASC) tablet 5 mg  5 mg Oral Daily Clanford Marisa Hua, MD   5 mg at 12/21/16 0816  . atorvastatin (LIPITOR) tablet 20 mg  20 mg Oral QHS Clanford Marisa Hua, MD   20 mg at 12/20/16 2114  . folic acid (FOLVITE) tablet 1 mg  1 mg Oral Daily Rondell A Tamala Julian, MD   1 mg at 12/21/16 0815  . furosemide (LASIX) tablet 40 mg  40 mg Oral Daily Clanford Marisa Hua, MD   40 mg at 12/21/16 0815  . guaiFENesin-dextromethorphan (ROBITUSSIN DM) 100-10 MG/5ML syrup 5 mL  5 mL Oral Q4H PRN Clanford Marisa Hua, MD   5 mL at 12/20/16 1200  . insulin aspart (novoLOG) injection 0-9 Units  0-9 Units Subcutaneous TID WC Norval Morton, MD   2 Units at 12/21/16 1155  . LORazepam (ATIVAN) tablet 0.5 mg  0.5 mg Oral Q8H PRN Norval Morton, MD      . losartan (COZAAR) tablet 100 mg  100 mg Oral Daily Norval Morton, MD   100 mg at 12/21/16 0815  . metoprolol tartrate (LOPRESSOR)  tablet 50 mg  50 mg Oral BID Norval Morton, MD   50 mg at 12/21/16 0816  . ondansetron (ZOFRAN) tablet 4 mg  4 mg Oral Q6H PRN Norval Morton, MD       Or  . ondansetron (ZOFRAN) injection 4 mg  4 mg Intravenous Q6H PRN Norval Morton, MD      . oseltamivir (TAMIFLU) capsule 75 mg  75 mg Oral BID Clanford Marisa Hua, MD   75 mg at 12/21/16 0816  . oxybutynin (DITROPAN-XL) 24 hr tablet 10 mg  10 mg Oral QHS Norval Morton, MD   10 mg at 12/20/16 2114  . potassium chloride (K-DUR) CR tablet 10 mEq  10 mEq Oral Daily Clanford Marisa Hua, MD   10 mEq at 12/21/16 0816    Physical Findings:  height is '5\' 9"'$  (1.753 m) and weight is 217 lb 9.5 oz (98.7 kg). His oral temperature is 97.6 F (36.4 C). His blood pressure is 118/68 and his pulse is 98. His respiration is 20 and oxygen saturation is 96%.  In general this is An elderly appearing Caucasian male in no  acute distress. He's alert and oriented x4 and appropriate throughout the examination. Cardiopulmonary assessment is negative for acute distress and he exhibits normal effort. Ecchymosis is noted over his right orbital region. No evidence of any lacerations are noted of his face, there are multiple lacerations of his right hand and extremity. These appear to be healing well.  Lab Findings: Lab Results  Component Value Date   WBC 1.7 (L) 12/21/2016   HGB 10.1 (L) 12/21/2016   HCT 30.5 (L) 12/21/2016   MCV 75.3 (L) 12/21/2016   PLT 196 12/21/2016     Radiographic Findings: Dg Chest 2 View  Result Date: 12/18/2016 CLINICAL DATA:  Cough. Fall today. Undergoing chemotherapy for lung cancer. EXAM: CHEST  2 VIEW COMPARISON:  Chest radiographs and CT 09/30/2016 FINDINGS: Cardiac silhouette remains mildly enlarged. Aortic atherosclerosis is noted. Mediastinal contours are unchanged. Brachia therapy seeds are again seen associated with an anterior right upper lobe mass. The mass itself is less well seen compared to the prior radiographs, potentially indicating a reduction in size. There is a persistent small right pleural effusion with similar appearance of patchy basilar right lung opacity. There may be a trace left pleural effusion. The left lung is grossly clear. No acute osseous abnormality is seen. IMPRESSION: 1. Anterior right upper lobe mass, potentially decreased in size though not well of elevated by radiographs. 2. Persistent small right pleural effusion. 3. Unchanged right basilar opacity which may reflect atelectasis. 4. Aortic atherosclerosis. Electronically Signed   By: Logan Bores M.D.   On: 12/18/2016 21:31   Ct Head Wo Contrast  Result Date: 12/19/2016 CLINICAL DATA:  Acute onset of generalized weakness. Current history of metastatic lung adenocarcinoma. Initial encounter. EXAM: CT HEAD WITHOUT CONTRAST TECHNIQUE: Contiguous axial images were obtained from the base of the skull through  the vertex without intravenous contrast. COMPARISON:  CT of the head performed 12/18/2016 FINDINGS: Brain: No evidence of acute infarction, hydrocephalus, extra-axial collection or mass lesion/mass effect. The previously noted small focus of acute subarachnoid hemorrhage at the left frontoparietal region is less apparent than on the prior study. Prominence of the ventricles and sulci reflects mild to moderate cortical volume loss. Mild cerebellar atrophy is noted. Scattered periventricular white matter change likely reflects small vessel ischemic microangiopathy. The brainstem and fourth ventricle are within normal limits. The basal ganglia are  unremarkable in appearance. The cerebral hemispheres demonstrate grossly normal gray-white differentiation. No mass effect or midline shift is seen. Vascular: No hyperdense vessel or unexpected calcification. Skull: There is no evidence of fracture; visualized osseous structures are unremarkable in appearance. Sinuses/Orbits: The orbits are within normal limits. Mild mucosal thickening is noted at the maxillary sinuses bilaterally. The remaining paranasal sinuses and mastoid air cells are well-aerated. Other: No significant soft tissue abnormalities are seen. IMPRESSION: 1. No acute intracranial pathology seen on CT. 2. Previously noted small focus of acute subarachnoid hemorrhage at the left frontoparietal region is less apparent than on the prior study. 3. Mild to moderate cortical volume loss and scattered small vessel ischemic microangiopathy. 4. Mild mucosal thickening at the maxillary sinuses bilaterally. Electronically Signed   By: Garald Balding M.D.   On: 12/19/2016 22:44   Ct Head Wo Contrast  Result Date: 12/18/2016 CLINICAL DATA:  83 year old male with fall. History of metastatic lung cancer. EXAM: CT HEAD WITHOUT CONTRAST CT CERVICAL SPINE WITHOUT CONTRAST TECHNIQUE: Multidetector CT imaging of the head and cervical spine was performed following the standard  protocol without intravenous contrast. Multiplanar CT image reconstructions of the cervical spine were also generated. COMPARISON:  Brain MRI dated 11/14/2016 FINDINGS: CT HEAD FINDINGS Brain: There is a small left frontal subarachnoid hemorrhage (series 13 image 27, sagittal series 6, image 50, and coronal series 5, image 42). No other acute intracranial hemorrhage identified. No mass effect or midline shift. The ventricles and sulci appropriate size for patient's age. Mild periventricular and deep white matter chronic microvascular ischemic changes noted. The previously seen left posterior thalamic metastatic lesion is not well visualized on this noncontrast CT. No mass effect or midline shift noted. No intra-axial fluid collection. Vascular: No hyperdense vessel or unexpected calcification. Skull: Normal. Negative for fracture or focal lesion. Sinuses/Orbits: Mild mucoperiosteal thickening of paranasal sinuses. No air-fluid levels. The mastoid air cells are clear. Bilateral cataract surgeries noted. Mild right periorbital contusion. Other: None CT CERVICAL SPINE FINDINGS Evaluation of this exam is limited due to motion artifact. Alignment: Normal. Skull base and vertebrae: No acute fracture. Slight irregular appearance of the tip of the odontoid process on the coronal images appears artifactual. No primary bone lesion or focal pathologic process. Soft tissues and spinal canal: No prevertebral fluid or swelling. No visible canal hematoma. Disc levels:  Degenerative changes primarily at C5-C6 and C6-C7. Upper chest: The visualized lung apices are clear. Other: Bilateral carotid bulb atherosclerotic plaques. IMPRESSION: Small left frontal subarachnoid hemorrhage.  Follow-up recommended. Mild age-related atrophy and chronic microvascular ischemic changes. No acute/traumatic cervical spine pathology. These results were called by telephone at the time of interpretation on 12/18/2016 at 10:55 pm to Dr. Brantley Stage , who  verbally acknowledged these results. Electronically Signed   By: Anner Crete M.D.   On: 12/18/2016 22:59   Ct Cervical Spine Wo Contrast  Result Date: 12/18/2016 CLINICAL DATA:  83 year old male with fall. History of metastatic lung cancer. EXAM: CT HEAD WITHOUT CONTRAST CT CERVICAL SPINE WITHOUT CONTRAST TECHNIQUE: Multidetector CT imaging of the head and cervical spine was performed following the standard protocol without intravenous contrast. Multiplanar CT image reconstructions of the cervical spine were also generated. COMPARISON:  Brain MRI dated 11/14/2016 FINDINGS: CT HEAD FINDINGS Brain: There is a small left frontal subarachnoid hemorrhage (series 13 image 27, sagittal series 6, image 50, and coronal series 5, image 42). No other acute intracranial hemorrhage identified. No mass effect or midline shift. The ventricles and sulci  appropriate size for patient's age. Mild periventricular and deep white matter chronic microvascular ischemic changes noted. The previously seen left posterior thalamic metastatic lesion is not well visualized on this noncontrast CT. No mass effect or midline shift noted. No intra-axial fluid collection. Vascular: No hyperdense vessel or unexpected calcification. Skull: Normal. Negative for fracture or focal lesion. Sinuses/Orbits: Mild mucoperiosteal thickening of paranasal sinuses. No air-fluid levels. The mastoid air cells are clear. Bilateral cataract surgeries noted. Mild right periorbital contusion. Other: None CT CERVICAL SPINE FINDINGS Evaluation of this exam is limited due to motion artifact. Alignment: Normal. Skull base and vertebrae: No acute fracture. Slight irregular appearance of the tip of the odontoid process on the coronal images appears artifactual. No primary bone lesion or focal pathologic process. Soft tissues and spinal canal: No prevertebral fluid or swelling. No visible canal hematoma. Disc levels:  Degenerative changes primarily at C5-C6 and C6-C7.  Upper chest: The visualized lung apices are clear. Other: Bilateral carotid bulb atherosclerotic plaques. IMPRESSION: Small left frontal subarachnoid hemorrhage.  Follow-up recommended. Mild age-related atrophy and chronic microvascular ischemic changes. No acute/traumatic cervical spine pathology. These results were called by telephone at the time of interpretation on 12/18/2016 at 10:55 pm to Dr. Brantley Stage , who verbally acknowledged these results. Electronically Signed   By: Anner Crete M.D.   On: 12/18/2016 22:59   Dg Chest Port 1 View  Result Date: 12/20/2016 CLINICAL DATA:  Cough, weakness. EXAM: PORTABLE CHEST 1 VIEW COMPARISON:  Radiographs of December 18, 2016. FINDINGS: Stable cardiomediastinal silhouette. Atherosclerosis of thoracic aorta is noted. No pneumothorax is noted. Left lung is clear. Mild right basilar subsegmental atelectasis or infiltrate is noted with minimal associated pleural effusion. Stable presence of brachytherapy seeds are again noted in right upper lobe. IMPRESSION: Aortic atherosclerosis. Mild right basilar subsegmental atelectasis or infiltrate with minimal associated pleural effusion. Electronically Signed   By: Marijo Conception, M.D.   On: 12/20/2016 09:09    Impression/Plan: 1.  Stage IV, T3, N2,M1a NSCL poorly differentiated adenocarcinoma of the right upper lobe with metastatic disease to the brain. The patient appears to be doing well since completing his radiotherapy despite his current scenario with his subarachnoid hemorrhage.  We discussed the rationale for following him closely with repeat imaging every 3 months, and we will schedule this for March. Our brain navigator will be in touch with him to coordinate this in his follow-up appointment to review the results. He understands to contact our office if he develops any neurologic symptoms or concerns prior to his next visit. 2. Subarachnoid hemorrhage. The patient appears to be clinically recovering doing well,  he continues to work with physical therapy and occupational therapy. We will plan to continue to follow this expectantly and he is anticipating going home in the near future. Is about we will follow up is scheduled fashion outpatient for additional imaging to follow the CNS.   Carola Rhine, PAC

## 2016-12-21 NOTE — Care Management Note (Signed)
Case Management Note  Patient Details  Name: [name] MRN: [id number] Date of Birth: 1927-03-28  Subjective/Objective:   PT-recc HH vs SNF. Spoke to patient about d/c plans-patient willing to explore SNF, but hoping to d/c home w/HHC-provided w/HHC agency list-await choice. Also provided w/private sitter list as resource(independent decision, out of pocket cost). CSW notified of faxing out to snf per patient request.                 Action/Plan:d/c plan home vs SNF.   Expected Discharge Date:   (unknown)               Expected Discharge Plan:  [place]  In-House Referral:  Clinical Social Work  Discharge planning Services  CM Consult  Post Acute Care Choice:    Choice offered to:  Patient  DME Arranged:    DME Agency:     HH Arranged:    [place] Agency:     Status of Service:  In process, will continue to follow  If discussed at Long Length of Stay Meetings, dates discussed:    Additional Comments:  Dessa Phi, RN 12/21/2016, 2:10 PM

## 2016-12-22 ENCOUNTER — Other Ambulatory Visit: Payer: Self-pay | Admitting: Radiation Therapy

## 2016-12-22 DIAGNOSIS — C7949 Secondary malignant neoplasm of other parts of nervous system: Principal | ICD-10-CM

## 2016-12-22 DIAGNOSIS — C7931 Secondary malignant neoplasm of brain: Secondary | ICD-10-CM

## 2016-12-22 DIAGNOSIS — I5022 Chronic systolic (congestive) heart failure: Secondary | ICD-10-CM

## 2016-12-22 DIAGNOSIS — E119 Type 2 diabetes mellitus without complications: Secondary | ICD-10-CM

## 2016-12-22 DIAGNOSIS — J111 Influenza due to unidentified influenza virus with other respiratory manifestations: Secondary | ICD-10-CM

## 2016-12-22 DIAGNOSIS — E860 Dehydration: Secondary | ICD-10-CM

## 2016-12-22 DIAGNOSIS — W19XXXD Unspecified fall, subsequent encounter: Secondary | ICD-10-CM

## 2016-12-22 LAB — CBC
HEMATOCRIT: 31.1 % — AB (ref 39.0–52.0)
Hemoglobin: 10.3 g/dL — ABNORMAL LOW (ref 13.0–17.0)
MCH: 25.1 pg — ABNORMAL LOW (ref 26.0–34.0)
MCHC: 33.1 g/dL (ref 30.0–36.0)
MCV: 75.7 fL — ABNORMAL LOW (ref 78.0–100.0)
PLATELETS: 209 10*3/uL (ref 150–400)
RBC: 4.11 MIL/uL — ABNORMAL LOW (ref 4.22–5.81)
RDW: 18.6 % — ABNORMAL HIGH (ref 11.5–15.5)
WBC: 1.9 10*3/uL — AB (ref 4.0–10.5)

## 2016-12-22 LAB — BASIC METABOLIC PANEL
Anion gap: 7 (ref 5–15)
BUN: 11 mg/dL (ref 6–20)
CALCIUM: 8.5 mg/dL — AB (ref 8.9–10.3)
CO2: 27 mmol/L (ref 22–32)
CREATININE: 0.52 mg/dL — AB (ref 0.61–1.24)
Chloride: 102 mmol/L (ref 101–111)
Glucose, Bld: 143 mg/dL — ABNORMAL HIGH (ref 65–99)
Potassium: 3.5 mmol/L (ref 3.5–5.1)
SODIUM: 136 mmol/L (ref 135–145)

## 2016-12-22 LAB — GLUCOSE, CAPILLARY
GLUCOSE-CAPILLARY: 140 mg/dL — AB (ref 65–99)
GLUCOSE-CAPILLARY: 140 mg/dL — AB (ref 65–99)
GLUCOSE-CAPILLARY: 210 mg/dL — AB (ref 65–99)

## 2016-12-22 MED ORDER — OSELTAMIVIR PHOSPHATE 75 MG PO CAPS: 75.0000 mg | ORAL_CAPSULE | Freq: Two times a day (BID) | ORAL | 0 refills | Status: DC

## 2016-12-22 MED ORDER — ATORVASTATIN CALCIUM 20 MG PO TABS: 20.0000 mg | ORAL_TABLET | Freq: Every day | ORAL | 0 refills | Status: AC

## 2016-12-22 MED ORDER — FUROSEMIDE 40 MG PO TABS: 40.0000 mg | ORAL_TABLET | Freq: Every day | ORAL | 3 refills | Status: DC | PRN

## 2016-12-22 MED ORDER — GUAIFENESIN-DM 100-10 MG/5ML PO SYRP: 5.0000 mL | ORAL_SOLUTION | ORAL | 0 refills | Status: AC | PRN

## 2016-12-22 NOTE — Discharge Summary (Signed)
Physician Discharge Summary  [name] GMW:[id number] DOB: [birth year] DOA: 12/18/2016  PCP: Purvis Kilts, MD  Admit date: 12/18/2016 Discharge date: 12/22/2016  Admitted From: home  Disposition: home   Recommendations for Outpatient Follow-up:  1. Holding Eliquis for at least 1 month  Home Health: ordered Equipment/Devices:      Discharge Condition:  stable   CODE STATUS:  DNR   Diet recommendation:  Diabetic heart healthy diet Consultations:      Discharge Diagnoses:  Principal Problem:   SAH (subarachnoid hemorrhage) ([place]) Active Problems:   Fall   Sepsis ([place])   Influenza with respiratory manifestation   Atrial fibrillation with rapid ventricular response ([place])   DM type 2 (diabetes mellitus, type 2) ([place])   Adenocarcinoma of right lung, stage 4 ([place])   Palliative care by specialist   Dehydration   Chronic systolic CHF (congestive heart failure) (H[facility])    Subjective: Mild cough. Over all states he is feeling well.   Brief Summary: Andrew Goetzke Haskinsis a 83 y.o.malewith medical history significant of stage IV adenocarcinoma of the right lung s/p radiation(followed by Dr. Tammi Klippel) and chemotherapy (followed by Dr. Earlie Server), systolic CHF last YQ03-47% in 07/2016; who presents after having a fall at home. History is elicited from the help of friends present at bedside who help look after the patient. He has no recollection of the events that happened. The fall occurred sometime between 2:30 PM and 5:30 PM when his daughter called and could not get him to answer. Patient was found on the floor of his home. He just completed 10 rounds of radiation at the end of last year and had his first round of chemotherapy approximately 3 weeks ago. Following chemotherapy patient has had progressive decline with generalized weakness. The reported associated symptoms of decreased appetite, poor fluid intake, lower extremity swelling, intermittent confusion, and productive cough.  They report his cough is chronic and unchanged from baseline. At baseline patient does not use a cane or walker to ambulate and had continued to be able to drive. Patient denies any chest pain, hemoptysis, or shortness of breath at this time.  Hospital Course:  Fall with L frontal subarachnoid hemorrhage: Patient apparently fell and was down for some unknown period in time. Initial CT of brain showed a small left frontal subarachnoid hemorrhage. Neurosurgery was consulted by the ED physician and recommended holding Eliquisand to monitor. - repeated CT Head 1/22 small SAH much less apparent and no acute findings -Dr Wynetta Emery discussion with cardiologist Dr. Oval Linsey about eliquis and he likely needs to be off of it for at least a month or longer pending neurosurgery recommendations. He is at higher risk of bleed from the [facility] versus his risk of stroke from Afib. A follow up in 1 month will be arranged for him with his personal cardiologist Dr. Domenic Polite, she says.  Influenza +  - Pt on 5 days of tamiflu, droplet precautions, supportive care  Severe Sepsis- due to above  - tachycardic, tachypneic, with leukopenia, and elevated lactic acid of 3.46. - blood/urine cultures negative - lactic acid normalized now - Initially placed on vancomycin and Zosyn but now discontinued  Hypokalemia  - repleted  Dehydration- fall due to orthostatic hypotension Systolic heart failure last EFnoted to be 45-50%. - have been holding furosemide - Continue to monitor to avoid fluid overload   Metastatic adenocarcinoma of the lung stage IV - per Dr. Earlie Server And Dr. Tammi Klippel  Atrial fibrillation on Elqiuis - Eliquis Held due to  SAH - Continue metoprolol, as tolerated  Essential hypertension - Continue Cozaar and medications as seen above   Diabetes mellitus type 2 - Held glipizide and Janumet - SSI     Discharge Instructions  Discharge Instructions    Call MD for:  difficulty breathing,  headache or visual disturbances    Complete by:  As directed    Call MD for:  persistant dizziness or light-headedness    Complete by:  As directed    Call MD for:  temperature >100.4    Complete by:  As directed    Diet - low sodium heart healthy    Complete by:  As directed    Diet Carb Modified    Complete by:  As directed    Increase activity slowly    Complete by:  As directed      Allergies as of 12/22/2016      Reactions   Sulfa Antibiotics Rash      Medication List    STOP taking these medications   apixaban 5 MG Tabs tablet Commonly known as:  ELIQUIS   simvastatin 40 MG tablet Commonly known as:  ZOCOR     TAKE these medications   albuterol 108 (90 Base) MCG/ACT inhaler Commonly known as:  PROVENTIL HFA;VENTOLIN HFA Inhale 2 puffs into the lungs every 6 (six) hours as needed for wheezing or shortness of breath.   atorvastatin 20 MG tablet Commonly known as:  LIPITOR Take 1 tablet (20 mg total) by mouth at bedtime.   dexamethasone 4 MG tablet Commonly known as:  DECADRON 4 mg by mouth twice a day the day before, day of and day after the chemotherapy every 3 weeks   Fish Oil 1000 MG Caps Take by mouth.   folic acid 240 MCG tablet Commonly known as:  FOLVITE Take 800 mcg by mouth daily.   folic acid 1 MG tablet Commonly known as:  FOLVITE Take 1 tablet (1 mg total) by mouth daily.   furosemide 40 MG tablet Commonly known as:  LASIX Take 1 tablet (40 mg total) by mouth daily.   glipiZIDE 5 MG tablet Commonly known as:  GLUCOTROL Take 5 mg by mouth 2 (two) times daily before a meal.   guaiFENesin-dextromethorphan 100-10 MG/5ML syrup Commonly known as:  ROBITUSSIN DM Take 5 mLs by mouth every 4 (four) hours as needed for cough.   JANUMET 50-1000 MG tablet Generic drug:  sitaGLIPtin-metformin 1 tablet 2 (two) times daily.   LORazepam 1 MG tablet Commonly known as:  ATIVAN Take 0.5 tablets (0.5 mg total) by mouth every 8 (eight) hours as needed  for anxiety (30 min before MRI).   losartan 100 MG tablet Commonly known as:  COZAAR Take 100 mg by mouth daily.   metoprolol 50 MG tablet Commonly known as:  LOPRESSOR Take 1 tablet (50 mg total) by mouth 2 (two) times daily.   oseltamivir 75 MG capsule Commonly known as:  TAMIFLU Take 1 capsule (75 mg total) by mouth 2 (two) times daily.   oxybutynin 10 MG 24 hr tablet Commonly known as:  DITROPAN-XL Take 10 mg by mouth at bedtime.   potassium chloride 10 MEQ tablet Commonly known as:  K-DUR Take 1 tablet (10 mEq total) by mouth daily.   prochlorperazine 10 MG tablet Commonly known as:  COMPAZINE Take 1 tablet (10 mg total) by mouth every 6 (six) hours as needed for nausea or vomiting.   RADIAPLEX EX Apply topically.   UROXATRAL 10 MG  24 hr tablet Generic drug:  alfuzosin Take 10 mg by mouth daily.      Follow-up Information    Rozann Lesches, MD Follow up.   Specialty:  Cardiology Why:  Cardiology Follow-Up on 01/03/2017 at 9:00AM. Contact information: [place] [address] [phone]          Allergies  Allergen Reactions  . Sulfa Antibiotics Rash     Procedures/Studies:   Dg Chest 2 View  Result Date: 12/18/2016 CLINICAL DATA:  Cough. Fall today. Undergoing chemotherapy for lung cancer. EXAM: CHEST  2 VIEW COMPARISON:  Chest radiographs and CT 09/30/2016 FINDINGS: Cardiac silhouette remains mildly enlarged. Aortic atherosclerosis is noted. Mediastinal contours are unchanged. Brachia therapy seeds are again seen associated with an anterior right upper lobe mass. The mass itself is less well seen compared to the prior radiographs, potentially indicating a reduction in size. There is a persistent small right pleural effusion with similar appearance of patchy basilar right lung opacity. There may be a trace left pleural effusion. The left lung is grossly clear. No acute osseous abnormality is seen. IMPRESSION: 1. Anterior right upper lobe  mass, potentially decreased in size though not well of elevated by radiographs. 2. Persistent small right pleural effusion. 3. Unchanged right basilar opacity which may reflect atelectasis. 4. Aortic atherosclerosis. Electronically Signed   By: Logan Bores M.D.   On: 12/18/2016 21:31   Ct Head Wo Contrast  Result Date: 12/19/2016 CLINICAL DATA:  Acute onset of generalized weakness. Current history of metastatic lung adenocarcinoma. Initial encounter. EXAM: CT HEAD WITHOUT CONTRAST TECHNIQUE: Contiguous axial images were obtained from the base of the skull through the vertex without intravenous contrast. COMPARISON:  CT of the head performed 12/18/2016 FINDINGS: Brain: No evidence of acute infarction, hydrocephalus, extra-axial collection or mass lesion/mass effect. The previously noted small focus of acute subarachnoid hemorrhage at the left frontoparietal region is less apparent than on the prior study. Prominence of the ventricles and sulci reflects mild to moderate cortical volume loss. Mild cerebellar atrophy is noted. Scattered periventricular white matter change likely reflects small vessel ischemic microangiopathy. The brainstem and fourth ventricle are within normal limits. The basal ganglia are unremarkable in appearance. The cerebral hemispheres demonstrate grossly normal gray-white differentiation. No mass effect or midline shift is seen. Vascular: No hyperdense vessel or unexpected calcification. Skull: There is no evidence of fracture; visualized osseous structures are unremarkable in appearance. Sinuses/Orbits: The orbits are within normal limits. Mild mucosal thickening is noted at the maxillary sinuses bilaterally. The remaining paranasal sinuses and mastoid air cells are well-aerated. Other: No significant soft tissue abnormalities are seen. IMPRESSION: 1. No acute intracranial pathology seen on CT. 2. Previously noted small focus of acute subarachnoid hemorrhage at the left frontoparietal  region is less apparent than on the prior study. 3. Mild to moderate cortical volume loss and scattered small vessel ischemic microangiopathy. 4. Mild mucosal thickening at the maxillary sinuses bilaterally. Electronically Signed   By: Garald Balding M.D.   On: 12/19/2016 22:44   Ct Head Wo Contrast  Result Date: 12/18/2016 CLINICAL DATA:  83 year old male with fall. History of metastatic lung cancer. EXAM: CT HEAD WITHOUT CONTRAST CT CERVICAL SPINE WITHOUT CONTRAST TECHNIQUE: Multidetector CT imaging of the head and cervical spine was performed following the standard protocol without intravenous contrast. Multiplanar CT image reconstructions of the cervical spine were also generated. COMPARISON:  Brain MRI dated 11/14/2016 FINDINGS: CT HEAD FINDINGS Brain: There is a small left frontal subarachnoid hemorrhage (  series 13 image 27, sagittal series 6, image 50, and coronal series 5, image 42). No other acute intracranial hemorrhage identified. No mass effect or midline shift. The ventricles and sulci appropriate size for patient's age. Mild periventricular and deep white matter chronic microvascular ischemic changes noted. The previously seen left posterior thalamic metastatic lesion is not well visualized on this noncontrast CT. No mass effect or midline shift noted. No intra-axial fluid collection. Vascular: No hyperdense vessel or unexpected calcification. Skull: Normal. Negative for fracture or focal lesion. Sinuses/Orbits: Mild mucoperiosteal thickening of paranasal sinuses. No air-fluid levels. The mastoid air cells are clear. Bilateral cataract surgeries noted. Mild right periorbital contusion. Other: None CT CERVICAL SPINE FINDINGS Evaluation of this exam is limited due to motion artifact. Alignment: Normal. Skull base and vertebrae: No acute fracture. Slight irregular appearance of the tip of the odontoid process on the coronal images appears artifactual. No primary bone lesion or focal pathologic  process. Soft tissues and spinal canal: No prevertebral fluid or swelling. No visible canal hematoma. Disc levels:  Degenerative changes primarily at C5-C6 and C6-C7. Upper chest: The visualized lung apices are clear. Other: Bilateral carotid bulb atherosclerotic plaques. IMPRESSION: Small left frontal subarachnoid hemorrhage.  Follow-up recommended. Mild age-related atrophy and chronic microvascular ischemic changes. No acute/traumatic cervical spine pathology. These results were called by telephone at the time of interpretation on 12/18/2016 at 10:55 pm to Dr. Brantley Stage , who verbally acknowledged these results. Electronically Signed   By: Anner Crete M.D.   On: 12/18/2016 22:59   Ct Cervical Spine Wo Contrast  Result Date: 12/18/2016 CLINICAL DATA:  83 year old male with fall. History of metastatic lung cancer. EXAM: CT HEAD WITHOUT CONTRAST CT CERVICAL SPINE WITHOUT CONTRAST TECHNIQUE: Multidetector CT imaging of the head and cervical spine was performed following the standard protocol without intravenous contrast. Multiplanar CT image reconstructions of the cervical spine were also generated. COMPARISON:  Brain MRI dated 11/14/2016 FINDINGS: CT HEAD FINDINGS Brain: There is a small left frontal subarachnoid hemorrhage (series 13 image 27, sagittal series 6, image 50, and coronal series 5, image 42). No other acute intracranial hemorrhage identified. No mass effect or midline shift. The ventricles and sulci appropriate size for patient's age. Mild periventricular and deep white matter chronic microvascular ischemic changes noted. The previously seen left posterior thalamic metastatic lesion is not well visualized on this noncontrast CT. No mass effect or midline shift noted. No intra-axial fluid collection. Vascular: No hyperdense vessel or unexpected calcification. Skull: Normal. Negative for fracture or focal lesion. Sinuses/Orbits: Mild mucoperiosteal thickening of paranasal sinuses. No air-fluid  levels. The mastoid air cells are clear. Bilateral cataract surgeries noted. Mild right periorbital contusion. Other: None CT CERVICAL SPINE FINDINGS Evaluation of this exam is limited due to motion artifact. Alignment: Normal. Skull base and vertebrae: No acute fracture. Slight irregular appearance of the tip of the odontoid process on the coronal images appears artifactual. No primary bone lesion or focal pathologic process. Soft tissues and spinal canal: No prevertebral fluid or swelling. No visible canal hematoma. Disc levels:  Degenerative changes primarily at C5-C6 and C6-C7. Upper chest: The visualized lung apices are clear. Other: Bilateral carotid bulb atherosclerotic plaques. IMPRESSION: Small left frontal subarachnoid hemorrhage.  Follow-up recommended. Mild age-related atrophy and chronic microvascular ischemic changes. No acute/traumatic cervical spine pathology. These results were called by telephone at the time of interpretation on 12/18/2016 at 10:55 pm to Dr. Brantley Stage , who verbally acknowledged these results. Electronically Signed   By: Milas Hock  Radparvar M.D.   On: 12/18/2016 22:59   Dg Chest Port 1 View  Result Date: 12/20/2016 CLINICAL DATA:  Cough, weakness. EXAM: PORTABLE CHEST 1 VIEW COMPARISON:  Radiographs of December 18, 2016. FINDINGS: Stable cardiomediastinal silhouette. Atherosclerosis of thoracic aorta is noted. No pneumothorax is noted. Left lung is clear. Mild right basilar subsegmental atelectasis or infiltrate is noted with minimal associated pleural effusion. Stable presence of brachytherapy seeds are again noted in right upper lobe. IMPRESSION: Aortic atherosclerosis. Mild right basilar subsegmental atelectasis or infiltrate with minimal associated pleural effusion. Electronically Signed   By: Marijo Conception, M.D.   On: 12/20/2016 09:09       Discharge Exam: Vitals:   12/22/16 0300 12/22/16 1227  BP: 107/75 129/73  Pulse: 85 87  Resp: 18 16  Temp: 97.8 F (36.6 C)  97.5 F (36.4 C)   Vitals:   12/21/16 1437 12/21/16 2047 12/22/16 0300 12/22/16 1227  BP: 118/68 137/82 107/75 129/73  Pulse: 98 100 85 87  Resp: '20 19 18 16  '$ Temp: 97.6 F (36.4 C) 97.9 F (36.6 C) 97.8 F (36.6 C) 97.5 F (36.4 C)  TempSrc: Oral Oral Oral Oral  SpO2: 96% 97% 98% 98%  Weight:   96.4 kg (212 lb 9.6 oz)   Height:        General: Pt is alert, awake, not in acute distress Cardiovascular: RRR, S1/S2 +, no rubs, no gallops Respiratory: CTA bilaterally, no wheezing, no rhonchi Abdominal: Soft, NT, ND, bowel sounds + Extremities: no edema, no cyanosis    The results of significant diagnostics from this hospitalization (including imaging, microbiology, ancillary and laboratory) are listed below for reference.     Microbiology: Recent Results (from the past 240 hour(s))  Blood culture (routine x 2)     Status: None (Preliminary result)   Collection Time: 12/18/16 10:13 PM  Result Value Ref Range Status   Specimen Description BLOOD LEFT WRIST  Final   Special Requests IN PEDIATRIC BOTTLE 2CC  Final   Culture   Final    NO GROWTH 2 DAYS Performed at [facility], [address] [place], [place] [address]    Report Status PENDING  Incomplete  Blood culture (routine x 2)     Status: None (Preliminary result)   Collection Time: 12/18/16 10:17 PM  Result Value Ref Range Status   Specimen Description BLOOD RIGHT ANTECUBITAL  Final   Special Requests BOTTLES DRAWN AEROBIC AND ANAEROBIC 5CC  Final   Culture   Final    NO GROWTH 2 DAYS Performed at [facility], [place] [address] [place], [place] [address]    Report Status PENDING  Incomplete  Urine culture     Status: None   Collection Time: 12/18/16 11:17 PM  Result Value Ref Range Status   Specimen Description URINE, CLEAN CATCH  Final   Special Requests NONE  Final   Culture   Final    NO GROWTH Performed at [facility], [place] [address] [place], [place] [address]    Report Status  12/20/2016 FINAL  Final  MRSA PCR Screening     Status: None   Collection Time: 12/19/16  3:09 AM  Result Value Ref Range Status   MRSA by PCR NEGATIVE NEGATIVE Final    Comment:        The GeneXpert MRSA Assay (FDA approved for NASAL specimens only), is one component of a comprehensive MRSA colonization surveillance program. It is not intended to  diagnose MRSA infection nor to guide or monitor treatment for MRSA infections.      Labs: BNP (last 3 results)  Recent Labs  09/30/16 1136  BNP 96.7   Basic Metabolic Panel:  Recent Labs Lab 12/18/16 2155 12/19/16 0553 12/20/16 0337 12/20/16 2126 12/21/16 0455 12/22/16 0551  NA  --  135 133* 131* 134* 136  K  --  3.3* 4.0 3.3* 3.9 3.5  CL  --  104 103 101 102 102  CO2  --  '23 24 22 26 27  '$ GLUCOSE  --  82 83 261* 136* 143*  BUN  --  '19 13 13 10 11  '$ CREATININE  --  0.55* 0.52* 0.59* 0.53* 0.52*  CALCIUM  --  8.4* 8.4* 8.1* 8.5* 8.5*  MG 1.6*  --   --  1.6* 2.1  --    Liver Function Tests:  Recent Labs Lab 12/18/16 2107  AST 52*  ALT 30  ALKPHOS 63  BILITOT 0.6  PROT 6.6  ALBUMIN 3.4*   No results for input(s): LIPASE, AMYLASE in the last 168 hours. No results for input(s): AMMONIA in the last 168 hours. CBC:  Recent Labs Lab 12/18/16 2107 12/19/16 0553 12/20/16 0337 12/21/16 0455 12/22/16 0551  WBC 1.6* 1.6* 1.8* 1.7* 1.9*  NEUTROABS 0.8*  --   --   --   --   HGB 11.1* 9.7* 10.2* 10.1* 10.3*  HCT 34.2* 29.6* 31.2* 30.5* 31.1*  MCV 75.5* 74.2* 74.5* 75.3* 75.7*  PLT 204 182 199 196 209   Cardiac Enzymes:  Recent Labs Lab 12/18/16 2155  CKTOTAL 207   BNP: Invalid input(s): POCBNP CBG:  Recent Labs Lab 12/21/16 0749 12/21/16 1131 12/21/16 1640 12/21/16 2045 12/22/16 0720  GLUCAP 139* 167* 200* 183* 140*   D-Dimer No results for input(s): DDIMER in the last 72 hours. Hgb A1c No results for input(s): HGBA1C in the last 72 hours. Lipid Profile No results for input(s): CHOL, HDL,  LDLCALC, TRIG, CHOLHDL, LDLDIRECT in the last 72 hours. Thyroid function studies No results for input(s): TSH, T4TOTAL, T3FREE, THYROIDAB in the last 72 hours.  Invalid input(s): FREET3 Anemia work up No results for input(s): VITAMINB12, FOLATE, FERRITIN, TIBC, IRON, RETICCTPCT in the last 72 hours. Urinalysis    Component Value Date/Time   COLORURINE YELLOW 12/18/2016 2317   APPEARANCEUR HAZY (A) 12/18/2016 2317   LABSPEC 1.016 12/18/2016 2317   PHURINE 5.0 12/18/2016 2317   GLUCOSEU NEGATIVE 12/18/2016 2317   HGBUR LARGE (A) 12/18/2016 2317   BILIRUBINUR NEGATIVE 12/18/2016 2317   KETONESUR NEGATIVE 12/18/2016 2317   PROTEINUR 30 (A) 12/18/2016 2317   NITRITE NEGATIVE 12/18/2016 2317   LEUKOCYTESUR TRACE (A) 12/18/2016 2317   Sepsis Labs Invalid input(s): PROCALCITONIN,  WBC,  LACTICIDVEN Microbiology Recent Results (from the past 240 hour(s))  Blood culture (routine x 2)     Status: None (Preliminary result)   Collection Time: 12/18/16 10:13 PM  Result Value Ref Range Status   Specimen Description BLOOD LEFT WRIST  Final   Special Requests IN PEDIATRIC BOTTLE 2CC  Final   Culture   Final    NO GROWTH 2 DAYS Performed at [facility], [place] [address] [place], [place] [address]    Report Status PENDING  Incomplete  Blood culture (routine x 2)     Status: None (Preliminary result)   Collection Time: 12/18/16 10:17 PM  Result Value Ref Range Status   Specimen Description BLOOD RIGHT ANTECUBITAL  Final   Special  Requests BOTTLES DRAWN AEROBIC AND ANAEROBIC 5CC  Final   Culture   Final    NO GROWTH 2 DAYS Performed at [facility], [place] [address], [place], [place] [address]    Report Status PENDING  Incomplete  Urine culture     Status: None   Collection Time: 12/18/16 11:17 PM  Result Value Ref Range Status   Specimen Description URINE, CLEAN CATCH  Final   Special Requests NONE  Final   Culture   Final    NO GROWTH Performed at [facility],  [place] [address], [place], [place] [address]    Report Status 12/20/2016 FINAL  Final  MRSA PCR Screening     Status: None   Collection Time: 12/19/16  3:09 AM  Result Value Ref Range Status   MRSA by PCR NEGATIVE NEGATIVE Final    Comment:        The GeneXpert MRSA Assay (FDA approved for NASAL specimens only), is one component of a comprehensive MRSA colonization surveillance program. It is not intended to diagnose MRSA infection nor to guide or monitor treatment for MRSA infections.      Time coordinating discharge: Over 30 minutes  SIGNED:   Debbe Odea, MD  [facility] 12/22/2016, 2:01 PM Pager   If 7PM-7AM, please contact night-coverage [email or web address] Password [email or web address]

## 2016-12-22 NOTE — Progress Notes (Signed)
Completed D/C teaching. Answered questions. Discussed medications. Patient will be D/C home in stable condition with family.

## 2016-12-22 NOTE — Care Management Note (Signed)
Case Management Note  Patient Details  Name: [name] MRN: [id number] Date of Birth: September 09, 1927  Subjective/Objective:     83 y.o. M that had been seen by CM earlier in the week and offered [facility] care list tells me he has made arrangements with wife's niece to come and assist him in his home and he will not need [facility] services.                Action/Plan:CM will sign off for now but will be available should additional discharge needs arise or disposition change.    Expected Discharge Date:  12/22/16               Expected Discharge Plan:  [place]  In-House Referral:  Clinical Social Work  Discharge planning Services  CM Consult  Post Acute Care Choice:  NA Choice offered to:  Patient  DME Arranged:  N/A DME Agency:     HH Arranged:  NA HH Agency:  NA  Status of Service:  Completed, signed off  If discussed at [place] of Stay Meetings, dates discussed:    Additional Comments:  Delrae Sawyers, RN 12/22/2016, 2:54 PM

## 2016-12-22 NOTE — Progress Notes (Signed)
Physical Therapy Treatment Patient Details Name: [name] MRN: [id number] DOB: 29-Mar-1927 Today's Date: 12/22/2016    History of Present Illness [name] is a 83 y.o. male with medical history significant of stage IV adenocarcinoma of the right lung s/p radiation and chemotherapy, systolic CHF last EF 81-01% in 07/2016; who presents after having a fall at home.   Initial CT of brain showed a small left frontal subarachnoid hemorrhage              PT Comments    Improved mobility and safety this session. Pt walked ~135 feet with a RW. He denied dizziness/lightheadedness. No LOB with use of RW. Pt stated he plans to d/c home with a niece who will stay for a few days. He reported he has all DME needed. He is set to d/c later today.    Follow Up Recommendations  Home health PT;Supervision/Assistance - 24 hour (If pt is agreeable to PT follow up)     Equipment Recommendations  None recommended by PT    Recommendations for Other Services       Precautions / Restrictions Precautions Precautions: Fall Restrictions Weight Bearing Restrictions: No    Mobility  Bed Mobility Overal bed mobility: Modified Independent                Transfers Overall transfer level: Needs assistance Equipment used: Rolling walker (2 wheeled) Transfers: Sit to/from Stand Sit to Stand: Supervision         General transfer comment: for safety. VCs hand placement.   Ambulation/Gait Ambulation/Gait assistance: Supervision Ambulation Distance (Feet): 135 Feet Assistive device: Rolling walker (2 wheeled) Gait Pattern/deviations: Step-through pattern;Decreased stride length     General Gait Details: for safety. Pt tolerated distance well. He denied lightheadedness/dizziness. No LOB with RW use.    Stairs            Wheelchair Mobility    Modified Rankin (Stroke Patients Only)       Balance                                    Cognition  Arousal/Alertness: Awake/alert Behavior During Therapy: WFL for tasks assessed/performed Overall Cognitive Status: Within Functional Limits for tasks assessed                      Exercises      General Comments        Pertinent Vitals/Pain Pain Assessment: No/denies pain    Home Living                      Prior Function            PT Goals (current goals can now be found in the care plan section) Progress towards PT goals: Progressing toward goals    Frequency    Min 3X/week      PT Plan Current plan remains appropriate    Co-evaluation             End of Session Equipment Utilized During Treatment: Gait belt Activity Tolerance: Patient tolerated treatment well Patient left: in bed;with call bell/phone within reach     Time: 1429-1439 PT Time Calculation (min) (ACUTE ONLY): 10 min  Charges:  $Gait Training: 8-22 mins                    G Codes:  Weston Anna, MPT Pager: [phone]

## 2016-12-24 LAB — CULTURE, BLOOD (ROUTINE X 2)
CULTURE: NO GROWTH
Culture: NO GROWTH

## 2016-12-25 DIAGNOSIS — C3491 Malignant neoplasm of unspecified part of right bronchus or lung: Secondary | ICD-10-CM | POA: Diagnosis not present

## 2016-12-25 DIAGNOSIS — W19XXXD Unspecified fall, subsequent encounter: Secondary | ICD-10-CM | POA: Diagnosis not present

## 2016-12-25 DIAGNOSIS — I5022 Chronic systolic (congestive) heart failure: Secondary | ICD-10-CM | POA: Diagnosis not present

## 2016-12-25 DIAGNOSIS — E119 Type 2 diabetes mellitus without complications: Secondary | ICD-10-CM | POA: Diagnosis not present

## 2016-12-25 DIAGNOSIS — L89321 Pressure ulcer of left buttock, stage 1: Secondary | ICD-10-CM | POA: Diagnosis not present

## 2016-12-25 DIAGNOSIS — Z87891 Personal history of nicotine dependence: Secondary | ICD-10-CM | POA: Diagnosis not present

## 2016-12-25 DIAGNOSIS — Z7984 Long term (current) use of oral hypoglycemic drugs: Secondary | ICD-10-CM | POA: Diagnosis not present

## 2016-12-25 DIAGNOSIS — L89312 Pressure ulcer of right buttock, stage 2: Secondary | ICD-10-CM | POA: Diagnosis not present

## 2016-12-25 DIAGNOSIS — Z742 Need for assistance at home and no other household member able to render care: Secondary | ICD-10-CM | POA: Diagnosis not present

## 2016-12-25 DIAGNOSIS — J111 Influenza due to unidentified influenza virus with other respiratory manifestations: Secondary | ICD-10-CM | POA: Diagnosis not present

## 2016-12-25 DIAGNOSIS — I4891 Unspecified atrial fibrillation: Secondary | ICD-10-CM | POA: Diagnosis not present

## 2016-12-25 DIAGNOSIS — C7931 Secondary malignant neoplasm of brain: Secondary | ICD-10-CM | POA: Diagnosis not present

## 2016-12-25 DIAGNOSIS — S066X9D Traumatic subarachnoid hemorrhage with loss of consciousness of unspecified duration, subsequent encounter: Secondary | ICD-10-CM | POA: Diagnosis not present

## 2016-12-25 DIAGNOSIS — I110 Hypertensive heart disease with heart failure: Secondary | ICD-10-CM | POA: Diagnosis not present

## 2016-12-26 DIAGNOSIS — L89321 Pressure ulcer of left buttock, stage 1: Secondary | ICD-10-CM | POA: Diagnosis not present

## 2016-12-26 DIAGNOSIS — S066X9D Traumatic subarachnoid hemorrhage with loss of consciousness of unspecified duration, subsequent encounter: Secondary | ICD-10-CM | POA: Diagnosis not present

## 2016-12-26 DIAGNOSIS — C3491 Malignant neoplasm of unspecified part of right bronchus or lung: Secondary | ICD-10-CM | POA: Diagnosis not present

## 2016-12-26 DIAGNOSIS — C7931 Secondary malignant neoplasm of brain: Secondary | ICD-10-CM | POA: Diagnosis not present

## 2016-12-26 DIAGNOSIS — L89312 Pressure ulcer of right buttock, stage 2: Secondary | ICD-10-CM | POA: Diagnosis not present

## 2016-12-26 DIAGNOSIS — E119 Type 2 diabetes mellitus without complications: Secondary | ICD-10-CM | POA: Diagnosis not present

## 2016-12-27 DIAGNOSIS — C7931 Secondary malignant neoplasm of brain: Secondary | ICD-10-CM | POA: Diagnosis not present

## 2016-12-27 DIAGNOSIS — E119 Type 2 diabetes mellitus without complications: Secondary | ICD-10-CM | POA: Diagnosis not present

## 2016-12-27 DIAGNOSIS — C3491 Malignant neoplasm of unspecified part of right bronchus or lung: Secondary | ICD-10-CM | POA: Diagnosis not present

## 2016-12-27 DIAGNOSIS — L89321 Pressure ulcer of left buttock, stage 1: Secondary | ICD-10-CM | POA: Diagnosis not present

## 2016-12-27 DIAGNOSIS — S066X9D Traumatic subarachnoid hemorrhage with loss of consciousness of unspecified duration, subsequent encounter: Secondary | ICD-10-CM | POA: Diagnosis not present

## 2016-12-27 DIAGNOSIS — L89312 Pressure ulcer of right buttock, stage 2: Secondary | ICD-10-CM | POA: Diagnosis not present

## 2016-12-28 ENCOUNTER — Other Ambulatory Visit: Payer: Medicare Other

## 2016-12-28 ENCOUNTER — Ambulatory Visit: Payer: Self-pay | Admitting: Radiation Oncology

## 2016-12-28 DIAGNOSIS — L89312 Pressure ulcer of right buttock, stage 2: Secondary | ICD-10-CM | POA: Diagnosis not present

## 2016-12-28 DIAGNOSIS — L89321 Pressure ulcer of left buttock, stage 1: Secondary | ICD-10-CM | POA: Diagnosis not present

## 2016-12-28 DIAGNOSIS — C3491 Malignant neoplasm of unspecified part of right bronchus or lung: Secondary | ICD-10-CM | POA: Diagnosis not present

## 2016-12-28 DIAGNOSIS — C7931 Secondary malignant neoplasm of brain: Secondary | ICD-10-CM | POA: Diagnosis not present

## 2016-12-28 DIAGNOSIS — E119 Type 2 diabetes mellitus without complications: Secondary | ICD-10-CM | POA: Diagnosis not present

## 2016-12-28 DIAGNOSIS — S066X9D Traumatic subarachnoid hemorrhage with loss of consciousness of unspecified duration, subsequent encounter: Secondary | ICD-10-CM | POA: Diagnosis not present

## 2016-12-29 DIAGNOSIS — C3491 Malignant neoplasm of unspecified part of right bronchus or lung: Secondary | ICD-10-CM | POA: Diagnosis not present

## 2016-12-29 DIAGNOSIS — E119 Type 2 diabetes mellitus without complications: Secondary | ICD-10-CM | POA: Diagnosis not present

## 2016-12-29 DIAGNOSIS — S066X9D Traumatic subarachnoid hemorrhage with loss of consciousness of unspecified duration, subsequent encounter: Secondary | ICD-10-CM | POA: Diagnosis not present

## 2016-12-29 DIAGNOSIS — L89312 Pressure ulcer of right buttock, stage 2: Secondary | ICD-10-CM | POA: Diagnosis not present

## 2016-12-29 DIAGNOSIS — C7931 Secondary malignant neoplasm of brain: Secondary | ICD-10-CM | POA: Diagnosis not present

## 2016-12-29 DIAGNOSIS — L89321 Pressure ulcer of left buttock, stage 1: Secondary | ICD-10-CM | POA: Diagnosis not present

## 2016-12-30 DIAGNOSIS — L89312 Pressure ulcer of right buttock, stage 2: Secondary | ICD-10-CM | POA: Diagnosis not present

## 2016-12-30 DIAGNOSIS — L89321 Pressure ulcer of left buttock, stage 1: Secondary | ICD-10-CM | POA: Diagnosis not present

## 2016-12-30 DIAGNOSIS — E119 Type 2 diabetes mellitus without complications: Secondary | ICD-10-CM | POA: Diagnosis not present

## 2016-12-30 DIAGNOSIS — S066X9D Traumatic subarachnoid hemorrhage with loss of consciousness of unspecified duration, subsequent encounter: Secondary | ICD-10-CM | POA: Diagnosis not present

## 2016-12-30 DIAGNOSIS — C3491 Malignant neoplasm of unspecified part of right bronchus or lung: Secondary | ICD-10-CM | POA: Diagnosis not present

## 2016-12-30 DIAGNOSIS — C7931 Secondary malignant neoplasm of brain: Secondary | ICD-10-CM | POA: Diagnosis not present

## 2017-01-02 DIAGNOSIS — E119 Type 2 diabetes mellitus without complications: Secondary | ICD-10-CM | POA: Diagnosis not present

## 2017-01-02 DIAGNOSIS — C7931 Secondary malignant neoplasm of brain: Secondary | ICD-10-CM | POA: Diagnosis not present

## 2017-01-02 DIAGNOSIS — C3491 Malignant neoplasm of unspecified part of right bronchus or lung: Secondary | ICD-10-CM | POA: Diagnosis not present

## 2017-01-02 DIAGNOSIS — L89312 Pressure ulcer of right buttock, stage 2: Secondary | ICD-10-CM | POA: Diagnosis not present

## 2017-01-02 DIAGNOSIS — L89321 Pressure ulcer of left buttock, stage 1: Secondary | ICD-10-CM | POA: Diagnosis not present

## 2017-01-02 DIAGNOSIS — S066X9D Traumatic subarachnoid hemorrhage with loss of consciousness of unspecified duration, subsequent encounter: Secondary | ICD-10-CM | POA: Diagnosis not present

## 2017-01-03 ENCOUNTER — Ambulatory Visit: Payer: Medicare Other | Admitting: Cardiology

## 2017-01-03 DIAGNOSIS — E6609 Other obesity due to excess calories: Secondary | ICD-10-CM | POA: Diagnosis not present

## 2017-01-03 DIAGNOSIS — E860 Dehydration: Secondary | ICD-10-CM | POA: Diagnosis not present

## 2017-01-03 DIAGNOSIS — J111 Influenza due to unidentified influenza virus with other respiratory manifestations: Secondary | ICD-10-CM | POA: Diagnosis not present

## 2017-01-03 DIAGNOSIS — Z1389 Encounter for screening for other disorder: Secondary | ICD-10-CM | POA: Diagnosis not present

## 2017-01-03 DIAGNOSIS — L89321 Pressure ulcer of left buttock, stage 1: Secondary | ICD-10-CM | POA: Diagnosis not present

## 2017-01-03 DIAGNOSIS — S066X9D Traumatic subarachnoid hemorrhage with loss of consciousness of unspecified duration, subsequent encounter: Secondary | ICD-10-CM | POA: Diagnosis not present

## 2017-01-03 DIAGNOSIS — I609 Nontraumatic subarachnoid hemorrhage, unspecified: Secondary | ICD-10-CM | POA: Diagnosis not present

## 2017-01-03 DIAGNOSIS — L89312 Pressure ulcer of right buttock, stage 2: Secondary | ICD-10-CM | POA: Diagnosis not present

## 2017-01-03 DIAGNOSIS — E119 Type 2 diabetes mellitus without complications: Secondary | ICD-10-CM | POA: Diagnosis not present

## 2017-01-03 DIAGNOSIS — C3491 Malignant neoplasm of unspecified part of right bronchus or lung: Secondary | ICD-10-CM | POA: Diagnosis not present

## 2017-01-03 DIAGNOSIS — Z6830 Body mass index (BMI) 30.0-30.9, adult: Secondary | ICD-10-CM | POA: Diagnosis not present

## 2017-01-03 DIAGNOSIS — C7931 Secondary malignant neoplasm of brain: Secondary | ICD-10-CM | POA: Diagnosis not present

## 2017-01-04 ENCOUNTER — Other Ambulatory Visit: Payer: Medicare Other

## 2017-01-04 DIAGNOSIS — L89312 Pressure ulcer of right buttock, stage 2: Secondary | ICD-10-CM | POA: Diagnosis not present

## 2017-01-04 DIAGNOSIS — C7931 Secondary malignant neoplasm of brain: Secondary | ICD-10-CM | POA: Diagnosis not present

## 2017-01-04 DIAGNOSIS — C3491 Malignant neoplasm of unspecified part of right bronchus or lung: Secondary | ICD-10-CM | POA: Diagnosis not present

## 2017-01-04 DIAGNOSIS — E119 Type 2 diabetes mellitus without complications: Secondary | ICD-10-CM | POA: Diagnosis not present

## 2017-01-04 DIAGNOSIS — L89321 Pressure ulcer of left buttock, stage 1: Secondary | ICD-10-CM | POA: Diagnosis not present

## 2017-01-04 DIAGNOSIS — S066X9D Traumatic subarachnoid hemorrhage with loss of consciousness of unspecified duration, subsequent encounter: Secondary | ICD-10-CM | POA: Diagnosis not present

## 2017-01-05 DIAGNOSIS — L89312 Pressure ulcer of right buttock, stage 2: Secondary | ICD-10-CM | POA: Diagnosis not present

## 2017-01-05 DIAGNOSIS — L89321 Pressure ulcer of left buttock, stage 1: Secondary | ICD-10-CM | POA: Diagnosis not present

## 2017-01-05 DIAGNOSIS — C3491 Malignant neoplasm of unspecified part of right bronchus or lung: Secondary | ICD-10-CM | POA: Diagnosis not present

## 2017-01-05 DIAGNOSIS — S066X9D Traumatic subarachnoid hemorrhage with loss of consciousness of unspecified duration, subsequent encounter: Secondary | ICD-10-CM | POA: Diagnosis not present

## 2017-01-05 DIAGNOSIS — C7931 Secondary malignant neoplasm of brain: Secondary | ICD-10-CM | POA: Diagnosis not present

## 2017-01-05 DIAGNOSIS — E119 Type 2 diabetes mellitus without complications: Secondary | ICD-10-CM | POA: Diagnosis not present

## 2017-01-06 DIAGNOSIS — L89321 Pressure ulcer of left buttock, stage 1: Secondary | ICD-10-CM | POA: Diagnosis not present

## 2017-01-06 DIAGNOSIS — E119 Type 2 diabetes mellitus without complications: Secondary | ICD-10-CM | POA: Diagnosis not present

## 2017-01-06 DIAGNOSIS — C7931 Secondary malignant neoplasm of brain: Secondary | ICD-10-CM | POA: Diagnosis not present

## 2017-01-06 DIAGNOSIS — S066X9D Traumatic subarachnoid hemorrhage with loss of consciousness of unspecified duration, subsequent encounter: Secondary | ICD-10-CM | POA: Diagnosis not present

## 2017-01-06 DIAGNOSIS — C3491 Malignant neoplasm of unspecified part of right bronchus or lung: Secondary | ICD-10-CM | POA: Diagnosis not present

## 2017-01-06 DIAGNOSIS — L89312 Pressure ulcer of right buttock, stage 2: Secondary | ICD-10-CM | POA: Diagnosis not present

## 2017-01-09 ENCOUNTER — Ambulatory Visit: Payer: Medicare Other | Admitting: Cardiology

## 2017-01-09 ENCOUNTER — Encounter: Payer: Self-pay | Admitting: Cardiology

## 2017-01-09 DIAGNOSIS — E119 Type 2 diabetes mellitus without complications: Secondary | ICD-10-CM | POA: Diagnosis not present

## 2017-01-09 DIAGNOSIS — C7931 Secondary malignant neoplasm of brain: Secondary | ICD-10-CM | POA: Diagnosis not present

## 2017-01-09 DIAGNOSIS — L89312 Pressure ulcer of right buttock, stage 2: Secondary | ICD-10-CM | POA: Diagnosis not present

## 2017-01-09 DIAGNOSIS — C3491 Malignant neoplasm of unspecified part of right bronchus or lung: Secondary | ICD-10-CM | POA: Diagnosis not present

## 2017-01-09 DIAGNOSIS — S066X9D Traumatic subarachnoid hemorrhage with loss of consciousness of unspecified duration, subsequent encounter: Secondary | ICD-10-CM | POA: Diagnosis not present

## 2017-01-09 DIAGNOSIS — L89321 Pressure ulcer of left buttock, stage 1: Secondary | ICD-10-CM | POA: Diagnosis not present

## 2017-01-09 NOTE — Progress Notes (Deleted)
Cardiology Office Note  Date: 01/09/2017   ID: [name], DOB [birth year], MRN [id number]  PCP: Purvis Kilts, MD  Primary Cardiologist: Rozann Lesches, MD   No chief complaint on file.   History of Present Illness: [name] is an 83 y.o. male last seen in December 2017. I reviewed his interval records, he was hospitalized in late January after a fall with subsequently documented left frontal subarachnoid hemorrhage. No surgical intervention was required per Neurosurgery. He was taken off Eliquis with recommendations to stay off for at least a month or longer depending on subsequent Neurosurgical evaluation. CHADSVASC score is 5.  Past Medical History:  Diagnosis Date  . Adenocarcinoma of right lung, stage 4 ([place]) 10/13/2016  . Brain metastasis ([place]) 11/03/2016  . Colon polyps   . Encounter for antineoplastic chemotherapy 12/07/2016  . Enlarged prostate   . Essential hypertension   . Goals of care, counseling/discussion 11/17/2016  . History of kidney stones   . Hypercholesteremia   . Lung cancer ([place]) Dx'd 01/2007   Stage IA non-small cell lung cancer, moderately differentiated adenocarcinoma - VATS and seed implants  . Shortness of breath at rest 10/13/2016  . Thoracic aortic aneurysm ([facility])    4.2 cm in AP diameter   . Type 2 diabetes mellitus ([place])     Past Surgical History:  Procedure Laterality Date  . APPENDECTOMY    . CERVICAL SPINE SURGERY    . CHOLECYSTECTOMY    . COLONOSCOPY  04/17/2012   Procedure: COLONOSCOPY;  Surgeon: Jamesetta So, MD;  Location: [facility];  Service: Gastroenterology;  Laterality: N/A;  . COLONOSCOPY W/ POLYPECTOMY    . TONSILLECTOMY    . wedge resection with seed implantation and node sampling with right VATS  03/13/2007    Current Outpatient Prescriptions  Medication Sig Dispense Refill  . albuterol (PROVENTIL HFA;VENTOLIN HFA) 108 (90 Base) MCG/ACT inhaler Inhale 2 puffs into the lungs every 6 (six) hours as  needed for wheezing or shortness of breath. 1 Inhaler 2  . alfuzosin (UROXATRAL) 10 MG 24 hr tablet Take 10 mg by mouth daily.      Marland Kitchen atorvastatin (LIPITOR) 20 MG tablet Take 1 tablet (20 mg total) by mouth at bedtime. 30 tablet 0  . dexamethasone (DECADRON) 4 MG tablet 4 mg by mouth twice a day the day before, day of and day after the chemotherapy every 3 weeks 40 tablet 1  . folic acid (FOLVITE) 1 MG tablet Take 1 tablet (1 mg total) by mouth daily. 30 tablet 4  . folic acid (FOLVITE) 903 MCG tablet Take 800 mcg by mouth daily.    . furosemide (LASIX) 40 MG tablet Take 1 tablet (40 mg total) by mouth daily as needed. For weight gain. 90 tablet 3  . glipiZIDE (GLUCOTROL) 5 MG tablet Take 5 mg by mouth 2 (two) times daily before a meal.    . guaiFENesin-dextromethorphan (ROBITUSSIN DM) 100-10 MG/5ML syrup Take 5 mLs by mouth every 4 (four) hours as needed for cough. 118 mL 0  . JANUMET 50-1000 MG tablet 1 tablet 2 (two) times daily.    Marland Kitchen LORazepam (ATIVAN) 1 MG tablet Take 0.5 tablets (0.5 mg total) by mouth every 8 (eight) hours as needed for anxiety (30 min before MRI). 15 tablet 0  . losartan (COZAAR) 100 MG tablet Take 100 mg by mouth daily.    . metoprolol (LOPRESSOR) 50 MG tablet Take 1 tablet (50 mg total) by mouth 2 (  two) times daily. 60 tablet 0  . Omega-3 Fatty Acids (FISH OIL) 1000 MG CAPS Take by mouth.    . oseltamivir (TAMIFLU) 75 MG capsule Take 1 capsule (75 mg total) by mouth 2 (two) times daily. 3 capsule 0  . oxybutynin (DITROPAN-XL) 10 MG 24 hr tablet Take 10 mg by mouth at bedtime.    . potassium chloride (K-DUR) 10 MEQ tablet Take 1 tablet (10 mEq total) by mouth daily. 90 tablet 3  . prochlorperazine (COMPAZINE) 10 MG tablet Take 1 tablet (10 mg total) by mouth every 6 (six) hours as needed for nausea or vomiting. (Patient not taking: Reported on 12/07/2016) 30 tablet 0  . Wound Cleansers (RADIAPLEX EX) Apply topically.     No current facility-administered medications for  this visit.    Allergies:  Sulfa antibiotics   Social History: The patient  reports that he quit smoking about 52 years ago. His smoking use included Cigarettes. He has a 22.50 pack-year smoking history. He has never used smokeless tobacco. He reports that he does not drink alcohol or use drugs.   Family History: The patient's family history is not on file.   ROS:  Please see the history of present illness. Otherwise, complete review of systems is positive for {NONE DEFAULTED:18576::"none"}.  All other systems are reviewed and negative.   Physical Exam: VS:  There were no vitals taken for this visit., BMI There is no height or weight on file to calculate BMI.  Wt Readings from Last 3 Encounters:  12/22/16 212 lb 9.6 oz (96.4 kg)  12/07/16 229 lb (103.9 kg)  11/23/16 231 lb 1.6 oz (104.8 kg)    General: Obese elderly male,appears comfortable at rest. HEENT: Conjunctiva and lids normal, oropharynx clear. Neck: Supple, no elevated JVP or carotid bruits, no thyromegaly. Lungs: Clear to auscultation, nonlabored breathing at rest. Cardiac: Irregularly irregular, no S3, softsystolic murmur, no pericardial rub. Abdomen: Protuberant, nontender, bowel sounds present, no guarding or rebound. Extremities: 2-3+ bilateral lower leg edema, distal pulses 2+. Skin: Warm and dry. Musculoskeletal: No kyphosis. Neuropsychiatric: Alert and oriented x3, affect grossly appropriate.  ECG: I personally reviewed the tracing from 11/3021/2018 which showed rapid atrial fibrillation in the 120s, low voltage, and nonspecific ST-T changes.  Recent Labwork: 08/25/2016: TSH 1.225 09/30/2016: B Natriuretic Peptide 72.0 12/18/2016: ALT 30; AST 52 12/21/2016: Magnesium 2.1 12/22/2016: BUN 11; Creatinine, Ser 0.52; Hemoglobin 10.3; Platelets 209; Potassium 3.5; Sodium 136   Other Studies Reviewed Today:  Echocardiogram 08/26/2016: Study Conclusions  - Left ventricle: The cavity size was normal. Wall thickness  was   increased in a pattern of mild LVH. Systolic function was mildly   reduced. The estimated ejection fraction was in the range of 45%   to 50%. Although no diagnostic regional wall motion abnormality   was identified, this possibility cannot be completely excluded on   the basis of this study. The study is not technically sufficient   to allow evaluation of LV diastolic function. - Aortic valve: Moderately calcified annulus. Probably trileaflet;   mildly calcified leaflets. There was mild stenosis. There was   trivial regurgitation. Mean gradient (S): 8 mm Hg. Peak gradient   (S): 15 mm Hg. VTI ratio of LVOT to aortic valve: 0.46. Valve   area (VTI): 1.46 cm^2. Valve area (Vmax): 1.46 cm^2. Valve area   (Vmean): 1.64 cm^2. - Aortic root: The aortic root was mildly dilated. - Mitral valve: Severely calcified annulus. There was trivial   regurgitation. - Left atrium:  The atrium was mildly dilated. - Right atrium: The atrium was mildly dilated. Central venous   pressure (est): 8 mm Hg. - Tricuspid valve: There was mild regurgitation. - Pulmonary arteries: PA peak pressure: 39 mm Hg (S). - Pericardium, extracardiac: A small pericardial effusion was   identified posterior to the heart.  Impressions:  - Mild LVH with LVEF approximately 45-50% in the setting of atrial   fibrillation. Indeterminate diastolic function. Mild left atrial   enlargement. Severe MAC with trivial mitral regurgitation. Mild   calcific aortic stenosis with trivial aortic regurgitation.   Mildly dilated aortic root. Mild tricuspid regurgitation with   PASP 39 mmHg. Small posterior pericardial effusion.  Assessment and Plan:   Current medicines were reviewed with the patient today.  No orders of the defined types were placed in this encounter.   Disposition:  Signed, Satira Sark, MD, [facility] 01/09/2017 10:28 AM    [facility]. [address], [place],  [place] [address] Phone: [phone]; Fax: [phone]

## 2017-01-10 DIAGNOSIS — L89321 Pressure ulcer of left buttock, stage 1: Secondary | ICD-10-CM | POA: Diagnosis not present

## 2017-01-10 DIAGNOSIS — L89312 Pressure ulcer of right buttock, stage 2: Secondary | ICD-10-CM | POA: Diagnosis not present

## 2017-01-10 DIAGNOSIS — C7931 Secondary malignant neoplasm of brain: Secondary | ICD-10-CM | POA: Diagnosis not present

## 2017-01-10 DIAGNOSIS — C3491 Malignant neoplasm of unspecified part of right bronchus or lung: Secondary | ICD-10-CM | POA: Diagnosis not present

## 2017-01-10 DIAGNOSIS — E119 Type 2 diabetes mellitus without complications: Secondary | ICD-10-CM | POA: Diagnosis not present

## 2017-01-10 DIAGNOSIS — S066X9D Traumatic subarachnoid hemorrhage with loss of consciousness of unspecified duration, subsequent encounter: Secondary | ICD-10-CM | POA: Diagnosis not present

## 2017-01-11 ENCOUNTER — Ambulatory Visit (HOSPITAL_BASED_OUTPATIENT_CLINIC_OR_DEPARTMENT_OTHER): Payer: Medicare Other

## 2017-01-11 ENCOUNTER — Encounter: Payer: Self-pay | Admitting: Internal Medicine

## 2017-01-11 ENCOUNTER — Ambulatory Visit (HOSPITAL_BASED_OUTPATIENT_CLINIC_OR_DEPARTMENT_OTHER): Payer: Medicare Other | Admitting: Internal Medicine

## 2017-01-11 ENCOUNTER — Other Ambulatory Visit (HOSPITAL_BASED_OUTPATIENT_CLINIC_OR_DEPARTMENT_OTHER): Payer: Medicare Other

## 2017-01-11 VITALS — BP 126/65 | HR 81 | Temp 97.5°F | Resp 17 | Ht 69.0 in | Wt 204.4 lb

## 2017-01-11 VITALS — BP 135/84 | HR 95 | Temp 97.8°F | Resp 18

## 2017-01-11 DIAGNOSIS — Z5112 Encounter for antineoplastic immunotherapy: Secondary | ICD-10-CM | POA: Diagnosis not present

## 2017-01-11 DIAGNOSIS — Z5111 Encounter for antineoplastic chemotherapy: Secondary | ICD-10-CM | POA: Diagnosis present

## 2017-01-11 DIAGNOSIS — C3411 Malignant neoplasm of upper lobe, right bronchus or lung: Secondary | ICD-10-CM

## 2017-01-11 DIAGNOSIS — R634 Abnormal weight loss: Secondary | ICD-10-CM | POA: Diagnosis not present

## 2017-01-11 DIAGNOSIS — C7931 Secondary malignant neoplasm of brain: Secondary | ICD-10-CM

## 2017-01-11 DIAGNOSIS — J111 Influenza due to unidentified influenza virus with other respiratory manifestations: Secondary | ICD-10-CM

## 2017-01-11 LAB — COMPREHENSIVE METABOLIC PANEL
ALBUMIN: 3.8 g/dL (ref 3.5–5.0)
ALK PHOS: 79 U/L (ref 40–150)
ALT: 18 U/L (ref 0–55)
AST: 18 U/L (ref 5–34)
Anion Gap: 14 mEq/L — ABNORMAL HIGH (ref 3–11)
BUN: 22 mg/dL (ref 7.0–26.0)
CALCIUM: 11.2 mg/dL — AB (ref 8.4–10.4)
CO2: 21 mEq/L — ABNORMAL LOW (ref 22–29)
CREATININE: 0.9 mg/dL (ref 0.7–1.3)
Chloride: 101 mEq/L (ref 98–109)
EGFR: 78 mL/min/{1.73_m2} — ABNORMAL LOW (ref >90)
GLUCOSE: 96 mg/dL (ref 70–140)
Potassium: 4.1 mEq/L (ref 3.5–5.1)
SODIUM: 136 meq/L (ref 136–145)
TOTAL PROTEIN: 7.6 g/dL (ref 6.4–8.3)
Total Bilirubin: 0.54 mg/dL (ref 0.20–1.20)

## 2017-01-11 LAB — CBC WITH DIFFERENTIAL/PLATELET
BASO%: 0.5 % (ref 0.0–2.0)
BASOS ABS: 0 10*3/uL (ref 0.0–0.1)
EOS%: 0.2 % (ref 0.0–7.0)
Eosinophils Absolute: 0 10*3/uL (ref 0.0–0.5)
HEMATOCRIT: 37.2 % — AB (ref 38.4–49.9)
HEMOGLOBIN: 12.2 g/dL — AB (ref 13.0–17.1)
LYMPH#: 0.8 10*3/uL — AB (ref 0.9–3.3)
LYMPH%: 8.8 % — ABNORMAL LOW (ref 14.0–49.0)
MCH: 26.4 pg — ABNORMAL LOW (ref 27.2–33.4)
MCHC: 32.8 g/dL (ref 32.0–36.0)
MCV: 80.6 fL (ref 79.3–98.0)
MONO#: 0.7 10*3/uL (ref 0.1–0.9)
MONO%: 8.1 % (ref 0.0–14.0)
NEUT%: 82.4 % — ABNORMAL HIGH (ref 39.0–75.0)
NEUTROS ABS: 7.3 10*3/uL — AB (ref 1.5–6.5)
Platelets: 207 10*3/uL (ref 140–400)
RBC: 4.62 10*6/uL (ref 4.20–5.82)
RDW: 24.5 % — AB (ref 11.0–14.6)
WBC: 8.9 10*3/uL (ref 4.0–10.3)

## 2017-01-11 LAB — UA PROTEIN, DIPSTICK - CHCC

## 2017-01-11 MED ORDER — PALONOSETRON HCL INJECTION 0.25 MG/5ML
0.2500 mg | Freq: Once | INTRAVENOUS | Status: AC
  Administered 2017-01-11: 0.25 mg via INTRAVENOUS

## 2017-01-11 MED ORDER — FOSAPREPITANT DIMEGLUMINE INJECTION 150 MG
Freq: Once | INTRAVENOUS | Status: AC
  Administered 2017-01-11: 14:00:00 via INTRAVENOUS
  Filled 2017-01-11: qty 5

## 2017-01-11 MED ORDER — SODIUM CHLORIDE 0.9 % IV SOLN
1400.0000 mg | Freq: Once | INTRAVENOUS | Status: AC
  Administered 2017-01-11: 1400 mg via INTRAVENOUS
  Filled 2017-01-11: qty 48

## 2017-01-11 MED ORDER — PEMETREXED DISODIUM CHEMO INJECTION 500 MG
400.0000 mg/m2 | Freq: Once | INTRAVENOUS | Status: AC
  Administered 2017-01-11: 900 mg via INTRAVENOUS
  Filled 2017-01-11: qty 20

## 2017-01-11 MED ORDER — SODIUM CHLORIDE 0.9 % IV SOLN
Freq: Once | INTRAVENOUS | Status: AC
  Administered 2017-01-11: 13:00:00 via INTRAVENOUS

## 2017-01-11 MED ORDER — PALONOSETRON HCL INJECTION 0.25 MG/5ML
INTRAVENOUS | Status: AC
  Filled 2017-01-11: qty 5

## 2017-01-11 MED ORDER — SODIUM CHLORIDE 0.9 % IV SOLN
410.0000 mg | Freq: Once | INTRAVENOUS | Status: AC
  Administered 2017-01-11: 410 mg via INTRAVENOUS
  Filled 2017-01-11: qty 41

## 2017-01-11 NOTE — Progress Notes (Signed)
Cardiology Office Note  Date: 01/13/2017   ID: [name], DOB 01/25/1927, MRN [id number]  PCP: Purvis Kilts, MD  Primary Cardiologist: Rozann Lesches, MD   Chief Complaint  Patient presents with  . Atrial Fibrillation    History of Present Illness: [name] is an 83 y.o. male last seen in December 2017. He presents for a follow-up visit. He is undergoing chemotherapy treatments, follows regularly with oncology. Feels weak, no chest pain or palpitations. He states that his leg edema has improved significantly, still present but better. Current Lasix dose is 40 mg daily. We have increased it at the last visit.  He reports an episode of the flu last month, was on Tamiflu, hospitalized as well.  I reviewed his most recent ECG which is outlined below, also lab work which was obtained earlier this week. Renal function and potassium normal.  Past Medical History:  Diagnosis Date  . Adenocarcinoma of right lung, stage 4 ([place]) 10/13/2016  . Atrial fibrillation ([place])   . Brain metastasis ([place]) 11/03/2016  . Colon polyps   . Encounter for antineoplastic chemotherapy 12/07/2016  . Enlarged prostate   . Essential hypertension   . Goals of care, counseling/discussion 11/17/2016  . History of kidney stones   . Hypercholesteremia   . Lung cancer ([place]) Dx'd 01/2007   Stage IA non-small cell lung cancer, moderately differentiated adenocarcinoma - VATS and seed implants  . Thoracic aortic aneurysm ([facility])    4.2 cm in AP diameter   . Type 2 diabetes mellitus ([place])     Past Surgical History:  Procedure Laterality Date  . APPENDECTOMY    . CERVICAL SPINE SURGERY    . CHOLECYSTECTOMY    . COLONOSCOPY  04/17/2012   Procedure: COLONOSCOPY;  Surgeon: Jamesetta So, MD;  Location: [facility] ENDO [facility];  Service: Gastroenterology;  Laterality: N/A;  . COLONOSCOPY W/ POLYPECTOMY    . TONSILLECTOMY    . wedge resection with seed implantation and node sampling with right VATS   03/13/2007    Current Outpatient Prescriptions  Medication Sig Dispense Refill  . albuterol (PROVENTIL HFA;VENTOLIN HFA) 108 (90 Base) MCG/ACT inhaler Inhale 2 puffs into the lungs every 6 (six) hours as needed for wheezing or shortness of breath. 1 Inhaler 2  . alfuzosin (UROXATRAL) 10 MG 24 hr tablet Take 10 mg by mouth daily.      Marland Kitchen atorvastatin (LIPITOR) 20 MG tablet Take 1 tablet (20 mg total) by mouth at bedtime. 30 tablet 0  . dexamethasone (DECADRON) 4 MG tablet 4 mg by mouth twice a day the day before, day of and day after the chemotherapy every 3 weeks 40 tablet 1  . ELIQUIS 5 MG TABS tablet     . folic acid (FOLVITE) 1 MG tablet Take 1 tablet (1 mg total) by mouth daily. 30 tablet 4  . folic acid (FOLVITE) 035 MCG tablet Take 800 mcg by mouth daily.    . furosemide (LASIX) 40 MG tablet Take 1 tablet (40 mg total) by mouth daily as needed. For weight gain. 90 tablet 3  . glipiZIDE (GLUCOTROL) 5 MG tablet Take 5 mg by mouth 2 (two) times daily before a meal.    . guaiFENesin-dextromethorphan (ROBITUSSIN DM) 100-10 MG/5ML syrup Take 5 mLs by mouth every 4 (four) hours as needed for cough. 118 mL 0  . JANUMET 50-1000 MG tablet 1 tablet 2 (two) times daily.    Marland Kitchen LORazepam (ATIVAN) 1 MG tablet Take 0.5 tablets (  0.5 mg total) by mouth every 8 (eight) hours as needed for anxiety (30 min before MRI). 15 tablet 0  . losartan (COZAAR) 100 MG tablet Take 100 mg by mouth daily.    . metoprolol (LOPRESSOR) 50 MG tablet Take 1 tablet (50 mg total) by mouth 2 (two) times daily. 60 tablet 0  . Omega-3 Fatty Acids (FISH OIL) 1000 MG CAPS Take by mouth.    . oxybutynin (DITROPAN-XL) 10 MG 24 hr tablet Take 10 mg by mouth at bedtime.    . prochlorperazine (COMPAZINE) 10 MG tablet Take 1 tablet (10 mg total) by mouth every 6 (six) hours as needed for nausea or vomiting. 30 tablet 0  . simvastatin (ZOCOR) 40 MG tablet     . Wound Cleansers (RADIAPLEX EX) Apply topically.    . potassium chloride (K-DUR)  10 MEQ tablet Take 1 tablet (10 mEq total) by mouth daily. 90 tablet 3   No current facility-administered medications for this visit.    Allergies:  Sulfa antibiotics   Social History: The patient  reports that he quit smoking about 52 years ago. His smoking use included Cigarettes. He has a 22.50 pack-year smoking history. He has never used smokeless tobacco. He reports that he does not drink alcohol or use drugs.   ROS:  Please see the history of present illness. Otherwise, complete review of systems is positive for weakness.  All other systems are reviewed and negative.   Physical Exam: VS:  BP 116/64   Pulse 99   Ht _0  (1.753 m)   Wt 203 lb (92.1 kg)   SpO2 97%   BMI 29.98 kg/m , BMI Body mass index is 29.98 kg/m.  Wt Readings from Last 3 Encounters:  01/13/17 203 lb (92.1 kg)  01/11/17 204 lb 6.4 oz (92.7 kg)  12/22/16 212 lb 9.6 oz (96.4 kg)    General: Obese elderly male,appears comfortable at rest. HEENT: Conjunctiva and lids normal, oropharynx clear. Neck: Supple, no elevated JVP or carotid bruits, no thyromegaly. Lungs: Clear to auscultation, nonlabored breathing at rest. Cardiac: Irregularly irregular, no S3, softsystolic murmur, no pericardial rub. Abdomen: Protuberant, nontender, bowel sounds present, no guarding or rebound. Extremities: 2+ bilateral lower leg edema, distal pulses 2+. Skin: Warm and dry. Musculoskeletal: No kyphosis. Neuropsychiatric: Alert and oriented x3, affect grossly appropriate.  ECG: I personally reviewed the tracing from 12/20/2016 which showed atrial fibrillation with RVR, low voltage, nonspecific ST-T changes.  Recent Labwork: 08/25/2016: TSH 1.225 09/30/2016: B Natriuretic Peptide 72.0 12/21/2016: Magnesium 2.1 01/11/2017: ALT 18; AST 18; BUN 22.0; Creatinine 0.9; HGB 12.2; Platelets 207; Potassium 4.1; Sodium 136   Other Studies Reviewed Today:  Echocardiogram 08/26/2016: Study Conclusions  - Left ventricle: The cavity size  was normal. Wall thickness was increased in a pattern of mild LVH. Systolic function was mildly reduced. The estimated ejection fraction was in the range of 45% to 50%. Although no diagnostic regional wall motion abnormality was identified, this possibility cannot be completely excluded on the basis of this study. The study is not technically sufficient to allow evaluation of LV diastolic function. - Aortic valve: Moderately calcified annulus. Probably trileaflet; mildly calcified leaflets. There was mild stenosis. There was trivial regurgitation. Mean gradient (S): 8 mm Hg. Peak gradient (S): 15 mm Hg. VTI ratio of LVOT to aortic valve: 0.46. Valve area (VTI): 1.46 cm^2. Valve area (Vmax): 1.46 cm^2. Valve area (Vmean): 1.64 cm^2. - Aortic root: The aortic root was mildly dilated. - Mitral valve: Severely calcified  annulus. There was trivial regurgitation. - Left atrium: The atrium was mildly dilated. - Right atrium: The atrium was mildly dilated. Central venous pressure (est): 8 mm Hg. - Tricuspid valve: There was mild regurgitation. - Pulmonary arteries: PA peak pressure: 39 mm Hg (S). - Pericardium, extracardiac: A small pericardial effusion was identified posterior to the heart.  Impressions:  - Mild LVH with LVEF approximately 45-50% in the setting of atrial fibrillation. Indeterminate diastolic function. Mild left atrial enlargement. Severe MAC with trivial mitral regurgitation. Mild calcific aortic stenosis with trivial aortic regurgitation. Mildly dilated aortic root. Mild tricuspid regurgitation with PASP 39 mmHg. Small posterior pericardial effusion.  Assessment and Plan:  1. Persistent atrial fibrillation, continue strategy of heart rate control and anticoagulation. CHADSVASC score is 5. He is on Eliquis and metoprolol.  2. Leg edema, improved on current Lasix dose although not resolved. Recent lab work reviewed, creatinine and  potassium normal.  3. Multivessel distribution coronary artery calcifications by chest CT imaging. Continue with conservative management. No angina symptoms.  4. Asymptomatic, 4.2 cm thoracic aortic aneurysm.  Current medicines were reviewed with the patient today.  Disposition: Follow-up in 3 months.  Signed, Satira Sark, MD, [facility] 01/13/2017 9:10 AM    [facility] [address], [place], [place] [address] Phone: [phone]; Fax: [phone]

## 2017-01-11 NOTE — Patient Instructions (Signed)
[facility] Discharge Instructions for Patients Receiving Chemotherapy  Today you received the following chemotherapy agents:  Avastin, alimta, Carboplatin  To help prevent nausea and vomiting after your treatment, we encourage you to take your nausea medication.   If you develop nausea and vomiting that is not controlled by your nausea medication, call the clinic.   BELOW ARE SYMPTOMS THAT SHOULD BE REPORTED IMMEDIATELY:  *FEVER GREATER THAN 100.5 F  *CHILLS WITH OR WITHOUT FEVER  NAUSEA AND VOMITING THAT IS NOT CONTROLLED WITH YOUR NAUSEA MEDICATION  *UNUSUAL SHORTNESS OF BREATH  *UNUSUAL BRUISING OR BLEEDING  TENDERNESS IN MOUTH AND THROAT WITH OR WITHOUT PRESENCE OF ULCERS  *URINARY PROBLEMS  *BOWEL PROBLEMS  UNUSUAL RASH Items with * indicate a potential emergency and should be followed up as soon as possible.  Feel free to call the clinic you have any questions or concerns. The clinic phone number is [phone].  Please show the [facility] at check-in to the Emergency Department and triage nurse.

## 2017-01-11 NOTE — Progress Notes (Signed)
[place] [phone]   [phone]  OFFICE PROGRESS NOTE  Purvis Kilts, MD [address] [place] [address]  DIAGNOSIS: Stage IV (T3, N2, M1b) poorly differentiated adenocarcinoma diagnosed in November 2017. He was initially diagnosed with a stage IA non-small cell lung cancer, moderately differentiated adenocarcinoma in April 2008. PDL 1 expression 0%.  MOLECULAR STUDIES: Genomic Alterations Identified? BRCA2 R2494* AKT2 amplification - equivocal? TP53 A23f*35 Additional Findings? Microsatellite status MS-Stable Tumor Mutation Burden TMB-Intermediate; 12 Muts/Mb Additional Disease-relevant Genes with No Reportable Alterations Identified? EGFR KRAS ALK BRAF MET RET ERBB2 ROS1  PRIOR THERAPY: 1) status post wedge resection of the right upper lobe with seed implants under the care of Dr. BArlyce Diceon 10/13/2007. 2) palliative radiotherapy to the large right upper lobe lung mass under the care of Dr. MTammi Klippel 3) stereotactic radiotherapy to a solitary left thalamic brain metastasis on 11/17/2016.  CURRENT THERAPY: Systemic chemotherapy with carboplatin for AUC of 5, Alimta 500 MG/M2 and Avastin 15 MG/KG every 3 weeks. First dose 08/30/2016. Status post one cycle. Starting from cycle #2 carboplatin will be for AUC of 4 and Alimta for 100 MG/M2.  INTERVAL HISTORY: Andrew [id number].o. male came to the clinic today accompanied by his friend [name] The patient is currently undergoing systemic chemotherapy with carboplatin, Alimta and Avastin every 3 weeks status post 1 cycle. He was supposed to start cycle #2 weeks ago but this was delayed because of recent hospitalization with influenza infection as well as for an significant bruises and ecchymosis on the face. He recovered well and feeling much better today. He denied having any current chest pain but continues to have shortness breath with exertion and mild cough with no  hemoptysis. He denied having any fever or chills. He lost around 8 pounds during the last few weeks with his hospitalization. He is here today for evaluation before starting cycle #2 of his treatment.  MEDICAL HISTORY: Past Medical History:  Diagnosis Date  . Adenocarcinoma of right lung, stage 4 ([facility] 10/13/2016  . Brain metastasis ([facility] 11/03/2016  . Colon polyps   . Encounter for antineoplastic chemotherapy 12/07/2016  . Enlarged prostate   . Essential hypertension   . Goals of care, counseling/discussion 11/17/2016  . History of kidney stones   . Hypercholesteremia   . Lung cancer ([facility] Dx'd 01/2007   Stage IA non-small cell lung cancer, moderately differentiated adenocarcinoma - VATS and seed implants  . Shortness of breath at rest 10/13/2016  . Thoracic aortic aneurysm (HCC)    4.2 cm in AP diameter   . Type 2 diabetes mellitus (HCC)     ALLERGIES:  is allergic to sulfa antibiotics.  MEDICATIONS:  Current Outpatient Prescriptions  Medication Sig Dispense Refill  . albuterol (PROVENTIL HFA;VENTOLIN HFA) 108 (90 Base) MCG/ACT inhaler Inhale 2 puffs into the lungs every 6 (six) hours as needed for wheezing or shortness of breath. 1 Inhaler 2  . alfuzosin (UROXATRAL) 10 MG 24 hr tablet Take 10 mg by mouth daily.      .Marland Kitchenatorvastatin (LIPITOR) 20 MG tablet Take 1 tablet (20 mg total) by mouth at bedtime. 30 tablet 0  . dexamethasone (DECADRON) 4 MG tablet 4 mg by mouth twice a day the day before, day of and day after the chemotherapy every 3 weeks 40 tablet 1  . ELIQUIS 5 MG TABS tablet     . folic acid (FOLVITE) 1 MG tablet Take 1 tablet (1 mg  total) by mouth daily. 30 tablet 4  . folic acid (FOLVITE) 400 MCG tablet Take 800 mcg by mouth daily.    . furosemide (LASIX) 40 MG tablet Take 1 tablet (40 mg total) by mouth daily as needed. For weight gain. 90 tablet 3  . glipiZIDE (GLUCOTROL) 5 MG tablet Take 5 mg by mouth 2 (two) times daily before a meal.    .  guaiFENesin-dextromethorphan (ROBITUSSIN DM) 100-10 MG/5ML syrup Take 5 mLs by mouth every 4 (four) hours as needed for cough. 118 mL 0  . JANUMET 50-1000 MG tablet 1 tablet 2 (two) times daily.    Marland Kitchen LORazepam (ATIVAN) 1 MG tablet Take 0.5 tablets (0.5 mg total) by mouth every 8 (eight) hours as needed for anxiety (30 min before MRI). 15 tablet 0  . losartan (COZAAR) 100 MG tablet Take 100 mg by mouth daily.    . metoprolol (LOPRESSOR) 50 MG tablet Take 1 tablet (50 mg total) by mouth 2 (two) times daily. 60 tablet 0  . Omega-3 Fatty Acids (FISH OIL) 1000 MG CAPS Take by mouth.    . oseltamivir (TAMIFLU) 75 MG capsule Take 1 capsule (75 mg total) by mouth 2 (two) times daily. 3 capsule 0  . oxybutynin (DITROPAN-XL) 10 MG 24 hr tablet Take 10 mg by mouth at bedtime.    . potassium chloride (K-DUR) 10 MEQ tablet Take 1 tablet (10 mEq total) by mouth daily. 90 tablet 3  . prochlorperazine (COMPAZINE) 10 MG tablet Take 1 tablet (10 mg total) by mouth every 6 (six) hours as needed for nausea or vomiting. 30 tablet 0  . simvastatin (ZOCOR) 40 MG tablet     . Wound Cleansers (RADIAPLEX EX) Apply topically.     No current facility-administered medications for this visit.     SURGICAL HISTORY:  Past Surgical History:  Procedure Laterality Date  . APPENDECTOMY    . CERVICAL SPINE SURGERY    . CHOLECYSTECTOMY    . COLONOSCOPY  04/17/2012   Procedure: COLONOSCOPY;  Surgeon: Jamesetta So, MD;  Location: [facility];  Service: Gastroenterology;  Laterality: N/A;  . COLONOSCOPY W/ POLYPECTOMY    . TONSILLECTOMY    . wedge resection with seed implantation and node sampling with right VATS  03/13/2007    REVIEW OF SYSTEMS:  A comprehensive review of systems was negative except for: Constitutional: positive for fatigue Respiratory: positive for cough and dyspnea on exertion Neurological: positive for weakness   PHYSICAL EXAMINATION: General appearance: alert, cooperative, fatigued and no  distress Head: Normocephalic, without obvious abnormality, atraumatic Neck: no adenopathy, no JVD, supple, symmetrical, trachea midline and thyroid not enlarged, symmetric, no tenderness/mass/nodules Lymph nodes: Cervical, supraclavicular, and axillary nodes normal. Resp: dullness to percussion RLL, rubs bilaterally and wheezes bilaterally Back: symmetric, no curvature. ROM normal. No CVA tenderness. Cardio: regular rate and rhythm, S1, S2 normal, no murmur, click, rub or gallop GI: soft, non-tender; bowel sounds normal; no masses,  no organomegaly Extremities: extremities normal, atraumatic, no cyanosis or edema  ECOG PERFORMANCE STATUS: 1 - Symptomatic but completely ambulatory  Blood pressure 126/65, pulse 81, temperature 97.5 F (36.4 C), temperature source Oral, resp. rate 17, height _0  (1.753 m), weight 204 lb 6.4 oz (92.7 kg), SpO2 98 %.  LABORATORY DATA: Lab Results  Component Value Date   WBC 8.9 01/11/2017   HGB 12.2 (L) 01/11/2017   HCT 37.2 (L) 01/11/2017   MCV 80.6 01/11/2017   PLT 207 01/11/2017  Chemistry      Component Value Date/Time   NA 136 01/11/2017 1041   K 4.1 01/11/2017 1041   CL 102 12/22/2016 0551   CL 104 10/30/2012 0805   CO2 21 (L) 01/11/2017 1041   BUN 22.0 01/11/2017 1041   CREATININE 0.9 01/11/2017 1041      Component Value Date/Time   CALCIUM 11.2 (H) 01/11/2017 1041   ALKPHOS 79 01/11/2017 1041   AST 18 01/11/2017 1041   ALT 18 01/11/2017 1041   BILITOT 0.54 01/11/2017 1041       RADIOGRAPHIC STUDIES: Dg Chest 2 View  Result Date: 12/18/2016 CLINICAL DATA:  Cough. Fall today. Undergoing chemotherapy for lung cancer. EXAM: CHEST  2 VIEW COMPARISON:  Chest radiographs and CT 09/30/2016 FINDINGS: Cardiac silhouette remains mildly enlarged. Aortic atherosclerosis is noted. Mediastinal contours are unchanged. Brachia therapy seeds are again seen associated with an anterior right upper lobe mass. The mass itself is less well seen  compared to the prior radiographs, potentially indicating a reduction in size. There is a persistent small right pleural effusion with similar appearance of patchy basilar right lung opacity. There may be a trace left pleural effusion. The left lung is grossly clear. No acute osseous abnormality is seen. IMPRESSION: 1. Anterior right upper lobe mass, potentially decreased in size though not well of elevated by radiographs. 2. Persistent small right pleural effusion. 3. Unchanged right basilar opacity which may reflect atelectasis. 4. Aortic atherosclerosis. Electronically Signed   By: Logan Bores M.D.   On: 12/18/2016 21:31   Ct Head Wo Contrast  Result Date: 12/19/2016 CLINICAL DATA:  Acute onset of generalized weakness. Current history of metastatic lung adenocarcinoma. Initial encounter. EXAM: CT HEAD WITHOUT CONTRAST TECHNIQUE: Contiguous axial images were obtained from the base of the skull through the vertex without intravenous contrast. COMPARISON:  CT of the head performed 12/18/2016 FINDINGS: Brain: No evidence of acute infarction, hydrocephalus, extra-axial collection or mass lesion/mass effect. The previously noted small focus of acute subarachnoid hemorrhage at the left frontoparietal region is less apparent than on the prior study. Prominence of the ventricles and sulci reflects mild to moderate cortical volume loss. Mild cerebellar atrophy is noted. Scattered periventricular white matter change likely reflects small vessel ischemic microangiopathy. The brainstem and fourth ventricle are within normal limits. The basal ganglia are unremarkable in appearance. The cerebral hemispheres demonstrate grossly normal gray-white differentiation. No mass effect or midline shift is seen. Vascular: No hyperdense vessel or unexpected calcification. Skull: There is no evidence of fracture; visualized osseous structures are unremarkable in appearance. Sinuses/Orbits: The orbits are within normal limits. Mild  mucosal thickening is noted at the maxillary sinuses bilaterally. The remaining paranasal sinuses and mastoid air cells are well-aerated. Other: No significant soft tissue abnormalities are seen. IMPRESSION: 1. No acute intracranial pathology seen on CT. 2. Previously noted small focus of acute subarachnoid hemorrhage at the left frontoparietal region is less apparent than on the prior study. 3. Mild to moderate cortical volume loss and scattered small vessel ischemic microangiopathy. 4. Mild mucosal thickening at the maxillary sinuses bilaterally. Electronically Signed   By: Garald Balding M.D.   On: 12/19/2016 22:44   Ct Head Wo Contrast  Result Date: 12/18/2016 CLINICAL DATA:  83 year old male with fall. History of metastatic lung cancer. EXAM: CT HEAD WITHOUT CONTRAST CT CERVICAL SPINE WITHOUT CONTRAST TECHNIQUE: Multidetector CT imaging of the head and cervical spine was performed following the standard protocol without intravenous contrast. Multiplanar CT image reconstructions of the cervical  spine were also generated. COMPARISON:  Brain MRI dated 11/14/2016 FINDINGS: CT HEAD FINDINGS Brain: There is a small left frontal subarachnoid hemorrhage (series 13 image 27, sagittal series 6, image 50, and coronal series 5, image 42). No other acute intracranial hemorrhage identified. No mass effect or midline shift. The ventricles and sulci appropriate size for patient's age. Mild periventricular and deep white matter chronic microvascular ischemic changes noted. The previously seen left posterior thalamic metastatic lesion is not well visualized on this noncontrast CT. No mass effect or midline shift noted. No intra-axial fluid collection. Vascular: No hyperdense vessel or unexpected calcification. Skull: Normal. Negative for fracture or focal lesion. Sinuses/Orbits: Mild mucoperiosteal thickening of paranasal sinuses. No air-fluid levels. The mastoid air cells are clear. Bilateral cataract surgeries noted. Mild  right periorbital contusion. Other: None CT CERVICAL SPINE FINDINGS Evaluation of this exam is limited due to motion artifact. Alignment: Normal. Skull base and vertebrae: No acute fracture. Slight irregular appearance of the tip of the odontoid process on the coronal images appears artifactual. No primary bone lesion or focal pathologic process. Soft tissues and spinal canal: No prevertebral fluid or swelling. No visible canal hematoma. Disc levels:  Degenerative changes primarily at C5-C6 and C6-C7. Upper chest: The visualized lung apices are clear. Other: Bilateral carotid bulb atherosclerotic plaques. IMPRESSION: Small left frontal subarachnoid hemorrhage.  Follow-up recommended. Mild age-related atrophy and chronic microvascular ischemic changes. No acute/traumatic cervical spine pathology. These results were called by telephone at the time of interpretation on 12/18/2016 at 10:55 pm to Dr. Brantley Stage , who verbally acknowledged these results. Electronically Signed   By: Anner Crete M.D.   On: 12/18/2016 22:59   Ct Cervical Spine Wo Contrast  Result Date: 12/18/2016 CLINICAL DATA:  83 year old male with fall. History of metastatic lung cancer. EXAM: CT HEAD WITHOUT CONTRAST CT CERVICAL SPINE WITHOUT CONTRAST TECHNIQUE: Multidetector CT imaging of the head and cervical spine was performed following the standard protocol without intravenous contrast. Multiplanar CT image reconstructions of the cervical spine were also generated. COMPARISON:  Brain MRI dated 11/14/2016 FINDINGS: CT HEAD FINDINGS Brain: There is a small left frontal subarachnoid hemorrhage (series 13 image 27, sagittal series 6, image 50, and coronal series 5, image 42). No other acute intracranial hemorrhage identified. No mass effect or midline shift. The ventricles and sulci appropriate size for patient's age. Mild periventricular and deep white matter chronic microvascular ischemic changes noted. The previously seen left posterior  thalamic metastatic lesion is not well visualized on this noncontrast CT. No mass effect or midline shift noted. No intra-axial fluid collection. Vascular: No hyperdense vessel or unexpected calcification. Skull: Normal. Negative for fracture or focal lesion. Sinuses/Orbits: Mild mucoperiosteal thickening of paranasal sinuses. No air-fluid levels. The mastoid air cells are clear. Bilateral cataract surgeries noted. Mild right periorbital contusion. Other: None CT CERVICAL SPINE FINDINGS Evaluation of this exam is limited due to motion artifact. Alignment: Normal. Skull base and vertebrae: No acute fracture. Slight irregular appearance of the tip of the odontoid process on the coronal images appears artifactual. No primary bone lesion or focal pathologic process. Soft tissues and spinal canal: No prevertebral fluid or swelling. No visible canal hematoma. Disc levels:  Degenerative changes primarily at C5-C6 and C6-C7. Upper chest: The visualized lung apices are clear. Other: Bilateral carotid bulb atherosclerotic plaques. IMPRESSION: Small left frontal subarachnoid hemorrhage.  Follow-up recommended. Mild age-related atrophy and chronic microvascular ischemic changes. No acute/traumatic cervical spine pathology. These results were called by telephone at the time  of interpretation on 12/18/2016 at 10:55 pm to Dr. Brantley Stage , who verbally acknowledged these results. Electronically Signed   By: Anner Crete M.D.   On: 12/18/2016 22:59   Dg Chest Port 1 View  Result Date: 12/20/2016 CLINICAL DATA:  Cough, weakness. EXAM: PORTABLE CHEST 1 VIEW COMPARISON:  Radiographs of December 18, 2016. FINDINGS: Stable cardiomediastinal silhouette. Atherosclerosis of thoracic aorta is noted. No pneumothorax is noted. Left lung is clear. Mild right basilar subsegmental atelectasis or infiltrate is noted with minimal associated pleural effusion. Stable presence of brachytherapy seeds are again noted in right upper lobe. IMPRESSION:  Aortic atherosclerosis. Mild right basilar subsegmental atelectasis or infiltrate with minimal associated pleural effusion. Electronically Signed   By: Marijo Conception, M.D.   On: 12/20/2016 09:09    ASSESSMENT AND PLAN:  This is a very pleasant 83 years old white male with metastatic non-small cell lung cancer, adenocarcinoma with solitary brain metastasis status post stereotactic radiotherapy to the solitary brain lesion. The patient is currently on systemic chemotherapy with carboplatin, Alimta and Avastin status post 1 cycle. He tolerated the first cycle well except for fatigue and he had a recent hospitalization with influenza a and recent fall with ecchymosis of the face and eye. We will delay the start of cycle #2 by several weeks until the patient recover from his recent hospitalization. He is feeling much better today. I recommended for him to resume his systemic chemotherapy but we'll reduce the dose of carboplatin to AUC of 4 and Alimta to 400 MG/M2 every 3 weeks because of his age and other comorbidities. I will see him back for follow-up visit in 3 weeks for evaluation before starting cycle #3. He was advised to call immediately if he has any concerning symptoms in the interval. The patient voices understanding of current disease status and treatment options and is in agreement with the current care plan.  All questions were answered. The patient knows to call the clinic with any problems, questions or concerns. We can certainly see the patient much sooner if necessary. I spent 10 minutes counseling the patient face to face. The total time spent in the appointment was 15 minutes.   Disclaimer: This note was dictated with voice recognition software. Similar sounding words can inadvertently be transcribed and may not be corrected upon review.

## 2017-01-12 ENCOUNTER — Telehealth: Payer: Self-pay | Admitting: *Deleted

## 2017-01-12 DIAGNOSIS — C7931 Secondary malignant neoplasm of brain: Secondary | ICD-10-CM | POA: Diagnosis not present

## 2017-01-12 DIAGNOSIS — C3491 Malignant neoplasm of unspecified part of right bronchus or lung: Secondary | ICD-10-CM | POA: Diagnosis not present

## 2017-01-12 DIAGNOSIS — L89321 Pressure ulcer of left buttock, stage 1: Secondary | ICD-10-CM | POA: Diagnosis not present

## 2017-01-12 DIAGNOSIS — S066X9D Traumatic subarachnoid hemorrhage with loss of consciousness of unspecified duration, subsequent encounter: Secondary | ICD-10-CM | POA: Diagnosis not present

## 2017-01-12 DIAGNOSIS — E119 Type 2 diabetes mellitus without complications: Secondary | ICD-10-CM | POA: Diagnosis not present

## 2017-01-12 DIAGNOSIS — L89312 Pressure ulcer of right buttock, stage 2: Secondary | ICD-10-CM | POA: Diagnosis not present

## 2017-01-12 NOTE — Telephone Encounter (Signed)
Per 2/14 LOS and staff message I have scheduled appts. Notified the scheduelr

## 2017-01-13 ENCOUNTER — Ambulatory Visit (INDEPENDENT_AMBULATORY_CARE_PROVIDER_SITE_OTHER): Payer: Medicare Other | Admitting: Cardiology

## 2017-01-13 ENCOUNTER — Encounter: Payer: Self-pay | Admitting: Cardiology

## 2017-01-13 VITALS — BP 116/64 | HR 99 | Ht 69.0 in | Wt 203.0 lb

## 2017-01-13 DIAGNOSIS — I712 Thoracic aortic aneurysm, without rupture, unspecified: Secondary | ICD-10-CM

## 2017-01-13 DIAGNOSIS — I481 Persistent atrial fibrillation: Secondary | ICD-10-CM

## 2017-01-13 DIAGNOSIS — R600 Localized edema: Secondary | ICD-10-CM

## 2017-01-13 DIAGNOSIS — I25810 Atherosclerosis of coronary artery bypass graft(s) without angina pectoris: Secondary | ICD-10-CM | POA: Diagnosis not present

## 2017-01-13 DIAGNOSIS — I4819 Other persistent atrial fibrillation: Secondary | ICD-10-CM

## 2017-01-13 NOTE — Patient Instructions (Addendum)
Medication Instructions:    Your physician recommends that you continue on your current medications as directed. Please refer to the Current Medication list given to you today.  --- If you need a refill on your cardiac medications before your next appointment, please call your pharmacy. ---  Labwork:  None ordered  Testing/Procedures:  None ordered  Follow-Up:  Your physician recommends that you schedule a follow-up appointment in: 3 months with Dr. Domenic Polite.  Thank you for choosing [facility]!!

## 2017-01-17 DIAGNOSIS — S066X9D Traumatic subarachnoid hemorrhage with loss of consciousness of unspecified duration, subsequent encounter: Secondary | ICD-10-CM | POA: Diagnosis not present

## 2017-01-17 DIAGNOSIS — C3491 Malignant neoplasm of unspecified part of right bronchus or lung: Secondary | ICD-10-CM | POA: Diagnosis not present

## 2017-01-17 DIAGNOSIS — L89312 Pressure ulcer of right buttock, stage 2: Secondary | ICD-10-CM | POA: Diagnosis not present

## 2017-01-17 DIAGNOSIS — C7931 Secondary malignant neoplasm of brain: Secondary | ICD-10-CM | POA: Diagnosis not present

## 2017-01-17 DIAGNOSIS — E119 Type 2 diabetes mellitus without complications: Secondary | ICD-10-CM | POA: Diagnosis not present

## 2017-01-17 DIAGNOSIS — L89321 Pressure ulcer of left buttock, stage 1: Secondary | ICD-10-CM | POA: Diagnosis not present

## 2017-01-18 ENCOUNTER — Encounter (HOSPITAL_COMMUNITY)
Admission: RE | Admit: 2017-01-18 | Discharge: 2017-01-18 | Disposition: A | Discharge status: Home / Self Care | Payer: Medicare Other | Source: Ambulatory Visit | Attending: Internal Medicine | Admitting: Internal Medicine

## 2017-01-18 ENCOUNTER — Other Ambulatory Visit (HOSPITAL_COMMUNITY): Payer: Self-pay | Admitting: Family Medicine

## 2017-01-18 ENCOUNTER — Ambulatory Visit (HOSPITAL_COMMUNITY)
Admission: RE | Admit: 2017-01-18 | Discharge: 2017-01-18 | Disposition: A | Discharge status: Home / Self Care | Payer: Medicare Other | Source: Ambulatory Visit | Attending: Family Medicine | Admitting: Family Medicine

## 2017-01-18 ENCOUNTER — Telehealth: Payer: Self-pay

## 2017-01-18 ENCOUNTER — Telehealth: Payer: Self-pay | Admitting: Medical Oncology

## 2017-01-18 DIAGNOSIS — I2510 Atherosclerotic heart disease of native coronary artery without angina pectoris: Secondary | ICD-10-CM | POA: Diagnosis not present

## 2017-01-18 DIAGNOSIS — I700 Atherosclerosis of aorta: Secondary | ICD-10-CM | POA: Diagnosis not present

## 2017-01-18 DIAGNOSIS — J984 Other disorders of lung: Secondary | ICD-10-CM | POA: Insufficient documentation

## 2017-01-18 DIAGNOSIS — C3411 Malignant neoplasm of upper lobe, right bronchus or lung: Secondary | ICD-10-CM | POA: Diagnosis not present

## 2017-01-18 DIAGNOSIS — R05 Cough: Secondary | ICD-10-CM | POA: Diagnosis present

## 2017-01-18 DIAGNOSIS — R059 Cough, unspecified: Secondary | ICD-10-CM

## 2017-01-18 LAB — CBC WITH DIFFERENTIAL/PLATELET
BASOS ABS: 0 10*3/uL (ref 0.0–0.1)
Basophils Relative: 0 %
EOS ABS: 0 10*3/uL (ref 0.0–0.7)
Eosinophils Relative: 1 %
HCT: 37.9 % — ABNORMAL LOW (ref 39.0–52.0)
Hemoglobin: 12.5 g/dL — ABNORMAL LOW (ref 13.0–17.0)
LYMPHS ABS: 0.6 10*3/uL — AB (ref 0.7–4.0)
Lymphocytes Relative: 32 %
MCH: 26.5 pg (ref 26.0–34.0)
MCHC: 33 g/dL (ref 30.0–36.0)
MCV: 80.3 fL (ref 78.0–100.0)
Monocytes Absolute: 0.1 10*3/uL (ref 0.1–1.0)
Monocytes Relative: 7 %
NEUTROS ABS: 1.1 10*3/uL — AB (ref 1.7–7.7)
Neutrophils Relative %: 60 %
PLATELETS: 104 10*3/uL — AB (ref 150–400)
RBC: 4.72 MIL/uL (ref 4.22–5.81)
RDW: 21.6 % — ABNORMAL HIGH (ref 11.5–15.5)
WBC: 1.8 10*3/uL — ABNORMAL LOW (ref 4.0–10.5)

## 2017-01-18 LAB — COMPREHENSIVE METABOLIC PANEL
ALT: 13 U/L — AB (ref 17–63)
AST: 21 U/L (ref 15–41)
Albumin: 3.5 g/dL (ref 3.5–5.0)
Alkaline Phosphatase: 62 U/L (ref 38–126)
Anion gap: 13 (ref 5–15)
BUN: 36 mg/dL — ABNORMAL HIGH (ref 6–20)
CALCIUM: 10.3 mg/dL (ref 8.9–10.3)
CHLORIDE: 94 mmol/L — AB (ref 101–111)
CO2: 24 mmol/L (ref 22–32)
CREATININE: 0.93 mg/dL (ref 0.61–1.24)
Glucose, Bld: 208 mg/dL — ABNORMAL HIGH (ref 65–99)
Potassium: 3.8 mmol/L (ref 3.5–5.1)
Sodium: 131 mmol/L — ABNORMAL LOW (ref 135–145)
TOTAL PROTEIN: 7.3 g/dL (ref 6.5–8.1)
Total Bilirubin: 1.7 mg/dL — ABNORMAL HIGH (ref 0.3–1.2)

## 2017-01-18 NOTE — Telephone Encounter (Signed)
Clarise Cruz RN from [facility] called requesting labs be drawn at home using [facility] to save pt drive to [facility]. This would be starting next week. Today he is going to [facility] for labs but also a CXR d/t chest congestion and cough. Clarise Cruz can take verbal order [phone] or fax order to office [phone].

## 2017-01-18 NOTE — Telephone Encounter (Signed)
Faxed lab orders 

## 2017-01-18 NOTE — Telephone Encounter (Signed)
faxed lab to [facility] to draw weekly

## 2017-01-19 DIAGNOSIS — L89312 Pressure ulcer of right buttock, stage 2: Secondary | ICD-10-CM | POA: Diagnosis not present

## 2017-01-19 DIAGNOSIS — L89321 Pressure ulcer of left buttock, stage 1: Secondary | ICD-10-CM | POA: Diagnosis not present

## 2017-01-19 DIAGNOSIS — C7931 Secondary malignant neoplasm of brain: Secondary | ICD-10-CM | POA: Diagnosis not present

## 2017-01-19 DIAGNOSIS — E119 Type 2 diabetes mellitus without complications: Secondary | ICD-10-CM | POA: Diagnosis not present

## 2017-01-19 DIAGNOSIS — C3491 Malignant neoplasm of unspecified part of right bronchus or lung: Secondary | ICD-10-CM | POA: Diagnosis not present

## 2017-01-19 DIAGNOSIS — S066X9D Traumatic subarachnoid hemorrhage with loss of consciousness of unspecified duration, subsequent encounter: Secondary | ICD-10-CM | POA: Diagnosis not present

## 2017-01-20 DIAGNOSIS — S066X9D Traumatic subarachnoid hemorrhage with loss of consciousness of unspecified duration, subsequent encounter: Secondary | ICD-10-CM | POA: Diagnosis not present

## 2017-01-20 DIAGNOSIS — L89321 Pressure ulcer of left buttock, stage 1: Secondary | ICD-10-CM | POA: Diagnosis not present

## 2017-01-20 DIAGNOSIS — C7931 Secondary malignant neoplasm of brain: Secondary | ICD-10-CM | POA: Diagnosis not present

## 2017-01-20 DIAGNOSIS — C3491 Malignant neoplasm of unspecified part of right bronchus or lung: Secondary | ICD-10-CM | POA: Diagnosis not present

## 2017-01-20 DIAGNOSIS — L89312 Pressure ulcer of right buttock, stage 2: Secondary | ICD-10-CM | POA: Diagnosis not present

## 2017-01-20 DIAGNOSIS — E119 Type 2 diabetes mellitus without complications: Secondary | ICD-10-CM | POA: Diagnosis not present

## 2017-01-23 ENCOUNTER — Emergency Department (HOSPITAL_COMMUNITY): Payer: Medicare Other

## 2017-01-23 ENCOUNTER — Encounter (HOSPITAL_COMMUNITY): Payer: Self-pay | Admitting: Emergency Medicine

## 2017-01-23 ENCOUNTER — Emergency Department (HOSPITAL_COMMUNITY)
Admission: EM | Admit: 2017-01-23 | Discharge: 2017-01-23 | Disposition: A | Discharge status: Home / Self Care | Payer: Medicare Other | Attending: Emergency Medicine | Admitting: Emergency Medicine

## 2017-01-23 ENCOUNTER — Telehealth: Payer: Self-pay | Admitting: Medical Oncology

## 2017-01-23 DIAGNOSIS — R509 Fever, unspecified: Secondary | ICD-10-CM | POA: Diagnosis not present

## 2017-01-23 DIAGNOSIS — R531 Weakness: Secondary | ICD-10-CM

## 2017-01-23 DIAGNOSIS — Z7984 Long term (current) use of oral hypoglycemic drugs: Secondary | ICD-10-CM | POA: Insufficient documentation

## 2017-01-23 DIAGNOSIS — C7931 Secondary malignant neoplasm of brain: Secondary | ICD-10-CM | POA: Diagnosis not present

## 2017-01-23 DIAGNOSIS — R061 Stridor: Secondary | ICD-10-CM | POA: Diagnosis not present

## 2017-01-23 DIAGNOSIS — E119 Type 2 diabetes mellitus without complications: Secondary | ICD-10-CM | POA: Insufficient documentation

## 2017-01-23 DIAGNOSIS — S066X9D Traumatic subarachnoid hemorrhage with loss of consciousness of unspecified duration, subsequent encounter: Secondary | ICD-10-CM | POA: Diagnosis not present

## 2017-01-23 DIAGNOSIS — I6789 Other cerebrovascular disease: Secondary | ICD-10-CM | POA: Diagnosis not present

## 2017-01-23 DIAGNOSIS — C3491 Malignant neoplasm of unspecified part of right bronchus or lung: Secondary | ICD-10-CM | POA: Diagnosis not present

## 2017-01-23 DIAGNOSIS — I5022 Chronic systolic (congestive) heart failure: Secondary | ICD-10-CM | POA: Insufficient documentation

## 2017-01-23 DIAGNOSIS — Z85118 Personal history of other malignant neoplasm of bronchus and lung: Secondary | ICD-10-CM | POA: Diagnosis not present

## 2017-01-23 DIAGNOSIS — Z87891 Personal history of nicotine dependence: Secondary | ICD-10-CM | POA: Diagnosis not present

## 2017-01-23 DIAGNOSIS — R05 Cough: Secondary | ICD-10-CM | POA: Diagnosis not present

## 2017-01-23 DIAGNOSIS — F4489 Other dissociative and conversion disorders: Secondary | ICD-10-CM | POA: Diagnosis not present

## 2017-01-23 DIAGNOSIS — Z79899 Other long term (current) drug therapy: Secondary | ICD-10-CM | POA: Insufficient documentation

## 2017-01-23 DIAGNOSIS — L89312 Pressure ulcer of right buttock, stage 2: Secondary | ICD-10-CM | POA: Diagnosis not present

## 2017-01-23 DIAGNOSIS — I110 Hypertensive heart disease with heart failure: Secondary | ICD-10-CM | POA: Diagnosis not present

## 2017-01-23 DIAGNOSIS — R062 Wheezing: Secondary | ICD-10-CM | POA: Diagnosis not present

## 2017-01-23 DIAGNOSIS — L89321 Pressure ulcer of left buttock, stage 1: Secondary | ICD-10-CM | POA: Diagnosis not present

## 2017-01-23 LAB — CBC WITH DIFFERENTIAL/PLATELET
BASOS PCT: 0 %
Basophils Absolute: 0 10*3/uL (ref 0.0–0.1)
Eosinophils Absolute: 0 10*3/uL (ref 0.0–0.7)
Eosinophils Relative: 1 %
HEMATOCRIT: 32.1 % — AB (ref 39.0–52.0)
HEMOGLOBIN: 10.8 g/dL — AB (ref 13.0–17.0)
LYMPHS PCT: 34 %
Lymphs Abs: 0.8 10*3/uL (ref 0.7–4.0)
MCH: 26.6 pg (ref 26.0–34.0)
MCHC: 33.6 g/dL (ref 30.0–36.0)
MCV: 79.1 fL (ref 78.0–100.0)
MONOS PCT: 14 %
Monocytes Absolute: 0.4 10*3/uL (ref 0.1–1.0)
NEUTROS ABS: 1.3 10*3/uL — AB (ref 1.7–7.7)
NEUTROS PCT: 52 %
Platelets: 46 10*3/uL — ABNORMAL LOW (ref 150–400)
RBC: 4.06 MIL/uL — ABNORMAL LOW (ref 4.22–5.81)
RDW: 20.1 % — ABNORMAL HIGH (ref 11.5–15.5)
WBC: 2.5 10*3/uL — ABNORMAL LOW (ref 4.0–10.5)

## 2017-01-23 LAB — I-STAT CG4 LACTIC ACID, ED
LACTIC ACID, VENOUS: 1.41 mmol/L (ref 0.5–1.9)
Lactic Acid, Venous: 2.77 mmol/L (ref 0.5–1.9)

## 2017-01-23 LAB — URINALYSIS, ROUTINE W REFLEX MICROSCOPIC
BILIRUBIN URINE: NEGATIVE
Glucose, UA: NEGATIVE mg/dL
HGB URINE DIPSTICK: NEGATIVE
KETONES UR: NEGATIVE mg/dL
NITRITE: NEGATIVE
PROTEIN: NEGATIVE mg/dL
Specific Gravity, Urine: 1.009 (ref 1.005–1.030)
pH: 6 (ref 5.0–8.0)

## 2017-01-23 LAB — COMPREHENSIVE METABOLIC PANEL
ALBUMIN: 3.1 g/dL — AB (ref 3.5–5.0)
ALK PHOS: 62 U/L (ref 38–126)
ALT: 19 U/L (ref 17–63)
ANION GAP: 9 (ref 5–15)
AST: 27 U/L (ref 15–41)
BUN: 18 mg/dL (ref 6–20)
CALCIUM: 10 mg/dL (ref 8.9–10.3)
CO2: 28 mmol/L (ref 22–32)
Chloride: 90 mmol/L — ABNORMAL LOW (ref 101–111)
Creatinine, Ser: 0.69 mg/dL (ref 0.61–1.24)
GFR calc non Af Amer: >60 mL/min (ref >60)
GLUCOSE: 85 mg/dL (ref 65–99)
POTASSIUM: 4.4 mmol/L (ref 3.5–5.1)
SODIUM: 127 mmol/L — AB (ref 135–145)
TOTAL PROTEIN: 6.5 g/dL (ref 6.5–8.1)
Total Bilirubin: 0.7 mg/dL (ref 0.3–1.2)

## 2017-01-23 LAB — INFLUENZA PANEL BY PCR (TYPE A & B)
INFLAPCR: POSITIVE — AB
INFLBPCR: NEGATIVE

## 2017-01-23 MED ORDER — ACETAMINOPHEN 325 MG PO TABS
650.0000 mg | ORAL_TABLET | Freq: Once | ORAL | Status: AC
  Administered 2017-01-23: 650 mg via ORAL
  Filled 2017-01-23: qty 2

## 2017-01-23 MED ORDER — OSELTAMIVIR PHOSPHATE 75 MG PO CAPS
75.0000 mg | ORAL_CAPSULE | Freq: Once | ORAL | Status: AC
Start: 2017-01-23 — End: 2017-01-23
  Administered 2017-01-23: 75 mg via ORAL
  Filled 2017-01-23: qty 1

## 2017-01-23 MED ORDER — SODIUM CHLORIDE 0.9 % IV BOLUS (SEPSIS)
2200.0000 mL | Freq: Once | INTRAVENOUS | Status: AC
  Administered 2017-01-23: 2200 mL via INTRAVENOUS

## 2017-01-23 MED ORDER — OSELTAMIVIR PHOSPHATE 75 MG PO CAPS: 75.0000 mg | ORAL_CAPSULE | Freq: Two times a day (BID) | ORAL | 0 refills | Status: DC

## 2017-01-23 MED ORDER — SODIUM CHLORIDE 0.9 % IV BOLUS (SEPSIS)
500.0000 mL | Freq: Once | INTRAVENOUS | Status: AC
  Administered 2017-01-23: 500 mL via INTRAVENOUS

## 2017-01-23 NOTE — Telephone Encounter (Signed)
Son called to report pt fell last night . [facility] is evaluating him this am . [name] is wanting prognosis information

## 2017-01-23 NOTE — Telephone Encounter (Signed)
Left message for [name] to return my call. I do not see him on the HIPPA form.

## 2017-01-23 NOTE — ED Notes (Addendum)
Pt and family updated on plan of care,  Pt states that he is feeling better,

## 2017-01-23 NOTE — Telephone Encounter (Addendum)
I spoke to [place] and OT saw pt in the home today and contacted , Dr Armandina Gemma.

## 2017-01-23 NOTE — ED Notes (Signed)
Dr Eulis Foster notified of positive flu results, no additional orders given, pt's daughter Sharyn Lull notified of results, [phone]

## 2017-01-23 NOTE — Telephone Encounter (Addendum)
I spoke to [name] and he said pt going by EMS to hospital. Pt admitted to hospital after falling. I told son to for pt to keep appt next week to discuss plan of care.

## 2017-01-23 NOTE — Discharge Instructions (Signed)
It is very important to drink a lot of fluid and eat 3 meals each day.  Start the prescription for Tamiflu in the morning.  I think it makes sense to take the Tamiflu regardless of the results of the influenza testing, today.  Follow up with your doctor for a checkup in a few days  Return here if needed for problems.

## 2017-01-23 NOTE — ED Provider Notes (Signed)
[facility] DEPT Provider Note   CSN: [id number] Arrival date & time: 01/23/17  1522     History   Chief Complaint Chief Complaint  Patient presents with  . Weakness    HPI [name] is a 83 y.o. male.  He c/o rhinorrhea, weakness and cough for several days. Presents per EMS from home, where, he states, he lives alone. Recent chemo, 01/11/17. Saw Cardiology 01/13/17, for check up on AF and leg edema. [place] score 5, on Eliquis. Hospitalized 1 month ago with SAH, Sepsis, AF with RVR, and Influenza. He denies fever, vomiting focal weakness ir paresthesias. There are no other known modifying factors.   HPI  Past Medical History:  Diagnosis Date  . Adenocarcinoma of right lung, stage 4 ([place]) 10/13/2016  . Atrial fibrillation ([place])   . Brain metastasis ([place]) 11/03/2016  . Colon polyps   . Encounter for antineoplastic chemotherapy 12/07/2016  . Enlarged prostate   . Essential hypertension   . Goals of care, counseling/discussion 11/17/2016  . History of kidney stones   . Hypercholesteremia   . Lung cancer ([place]) Dx'd 01/2007   Stage IA non-small cell lung cancer, moderately differentiated adenocarcinoma - VATS and seed implants  . Thoracic aortic aneurysm ([facility])    4.2 cm in AP diameter   . Type 2 diabetes mellitus [facility])     Patient Active Problem List   Diagnosis Date Noted  . Influenza with respiratory manifestation 12/22/2016  . Dehydration 12/22/2016  . Chronic systolic CHF (congestive heart failure) ([place]) 12/22/2016  . Sepsis ([place]) 12/21/2016  . SAH (subarachnoid hemorrhage) ([place]) 12/19/2016  . Fall 12/19/2016  . Encounter for antineoplastic chemotherapy 12/07/2016  . Bronchitis 11/23/2016  . Peripheral edema 11/23/2016  . Goals of care, counseling/discussion 11/17/2016  . 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 Palliative care by specialist   . DNR (do not resuscitate)   . Adenocarcinoma of right lung, stage 4 ([place]) 10/13/2016  . Shortness  of breath at rest 10/13/2016  . Atrial fibrillation with rapid ventricular response ([place]) 08/25/2016  . Chest pain 08/25/2016  . DM type 2 (diabetes mellitus, type 2) ([place]) 08/25/2016  . Aortic atherosclerosis ([place]) 08/25/2016  . Thoracic aortic aneurysm ([place]) 08/25/2016  . Hx of cancer of lung 11/07/2011  . Hypercholesteremia   . History of kidney stones     Past Surgical History:  Procedure Laterality Date  . APPENDECTOMY    . CERVICAL SPINE SURGERY    . CHOLECYSTECTOMY    . COLONOSCOPY  04/17/2012   Procedure: COLONOSCOPY;  Surgeon: Jamesetta So, MD;  Location: [facility];  Service: Gastroenterology;  Laterality: N/A;  . COLONOSCOPY W/ POLYPECTOMY    . TONSILLECTOMY    . wedge resection with seed implantation and node sampling with right VATS  03/13/2007       Home Medications    Prior to Admission medications   Medication Sig Start Date End Date Taking? Authorizing Provider  albuterol (PROVENTIL HFA;VENTOLIN HFA) 108 (90 Base) MCG/ACT inhaler Inhale 2 puffs into the lungs every 6 (six) hours as needed for wheezing or shortness of breath. 10/13/16  Yes Curt Bears, MD  alfuzosin (UROXATRAL) 10 MG 24 hr tablet Take 10 mg by mouth daily.     Yes Historical Provider, MD  atorvastatin (LIPITOR) 20 MG tablet Take 1 tablet (20 mg total) by mouth at bedtime. 12/22/16  Yes Debbe Odea, MD  dexamethasone (DECADRON) 4 MG tablet 4 mg by mouth twice a day  the day before, day of and day after the chemotherapy every 3 weeks 11/17/16  Yes Curt Bears, MD  ELIQUIS 5 MG TABS tablet Take 5 mg by mouth 2 (two) times daily.  11/08/16  Yes Historical Provider, MD  folic acid (FOLVITE) 1 MG tablet Take 1 tablet (1 mg total) by mouth daily. 11/17/16  Yes Curt Bears, MD  furosemide (LASIX) 40 MG tablet Take 1 tablet (40 mg total) by mouth daily as needed. For weight gain. 12/22/16 03/22/17 Yes Debbe Odea, MD  glipiZIDE (GLUCOTROL) 5 MG tablet Take 5 mg by mouth 2 (two) times daily  before a meal.   Yes Historical Provider, MD  guaiFENesin-dextromethorphan (ROBITUSSIN DM) 100-10 MG/5ML syrup Take 5 mLs by mouth every 4 (four) hours as needed for cough. 12/22/16  Yes Debbe Odea, MD  JANUMET 50-1000 MG tablet 1 tablet 2 (two) times daily. 07/21/16  Yes Historical Provider, MD  LORazepam (ATIVAN) 1 MG tablet Take 0.5 tablets (0.5 mg total) by mouth every 8 (eight) hours as needed for anxiety (30 min before MRI). 11/08/16  Yes Tyler Pita, MD  losartan (COZAAR) 100 MG tablet Take 100 mg by mouth daily.   Yes Historical Provider, MD  metoprolol (LOPRESSOR) 50 MG tablet Take 1 tablet (50 mg total) by mouth 2 (two) times daily. 08/26/16  Yes Samuella Cota, MD  oxybutynin (DITROPAN-XL) 10 MG 24 hr tablet Take 10 mg by mouth at bedtime.   Yes Historical Provider, MD  potassium chloride (K-DUR) 10 MEQ tablet Take 1 tablet (10 mEq total) by mouth daily. 09/26/16 01/23/17 Yes Satira Sark, MD  prochlorperazine (COMPAZINE) 10 MG tablet Take 1 tablet (10 mg total) by mouth every 6 (six) hours as needed for nausea or vomiting. 11/17/16  Yes Curt Bears, MD  simvastatin (ZOCOR) 40 MG tablet Take 40 mg by mouth every evening.  12/12/16  Yes Historical Provider, MD  Wound Cleansers (RADIAPLEX EX) Apply 1 application topically daily as needed (for affected area).    Yes Historical Provider, MD  oseltamivir (TAMIFLU) 75 MG capsule Take 1 capsule (75 mg total) by mouth every 12 (twelve) hours. 01/23/17   Daleen Bo, MD    Family History Family History  Problem Relation Age of Onset  . Cancer Mother   . Cancer Sister   . Colon cancer Neg Hx     Social History Social History  Substance Use Topics  . Smoking status: Former Smoker    Packs/day: 1.50    Years: 15.00    Types: Cigarettes    Quit date: 11/28/1964  . Smokeless tobacco: Never Used  . Alcohol use No     Allergies   Sulfa antibiotics   Review of Systems Review of Systems  All other systems reviewed and  are negative.    Physical Exam Updated Vital Signs BP 131/85   Pulse 86   Temp 100 F (37.8 C) (Rectal)   Resp 17   Ht '5\' 9"'$  (1.753 m)   Wt 203 lb (92.1 kg)   SpO2 97%   BMI 29.98 kg/m   Physical Exam  Constitutional: He is oriented to person, place, and time. He appears well-developed.  Dalene Seltzer, frail  HENT:  Head: Normocephalic and atraumatic.  Right Ear: External ear normal.  Left Ear: External ear normal.  Eyes: Conjunctivae and EOM are normal. Pupils are equal, round, and reactive to light.  Neck: Normal range of motion and phonation normal. Neck supple.  Cardiovascular: Normal rate, regular rhythm and normal heart  sounds.   Pulmonary/Chest: Effort normal and breath sounds normal. He exhibits no bony tenderness.  Abdominal: Soft. There is no tenderness. There is no guarding.  Musculoskeletal: Normal range of motion. He exhibits edema (3+ lower legs, bilaterally.).  Neurological: He is alert and oriented to person, place, and time. No cranial nerve deficit or sensory deficit. He exhibits normal muscle tone. Coordination normal.  Skin: Skin is warm, dry and intact.  Presacral skin breakdown, early ulcer.  Psychiatric: He has a normal mood and affect. His behavior is normal.  Nursing note and vitals reviewed.    ED Treatments / Results  Labs (all labs ordered are listed, but only abnormal results are displayed) Labs Reviewed  COMPREHENSIVE METABOLIC PANEL - Abnormal; Notable for the following:       Result Value   Sodium 127 (*)    Chloride 90 (*)    Albumin 3.1 (*)    All other components within normal limits  CBC WITH DIFFERENTIAL/PLATELET - Abnormal; Notable for the following:    WBC 2.5 (*)    RBC 4.06 (*)    Hemoglobin 10.8 (*)    HCT 32.1 (*)    RDW 20.1 (*)    Platelets 46 (*)    Neutro Abs 1.3 (*)    All other components within normal limits  URINALYSIS, ROUTINE W REFLEX MICROSCOPIC - Abnormal; Notable for the following:    Leukocytes, UA MODERATE  (*)    Bacteria, UA RARE (*)    All other components within normal limits  I-STAT CG4 LACTIC ACID, ED - Abnormal; Notable for the following:    Lactic Acid, Venous 2.77 (*)    All other components within normal limits  CULTURE, BLOOD (ROUTINE X 2)  CULTURE, BLOOD (ROUTINE X 2)  INFLUENZA PANEL BY PCR (TYPE A & B)  I-STAT CG4 LACTIC ACID, ED    EKG  EKG Interpretation None       Radiology Dg Chest 2 View  Result Date: 01/23/2017 CLINICAL DATA:  Cough and fever. EXAM: CHEST  2 VIEW COMPARISON:  01/18/2017 . FINDINGS: Mediastinum and hilar structures normal. Cardiomegaly with normal pulmonary vascularity. Low lung volumes with bibasilar pleural-parenchymal thickening again noted consistent with scarring. Developing infiltrate right lower lobe cannot be excluded P Seed implants again noted over the right mid chest . IMPRESSION: Bibasilar pleural-parenchymal thickening again noted consistent scarring. Developing infiltrate right lower lobe cannot be excluded. Electronically Signed   By: Marcello Moores  Register   On: 01/23/2017 17:05    Procedures Procedures (including critical care time)  Medications Ordered in ED Medications  sodium chloride 0.9 % bolus 500 mL (0 mLs Intravenous Stopped 01/23/17 1943)  sodium chloride 0.9 % bolus 2,200 mL (0 mLs Intravenous Stopped 01/23/17 1943)  acetaminophen (TYLENOL) tablet 650 mg (650 mg Oral Given 01/23/17 1753)  oseltamivir (TAMIFLU) capsule 75 mg (75 mg Oral Given 01/23/17 1944)     Initial Impression / Assessment and Plan / ED Course  I have reviewed the triage vital signs and the nursing notes.  Pertinent labs & imaging results that were available during my care of the patient were reviewed by me and considered in my medical decision making (see chart for details).     Medications  sodium chloride 0.9 % bolus 500 mL (0 mLs Intravenous Stopped 01/23/17 1943)  sodium chloride 0.9 % bolus 2,200 mL (0 mLs Intravenous Stopped 01/23/17 1943)    acetaminophen (TYLENOL) tablet 650 mg (650 mg Oral Given 01/23/17 1753)  oseltamivir (TAMIFLU) capsule  75 mg (75 mg Oral Given 01/23/17 1944)    Patient Vitals for the past 24 hrs:  BP Temp Temp src Pulse Resp SpO2 Height Weight  01/23/17 1900 131/85 - - 86 17 97 % - -  01/23/17 1830 128/82 - - 84 23 97 % - -  01/23/17 1800 142/75 - - 85 24 96 % - -  01/23/17 1650 - 100 F (37.8 C) Rectal - - - - -  01/23/17 1526 146/82 98.3 F (36.8 C) Oral 79 18 98 % - -  01/23/17 1524 - - - - - - '5\' 9"'$  (1.753 m) 203 lb (92.1 kg)    7:48 PM Reevaluation with update and discussion. After initial assessment and treatment, an updated evaluation reveals patient is calm and comfortable he is tolerating oral liquids.  He feels better.  Findings discussed with a friend who is with him and tends to his needs.  Findings discussed and all questions answered. Linetta Regner L    Final Clinical Impressions(s) / ED Diagnoses   Final diagnoses:  Weakness  Febrile illness   Malaise weakness and transient altered mental state, with very low-grade fever.  Influenza testing pending at the time of discharge.  I recommended that patient take a 5 day course of Tamiflu regardless of the result of the testing.  Doubt severe sepsis or impending vascular collapse.  Patient's white blood cell count is low but trending toward improvement.  He is not neutropenic.  Nursing Notes Reviewed/ Care Coordinated Applicable Imaging Reviewed Interpretation of Laboratory Data incorporated into ED treatment  The patient appears reasonably screened and/or stabilized for discharge and I doubt any other medical condition or other [facility] requiring further screening, evaluation, or treatment in the ED at this time prior to discharge.  Plan: Home Medications-APAP every 4 hours as needed fever; Home Treatments-rest, fluids; return here if the recommended treatment, does not improve the symptoms; Recommended follow up-PCP checkup in 3  days    New Prescriptions New Prescriptions   OSELTAMIVIR (TAMIFLU) 75 MG CAPSULE    Take 1 capsule (75 mg total) by mouth every 12 (twelve) hours.     Daleen Bo, MD 01/23/17 1950

## 2017-01-23 NOTE — ED Notes (Signed)
Lab does not have a flu swab on pt, per Dr Eulis Foster flu swab can be canceled,

## 2017-01-23 NOTE — ED Triage Notes (Signed)
Pt reports being generally weak for about a week.  Fell last night with no injury.  Pt is currently on chemo for lung cancer.  Last chemo last Wed and began feeling bad about 2 days later.

## 2017-01-23 NOTE — ED Triage Notes (Signed)
Pt also is c/o ulcer on buttock.  Stage 2 breakdown noted to gluteal cleft.

## 2017-01-23 NOTE — ED Notes (Signed)
Update given, pt sitting upright in bed eating [employer], tolerating well,

## 2017-01-24 ENCOUNTER — Telehealth: Payer: Self-pay | Admitting: Medical Oncology

## 2017-01-24 DIAGNOSIS — C3491 Malignant neoplasm of unspecified part of right bronchus or lung: Secondary | ICD-10-CM | POA: Diagnosis not present

## 2017-01-24 DIAGNOSIS — L89321 Pressure ulcer of left buttock, stage 1: Secondary | ICD-10-CM | POA: Diagnosis not present

## 2017-01-24 DIAGNOSIS — S066X9D Traumatic subarachnoid hemorrhage with loss of consciousness of unspecified duration, subsequent encounter: Secondary | ICD-10-CM | POA: Diagnosis not present

## 2017-01-24 DIAGNOSIS — E119 Type 2 diabetes mellitus without complications: Secondary | ICD-10-CM | POA: Diagnosis not present

## 2017-01-24 DIAGNOSIS — L89312 Pressure ulcer of right buttock, stage 2: Secondary | ICD-10-CM | POA: Diagnosis not present

## 2017-01-24 DIAGNOSIS — C7931 Secondary malignant neoplasm of brain: Secondary | ICD-10-CM | POA: Diagnosis not present

## 2017-01-24 NOTE — Telephone Encounter (Signed)
Pt had labs drawn at [facility] last night.

## 2017-01-25 DIAGNOSIS — C3491 Malignant neoplasm of unspecified part of right bronchus or lung: Secondary | ICD-10-CM | POA: Diagnosis not present

## 2017-01-25 DIAGNOSIS — L89312 Pressure ulcer of right buttock, stage 2: Secondary | ICD-10-CM | POA: Diagnosis not present

## 2017-01-25 DIAGNOSIS — C7931 Secondary malignant neoplasm of brain: Secondary | ICD-10-CM | POA: Diagnosis not present

## 2017-01-25 DIAGNOSIS — S066X9D Traumatic subarachnoid hemorrhage with loss of consciousness of unspecified duration, subsequent encounter: Secondary | ICD-10-CM | POA: Diagnosis not present

## 2017-01-25 DIAGNOSIS — L89321 Pressure ulcer of left buttock, stage 1: Secondary | ICD-10-CM | POA: Diagnosis not present

## 2017-01-25 DIAGNOSIS — E119 Type 2 diabetes mellitus without complications: Secondary | ICD-10-CM | POA: Diagnosis not present

## 2017-01-27 DIAGNOSIS — L89312 Pressure ulcer of right buttock, stage 2: Secondary | ICD-10-CM | POA: Diagnosis not present

## 2017-01-27 DIAGNOSIS — C7931 Secondary malignant neoplasm of brain: Secondary | ICD-10-CM | POA: Diagnosis not present

## 2017-01-27 DIAGNOSIS — E119 Type 2 diabetes mellitus without complications: Secondary | ICD-10-CM | POA: Diagnosis not present

## 2017-01-27 DIAGNOSIS — C3491 Malignant neoplasm of unspecified part of right bronchus or lung: Secondary | ICD-10-CM | POA: Diagnosis not present

## 2017-01-27 DIAGNOSIS — S066X9D Traumatic subarachnoid hemorrhage with loss of consciousness of unspecified duration, subsequent encounter: Secondary | ICD-10-CM | POA: Diagnosis not present

## 2017-01-27 DIAGNOSIS — L89321 Pressure ulcer of left buttock, stage 1: Secondary | ICD-10-CM | POA: Diagnosis not present

## 2017-01-28 LAB — CULTURE, BLOOD (ROUTINE X 2): Culture: NO GROWTH

## 2017-01-30 DIAGNOSIS — L89312 Pressure ulcer of right buttock, stage 2: Secondary | ICD-10-CM | POA: Diagnosis not present

## 2017-01-30 DIAGNOSIS — C7931 Secondary malignant neoplasm of brain: Secondary | ICD-10-CM | POA: Diagnosis not present

## 2017-01-30 DIAGNOSIS — C3491 Malignant neoplasm of unspecified part of right bronchus or lung: Secondary | ICD-10-CM | POA: Diagnosis not present

## 2017-01-30 DIAGNOSIS — E119 Type 2 diabetes mellitus without complications: Secondary | ICD-10-CM | POA: Diagnosis not present

## 2017-01-30 DIAGNOSIS — L89321 Pressure ulcer of left buttock, stage 1: Secondary | ICD-10-CM | POA: Diagnosis not present

## 2017-01-30 DIAGNOSIS — S066X9D Traumatic subarachnoid hemorrhage with loss of consciousness of unspecified duration, subsequent encounter: Secondary | ICD-10-CM | POA: Diagnosis not present

## 2017-01-31 ENCOUNTER — Telehealth: Payer: Self-pay | Admitting: *Deleted

## 2017-01-31 MED ORDER — AMBULATORY NON FORMULARY MEDICATION: 0 refills | Status: AC

## 2017-01-31 NOTE — Telephone Encounter (Signed)
Spoke with Niece Altha Harm who requesting magic mouthwash for pt as he has thrush/sores in mouth. Pt unable to come to MD appt tomorrow as pt has home health and lab and PT coming to house. She would also like to change upcoming scan date. Informed christine I will send a message to scheduling to cancel tomorrows MD appt, offered central scheduling # to make scan appt changes, christine declined and verbalized she has this number. Altha Harm also advised Sharyn Lull will be bringing pt to his MD appt and she will inform her to call scheduling to set up a new MD appt. No further concerns. Magic mouthwash called into [facility].

## 2017-01-31 NOTE — Telephone Encounter (Signed)
"  This is [name] with [facility], [place].  My previous call was cut off.  Calling for this patient asking for MMW for mouth sores.  His daughter asked [place] to call.  We called PCP who said no to call oncologist."  Operator reports notifying collaborative nurse of initial call.

## 2017-01-31 NOTE — Telephone Encounter (Signed)
Pt gave this nurse verbal permission to release information to niece Andrey Campanile. Niece Altha Harm called stating that pt was recently discharged from the hospital.  Still feeling very weak, and tired.  Altha Harm wanted to cancel pt's chemo appt for 02/01/17.  Altha Harm would like to know if pt still needs to come in for office visit with Dr. Julien Nordmann on 02/01/17. Pt has appt with his  PCP on Thursday   02/02/17. Altha Harm also would like for his CT scan to be moved up to a sooner date.  Jiles Prows phone number to radiology scheduling dept to reschedule scan. Christine's    Phone    [phone].

## 2017-02-01 ENCOUNTER — Other Ambulatory Visit: Payer: Medicare Other

## 2017-02-01 ENCOUNTER — Ambulatory Visit: Payer: Medicare Other

## 2017-02-01 ENCOUNTER — Ambulatory Visit: Payer: Medicare Other | Admitting: Internal Medicine

## 2017-02-01 DIAGNOSIS — C7931 Secondary malignant neoplasm of brain: Secondary | ICD-10-CM | POA: Diagnosis not present

## 2017-02-01 DIAGNOSIS — S066X9D Traumatic subarachnoid hemorrhage with loss of consciousness of unspecified duration, subsequent encounter: Secondary | ICD-10-CM | POA: Diagnosis not present

## 2017-02-01 DIAGNOSIS — E119 Type 2 diabetes mellitus without complications: Secondary | ICD-10-CM | POA: Diagnosis not present

## 2017-02-01 DIAGNOSIS — L89321 Pressure ulcer of left buttock, stage 1: Secondary | ICD-10-CM | POA: Diagnosis not present

## 2017-02-01 DIAGNOSIS — L89312 Pressure ulcer of right buttock, stage 2: Secondary | ICD-10-CM | POA: Diagnosis not present

## 2017-02-01 DIAGNOSIS — C3491 Malignant neoplasm of unspecified part of right bronchus or lung: Secondary | ICD-10-CM | POA: Diagnosis not present

## 2017-02-01 LAB — CULTURE, BLOOD (ROUTINE X 2): Culture: NO GROWTH

## 2017-02-02 DIAGNOSIS — E6609 Other obesity due to excess calories: Secondary | ICD-10-CM | POA: Diagnosis not present

## 2017-02-02 DIAGNOSIS — E441 Mild protein-calorie malnutrition: Secondary | ICD-10-CM | POA: Diagnosis not present

## 2017-02-02 DIAGNOSIS — S066X9D Traumatic subarachnoid hemorrhage with loss of consciousness of unspecified duration, subsequent encounter: Secondary | ICD-10-CM | POA: Diagnosis not present

## 2017-02-02 DIAGNOSIS — C3410 Malignant neoplasm of upper lobe, unspecified bronchus or lung: Secondary | ICD-10-CM | POA: Diagnosis not present

## 2017-02-02 DIAGNOSIS — C4330 Malignant melanoma of unspecified part of face: Secondary | ICD-10-CM | POA: Diagnosis not present

## 2017-02-02 DIAGNOSIS — I5020 Unspecified systolic (congestive) heart failure: Secondary | ICD-10-CM | POA: Diagnosis not present

## 2017-02-02 DIAGNOSIS — L89321 Pressure ulcer of left buttock, stage 1: Secondary | ICD-10-CM | POA: Diagnosis not present

## 2017-02-02 DIAGNOSIS — Z6833 Body mass index (BMI) 33.0-33.9, adult: Secondary | ICD-10-CM | POA: Diagnosis not present

## 2017-02-02 DIAGNOSIS — I10 Essential (primary) hypertension: Secondary | ICD-10-CM | POA: Diagnosis not present

## 2017-02-02 DIAGNOSIS — E119 Type 2 diabetes mellitus without complications: Secondary | ICD-10-CM | POA: Diagnosis not present

## 2017-02-02 DIAGNOSIS — C3491 Malignant neoplasm of unspecified part of right bronchus or lung: Secondary | ICD-10-CM | POA: Diagnosis not present

## 2017-02-02 DIAGNOSIS — L89312 Pressure ulcer of right buttock, stage 2: Secondary | ICD-10-CM | POA: Diagnosis not present

## 2017-02-02 DIAGNOSIS — C7931 Secondary malignant neoplasm of brain: Secondary | ICD-10-CM | POA: Diagnosis not present

## 2017-02-02 DIAGNOSIS — I712 Thoracic aortic aneurysm, without rupture: Secondary | ICD-10-CM | POA: Diagnosis not present

## 2017-02-02 DIAGNOSIS — I4891 Unspecified atrial fibrillation: Secondary | ICD-10-CM | POA: Diagnosis not present

## 2017-02-03 DIAGNOSIS — L89312 Pressure ulcer of right buttock, stage 2: Secondary | ICD-10-CM | POA: Diagnosis not present

## 2017-02-03 DIAGNOSIS — L89321 Pressure ulcer of left buttock, stage 1: Secondary | ICD-10-CM | POA: Diagnosis not present

## 2017-02-03 DIAGNOSIS — S066X9D Traumatic subarachnoid hemorrhage with loss of consciousness of unspecified duration, subsequent encounter: Secondary | ICD-10-CM | POA: Diagnosis not present

## 2017-02-03 DIAGNOSIS — C7931 Secondary malignant neoplasm of brain: Secondary | ICD-10-CM | POA: Diagnosis not present

## 2017-02-03 DIAGNOSIS — E119 Type 2 diabetes mellitus without complications: Secondary | ICD-10-CM | POA: Diagnosis not present

## 2017-02-03 DIAGNOSIS — C3491 Malignant neoplasm of unspecified part of right bronchus or lung: Secondary | ICD-10-CM | POA: Diagnosis not present

## 2017-02-06 DIAGNOSIS — C7931 Secondary malignant neoplasm of brain: Secondary | ICD-10-CM | POA: Diagnosis not present

## 2017-02-06 DIAGNOSIS — C3491 Malignant neoplasm of unspecified part of right bronchus or lung: Secondary | ICD-10-CM | POA: Diagnosis not present

## 2017-02-06 DIAGNOSIS — S066X9D Traumatic subarachnoid hemorrhage with loss of consciousness of unspecified duration, subsequent encounter: Secondary | ICD-10-CM | POA: Diagnosis not present

## 2017-02-06 DIAGNOSIS — L89321 Pressure ulcer of left buttock, stage 1: Secondary | ICD-10-CM | POA: Diagnosis not present

## 2017-02-06 DIAGNOSIS — E119 Type 2 diabetes mellitus without complications: Secondary | ICD-10-CM | POA: Diagnosis not present

## 2017-02-06 DIAGNOSIS — L89312 Pressure ulcer of right buttock, stage 2: Secondary | ICD-10-CM | POA: Diagnosis not present

## 2017-02-07 DIAGNOSIS — C3491 Malignant neoplasm of unspecified part of right bronchus or lung: Secondary | ICD-10-CM | POA: Diagnosis not present

## 2017-02-07 DIAGNOSIS — S066X9D Traumatic subarachnoid hemorrhage with loss of consciousness of unspecified duration, subsequent encounter: Secondary | ICD-10-CM | POA: Diagnosis not present

## 2017-02-07 DIAGNOSIS — L89312 Pressure ulcer of right buttock, stage 2: Secondary | ICD-10-CM | POA: Diagnosis not present

## 2017-02-07 DIAGNOSIS — C7931 Secondary malignant neoplasm of brain: Secondary | ICD-10-CM | POA: Diagnosis not present

## 2017-02-07 DIAGNOSIS — E119 Type 2 diabetes mellitus without complications: Secondary | ICD-10-CM | POA: Diagnosis not present

## 2017-02-07 DIAGNOSIS — L89321 Pressure ulcer of left buttock, stage 1: Secondary | ICD-10-CM | POA: Diagnosis not present

## 2017-02-09 ENCOUNTER — Telehealth: Payer: Self-pay | Admitting: Medical Oncology

## 2017-02-09 DIAGNOSIS — L89321 Pressure ulcer of left buttock, stage 1: Secondary | ICD-10-CM | POA: Diagnosis not present

## 2017-02-09 DIAGNOSIS — C7931 Secondary malignant neoplasm of brain: Secondary | ICD-10-CM | POA: Diagnosis not present

## 2017-02-09 DIAGNOSIS — L89312 Pressure ulcer of right buttock, stage 2: Secondary | ICD-10-CM | POA: Diagnosis not present

## 2017-02-09 DIAGNOSIS — I5022 Chronic systolic (congestive) heart failure: Secondary | ICD-10-CM | POA: Diagnosis not present

## 2017-02-09 DIAGNOSIS — E119 Type 2 diabetes mellitus without complications: Secondary | ICD-10-CM | POA: Diagnosis not present

## 2017-02-09 DIAGNOSIS — C3491 Malignant neoplasm of unspecified part of right bronchus or lung: Secondary | ICD-10-CM | POA: Diagnosis not present

## 2017-02-09 DIAGNOSIS — S066X9D Traumatic subarachnoid hemorrhage with loss of consciousness of unspecified duration, subsequent encounter: Secondary | ICD-10-CM | POA: Diagnosis not present

## 2017-02-09 NOTE — Telephone Encounter (Signed)
Instructed [name] to draw labs today, She will fax labs from last week.

## 2017-02-13 DIAGNOSIS — C3491 Malignant neoplasm of unspecified part of right bronchus or lung: Secondary | ICD-10-CM | POA: Diagnosis not present

## 2017-02-13 DIAGNOSIS — E119 Type 2 diabetes mellitus without complications: Secondary | ICD-10-CM | POA: Diagnosis not present

## 2017-02-13 DIAGNOSIS — C7931 Secondary malignant neoplasm of brain: Secondary | ICD-10-CM | POA: Diagnosis not present

## 2017-02-13 DIAGNOSIS — S066X9D Traumatic subarachnoid hemorrhage with loss of consciousness of unspecified duration, subsequent encounter: Secondary | ICD-10-CM | POA: Diagnosis not present

## 2017-02-13 DIAGNOSIS — L89321 Pressure ulcer of left buttock, stage 1: Secondary | ICD-10-CM | POA: Diagnosis not present

## 2017-02-13 DIAGNOSIS — L89312 Pressure ulcer of right buttock, stage 2: Secondary | ICD-10-CM | POA: Diagnosis not present

## 2017-02-14 DIAGNOSIS — C7931 Secondary malignant neoplasm of brain: Secondary | ICD-10-CM | POA: Diagnosis not present

## 2017-02-14 DIAGNOSIS — L89312 Pressure ulcer of right buttock, stage 2: Secondary | ICD-10-CM | POA: Diagnosis not present

## 2017-02-14 DIAGNOSIS — E119 Type 2 diabetes mellitus without complications: Secondary | ICD-10-CM | POA: Diagnosis not present

## 2017-02-14 DIAGNOSIS — L89321 Pressure ulcer of left buttock, stage 1: Secondary | ICD-10-CM | POA: Diagnosis not present

## 2017-02-14 DIAGNOSIS — S066X9D Traumatic subarachnoid hemorrhage with loss of consciousness of unspecified duration, subsequent encounter: Secondary | ICD-10-CM | POA: Diagnosis not present

## 2017-02-14 DIAGNOSIS — C3491 Malignant neoplasm of unspecified part of right bronchus or lung: Secondary | ICD-10-CM | POA: Diagnosis not present

## 2017-02-15 DIAGNOSIS — L89312 Pressure ulcer of right buttock, stage 2: Secondary | ICD-10-CM | POA: Diagnosis not present

## 2017-02-15 DIAGNOSIS — C3491 Malignant neoplasm of unspecified part of right bronchus or lung: Secondary | ICD-10-CM | POA: Diagnosis not present

## 2017-02-15 DIAGNOSIS — I5022 Chronic systolic (congestive) heart failure: Secondary | ICD-10-CM | POA: Diagnosis not present

## 2017-02-15 DIAGNOSIS — C7931 Secondary malignant neoplasm of brain: Secondary | ICD-10-CM | POA: Diagnosis not present

## 2017-02-15 DIAGNOSIS — E119 Type 2 diabetes mellitus without complications: Secondary | ICD-10-CM | POA: Diagnosis not present

## 2017-02-15 DIAGNOSIS — S066X9D Traumatic subarachnoid hemorrhage with loss of consciousness of unspecified duration, subsequent encounter: Secondary | ICD-10-CM | POA: Diagnosis not present

## 2017-02-15 DIAGNOSIS — L89321 Pressure ulcer of left buttock, stage 1: Secondary | ICD-10-CM | POA: Diagnosis not present

## 2017-02-16 DIAGNOSIS — E119 Type 2 diabetes mellitus without complications: Secondary | ICD-10-CM | POA: Diagnosis not present

## 2017-02-16 DIAGNOSIS — L89321 Pressure ulcer of left buttock, stage 1: Secondary | ICD-10-CM | POA: Diagnosis not present

## 2017-02-16 DIAGNOSIS — C3491 Malignant neoplasm of unspecified part of right bronchus or lung: Secondary | ICD-10-CM | POA: Diagnosis not present

## 2017-02-16 DIAGNOSIS — L89312 Pressure ulcer of right buttock, stage 2: Secondary | ICD-10-CM | POA: Diagnosis not present

## 2017-02-16 DIAGNOSIS — S066X9D Traumatic subarachnoid hemorrhage with loss of consciousness of unspecified duration, subsequent encounter: Secondary | ICD-10-CM | POA: Diagnosis not present

## 2017-02-16 DIAGNOSIS — C7931 Secondary malignant neoplasm of brain: Secondary | ICD-10-CM | POA: Diagnosis not present

## 2017-02-20 ENCOUNTER — Ambulatory Visit
Admission: RE | Admit: 2017-02-20 | Discharge: 2017-02-20 | Disposition: A | Discharge status: Home / Self Care | Payer: Medicare Other | Source: Ambulatory Visit | Attending: Radiation Oncology | Admitting: Radiation Oncology

## 2017-02-20 DIAGNOSIS — E119 Type 2 diabetes mellitus without complications: Secondary | ICD-10-CM | POA: Diagnosis not present

## 2017-02-20 DIAGNOSIS — C3491 Malignant neoplasm of unspecified part of right bronchus or lung: Secondary | ICD-10-CM | POA: Diagnosis not present

## 2017-02-20 DIAGNOSIS — C3490 Malignant neoplasm of unspecified part of unspecified bronchus or lung: Secondary | ICD-10-CM | POA: Diagnosis not present

## 2017-02-20 DIAGNOSIS — L89312 Pressure ulcer of right buttock, stage 2: Secondary | ICD-10-CM | POA: Diagnosis not present

## 2017-02-20 DIAGNOSIS — S066X9D Traumatic subarachnoid hemorrhage with loss of consciousness of unspecified duration, subsequent encounter: Secondary | ICD-10-CM | POA: Diagnosis not present

## 2017-02-20 DIAGNOSIS — L89321 Pressure ulcer of left buttock, stage 1: Secondary | ICD-10-CM | POA: Diagnosis not present

## 2017-02-20 DIAGNOSIS — C7931 Secondary malignant neoplasm of brain: Secondary | ICD-10-CM

## 2017-02-20 DIAGNOSIS — C7949 Secondary malignant neoplasm of other parts of nervous system: Principal | ICD-10-CM

## 2017-02-20 MED ORDER — GADOBENATE DIMEGLUMINE 529 MG/ML IV SOLN
19.0000 mL | Freq: Once | INTRAVENOUS | Status: AC | PRN
  Administered 2017-02-20: 19 mL via INTRAVENOUS

## 2017-02-21 DIAGNOSIS — C7931 Secondary malignant neoplasm of brain: Secondary | ICD-10-CM | POA: Diagnosis not present

## 2017-02-21 DIAGNOSIS — C3491 Malignant neoplasm of unspecified part of right bronchus or lung: Secondary | ICD-10-CM | POA: Diagnosis not present

## 2017-02-21 DIAGNOSIS — L89312 Pressure ulcer of right buttock, stage 2: Secondary | ICD-10-CM | POA: Diagnosis not present

## 2017-02-21 DIAGNOSIS — S066X9D Traumatic subarachnoid hemorrhage with loss of consciousness of unspecified duration, subsequent encounter: Secondary | ICD-10-CM | POA: Diagnosis not present

## 2017-02-21 DIAGNOSIS — L89321 Pressure ulcer of left buttock, stage 1: Secondary | ICD-10-CM | POA: Diagnosis not present

## 2017-02-21 DIAGNOSIS — E119 Type 2 diabetes mellitus without complications: Secondary | ICD-10-CM | POA: Diagnosis not present

## 2017-02-22 ENCOUNTER — Ambulatory Visit: Payer: Medicare Other

## 2017-02-22 ENCOUNTER — Encounter: Payer: Self-pay | Admitting: Internal Medicine

## 2017-02-22 ENCOUNTER — Ambulatory Visit (HOSPITAL_BASED_OUTPATIENT_CLINIC_OR_DEPARTMENT_OTHER): Payer: Medicare Other | Admitting: Internal Medicine

## 2017-02-22 ENCOUNTER — Other Ambulatory Visit (HOSPITAL_BASED_OUTPATIENT_CLINIC_OR_DEPARTMENT_OTHER): Payer: Medicare Other

## 2017-02-22 VITALS — BP 104/54 | HR 89 | Temp 98.0°F | Resp 18 | Ht 69.0 in | Wt 206.3 lb

## 2017-02-22 DIAGNOSIS — R5383 Other fatigue: Secondary | ICD-10-CM | POA: Diagnosis not present

## 2017-02-22 DIAGNOSIS — C7931 Secondary malignant neoplasm of brain: Secondary | ICD-10-CM

## 2017-02-22 DIAGNOSIS — C3411 Malignant neoplasm of upper lobe, right bronchus or lung: Secondary | ICD-10-CM

## 2017-02-22 DIAGNOSIS — R531 Weakness: Secondary | ICD-10-CM

## 2017-02-22 LAB — CBC WITH DIFFERENTIAL/PLATELET
BASO%: 0.3 % (ref 0.0–2.0)
BASOS ABS: 0 10*3/uL (ref 0.0–0.1)
EOS ABS: 0.1 10*3/uL (ref 0.0–0.5)
EOS%: 1.2 % (ref 0.0–7.0)
HEMATOCRIT: 33.4 % — AB (ref 38.4–49.9)
HEMOGLOBIN: 10.9 g/dL — AB (ref 13.0–17.1)
LYMPH#: 1.4 10*3/uL (ref 0.9–3.3)
LYMPH%: 21.4 % (ref 14.0–49.0)
MCH: 27.9 pg (ref 27.2–33.4)
MCHC: 32.6 g/dL (ref 32.0–36.0)
MCV: 85.4 fL (ref 79.3–98.0)
MONO#: 0.7 10*3/uL (ref 0.1–0.9)
MONO%: 10.6 % (ref 0.0–14.0)
NEUT#: 4.4 10*3/uL (ref 1.5–6.5)
NEUT%: 66.5 % (ref 39.0–75.0)
Platelets: 233 10*3/uL (ref 140–400)
RBC: 3.91 10*6/uL — ABNORMAL LOW (ref 4.20–5.82)
RDW: 21.6 % — AB (ref 11.0–14.6)
WBC: 6.5 10*3/uL (ref 4.0–10.3)

## 2017-02-22 LAB — COMPREHENSIVE METABOLIC PANEL
ALBUMIN: 3 g/dL — AB (ref 3.5–5.0)
ALK PHOS: 95 U/L (ref 40–150)
ALT: 13 U/L (ref 0–55)
AST: 12 U/L (ref 5–34)
Anion Gap: 10 mEq/L (ref 3–11)
BUN: 15.7 mg/dL (ref 7.0–26.0)
CALCIUM: 10.7 mg/dL — AB (ref 8.4–10.4)
CO2: 23 mEq/L (ref 22–29)
Chloride: 99 mEq/L (ref 98–109)
Creatinine: 0.8 mg/dL (ref 0.7–1.3)
EGFR: 82 mL/min/{1.73_m2} — AB (ref >90)
Glucose: 204 mg/dl — ABNORMAL HIGH (ref 70–140)
Potassium: 4.2 mEq/L (ref 3.5–5.1)
Sodium: 131 mEq/L — ABNORMAL LOW (ref 136–145)
Total Bilirubin: 0.5 mg/dL (ref 0.20–1.20)
Total Protein: 6.9 g/dL (ref 6.4–8.3)

## 2017-02-22 LAB — UA PROTEIN, DIPSTICK - CHCC

## 2017-02-22 NOTE — Progress Notes (Signed)
[facility] [phone]   Fax:([phone]) [phone]  OFFICE PROGRESS NOTE  Purvis Kilts, MD [address] [place] [address]  DIAGNOSIS: Stage IV (T3, N2, M1b) poorly differentiated adenocarcinoma diagnosed in November 2017. He was initially diagnosed with a stage IA non-small cell lung cancer, moderately differentiated adenocarcinoma in April 2008. PDL 1 expression 0%.  MOLECULAR STUDIES: Genomic Alterations Identified? BRCA2 R2494* AKT2 amplification - equivocal? TP53 A3f*35 Additional Findings? Microsatellite status MS-Stable Tumor Mutation Burden TMB-Intermediate; 12 Muts/Mb Additional Disease-relevant Genes with No Reportable Alterations Identified? EGFR KRAS ALK BRAF MET RET ERBB2 ROS1  PRIOR THERAPY: 1) status post wedge resection of the right upper lobe with seed implants under the care of Dr. BArlyce Diceon 10/13/2007. 2) palliative radiotherapy to the large right upper lobe lung mass under the care of Dr. MTammi Klippel 3) stereotactic radiotherapy to a solitary left thalamic brain metastasis on 11/17/2016. 4) Systemic chemotherapy with carboplatin for AUC of 5, Alimta 500 MG/M2 and Avastin 15 MG/KG every 3 weeks. First dose 08/30/2016. Status post 2 cycles. Starting from cycle #2 carboplatin will be for AUC of 4 and Alimta 400 MG/M2.  CURRENT THERAPY: None.  INTERVAL HISTORY: Andrew [id number].o. male returns to the clinic today for follow-up visit accompanied by his niece. The patient has rough time with the previous chemotherapy. His last dose of treatment was on 01/11/2017 discontinued secondary to intolerance. He complains of increasing fatigue and weakness. He has no current nausea or vomiting. He has shortness of breath with exertion but no significant chest pain or hemoptysis. He has no significant weight loss or night sweats. He has no fever or chills. He is here today for evaluation and discussion of his treatment  options.  MEDICAL HISTORY: Past Medical History:  Diagnosis Date  . Adenocarcinoma of right lung, stage 4 ([facility] 10/13/2016  . Atrial fibrillation ([facility]   . Brain metastasis ([facility] 11/03/2016  . Colon polyps   . Encounter for antineoplastic chemotherapy 12/07/2016  . Enlarged prostate   . Essential hypertension   . Goals of care, counseling/discussion 11/17/2016  . History of kidney stones   . Hypercholesteremia   . Lung cancer ([facility] Dx'd 01/2007   Stage IA non-small cell lung cancer, moderately differentiated adenocarcinoma - VATS and seed implants  . Thoracic aortic aneurysm (H[facility])    4.2 cm in AP diameter   . Type 2 diabetes mellitus (H[facility])     ALLERGIES:  is allergic to sulfa antibiotics.  MEDICATIONS:  Current Outpatient Prescriptions  Medication Sig Dispense Refill  . albuterol (PROVENTIL HFA;VENTOLIN HFA) 108 (90 Base) MCG/ACT inhaler Inhale 2 puffs into the lungs every 6 (six) hours as needed for wheezing or shortness of breath. 1 Inhaler 2  . alfuzosin (UROXATRAL) 10 MG 24 hr tablet Take 10 mg by mouth daily.      .Marland Kitchenatorvastatin (LIPITOR) 20 MG tablet Take 1 tablet (20 mg total) by mouth at bedtime. 30 tablet 0  . dexamethasone (DECADRON) 4 MG tablet 4 mg by mouth twice a day the day before, day of and day after the chemotherapy every 3 weeks 40 tablet 1  . docusate sodium (COLACE) 100 MG capsule Take 100 mg by mouth 2 (two) times daily.    .Marland KitchenELIQUIS 5 MG TABS tablet Take 5 mg by mouth 2 (two) times daily.     . furosemide (LASIX) 40 MG tablet Take 1 tablet (40 mg total) by mouth daily as needed. For weight gain.  90 tablet 3  . guaiFENesin-dextromethorphan (ROBITUSSIN DM) 100-10 MG/5ML syrup Take 5 mLs by mouth every 4 (four) hours as needed for cough. 118 mL 0  . JANUMET 50-1000 MG tablet 1 tablet 2 (two) times daily.    Marland Kitchen LORazepam (ATIVAN) 1 MG tablet Take 0.5 tablets (0.5 mg total) by mouth every 8 (eight) hours as needed for anxiety (30 min before MRI). 15 tablet 0  .  losartan (COZAAR) 100 MG tablet Take 100 mg by mouth daily.    . metoprolol (LOPRESSOR) 50 MG tablet Take 1 tablet (50 mg total) by mouth 2 (two) times daily. 60 tablet 0  . oxybutynin (DITROPAN-XL) 10 MG 24 hr tablet Take 10 mg by mouth at bedtime.    . simvastatin (ZOCOR) 40 MG tablet Take 40 mg by mouth every evening.     . Wound Cleansers (RADIAPLEX EX) Apply 1 application topically daily as needed (for affected area).     . AMBULATORY NON FORMULARY MEDICATION Medication Name: MAGIC MOUTHWASH 2 % Viscous Lidocaine  Maalox Benadryl Susp.  Disp: 1:1:1 223m bottle Sig: 138mPO Swish & Spit  q 3-4hrs. PRN mouth sores (Patient not taking: Reported on 02/03/82/382520053L 0  . folic acid (FOLVITE) 1 MG tablet Take 1 tablet (1 mg total) by mouth daily. (Patient not taking: Reported on 02/22/2017) 30 tablet 4  . potassium chloride (K-DUR) 10 MEQ tablet Take 1 tablet (10 mEq total) by mouth daily. 90 tablet 3  . prochlorperazine (COMPAZINE) 10 MG tablet Take 1 tablet (10 mg total) by mouth every 6 (six) hours as needed for nausea or vomiting. (Patient not taking: Reported on 02/22/2017) 30 tablet 0   No current facility-administered medications for this visit.     SURGICAL HISTORY:  Past Surgical History:  Procedure Laterality Date  . APPENDECTOMY    . CERVICAL SPINE SURGERY    . CHOLECYSTECTOMY    . COLONOSCOPY  04/17/2012   Procedure: COLONOSCOPY;  Surgeon: MaJamesetta SoMD;  Location: [facility];  Service: Gastroenterology;  Laterality: N/A;  . COLONOSCOPY W/ POLYPECTOMY    . TONSILLECTOMY    . wedge resection with seed implantation and node sampling with right VATS  03/13/2007    REVIEW OF SYSTEMS:  A comprehensive review of systems was negative except for: Constitutional: positive for fatigue Respiratory: positive for cough and dyspnea on exertion Musculoskeletal: positive for muscle weakness   PHYSICAL EXAMINATION: General appearance: alert, cooperative, fatigued and no  distress Head: Normocephalic, without obvious abnormality, atraumatic Neck: no adenopathy, no JVD, supple, symmetrical, trachea midline and thyroid not enlarged, symmetric, no tenderness/mass/nodules Lymph nodes: Cervical, supraclavicular, and axillary nodes normal. Resp: dullness to percussion RLL, rubs bilaterally and wheezes bilaterally Back: symmetric, no curvature. ROM normal. No CVA tenderness. Cardio: regular rate and rhythm, S1, S2 normal, no murmur, click, rub or gallop GI: soft, non-tender; bowel sounds normal; no masses,  no organomegaly Extremities: extremities normal, atraumatic, no cyanosis or edema  ECOG PERFORMANCE STATUS: 1 - Symptomatic but completely ambulatory  Blood pressure (!) 104/54, pulse 89, temperature 98 F (36.7 C), temperature source Oral, resp. rate 18, height 5' 9"  (1.753 m), weight 206 lb 4.8 oz (93.6 kg), SpO2 98 %.  LABORATORY DATA: Lab Results  Component Value Date   WBC 6.5 02/22/2017   HGB 10.9 (L) 02/22/2017   HCT 33.4 (L) 02/22/2017   MCV 85.4 02/22/2017   PLT 233 02/22/2017      Chemistry      Component Value  Date/Time   NA 131 (L) 02/22/2017 1038   K 4.2 02/22/2017 1038   CL 90 (L) 01/23/2017 1600   CL 104 10/30/2012 0805   CO2 23 02/22/2017 1038   BUN 15.7 02/22/2017 1038   CREATININE 0.8 02/22/2017 1038      Component Value Date/Time   CALCIUM 10.7 (H) 02/22/2017 1038   ALKPHOS 95 02/22/2017 1038   AST 12 02/22/2017 1038   ALT 13 02/22/2017 1038   BILITOT 0.50 02/22/2017 1038       RADIOGRAPHIC STUDIES: Dg Chest 2 View  Result Date: 01/23/2017 CLINICAL DATA:  Cough and fever. EXAM: CHEST  2 VIEW COMPARISON:  01/18/2017 . FINDINGS: Mediastinum and hilar structures normal. Cardiomegaly with normal pulmonary vascularity. Low lung volumes with bibasilar pleural-parenchymal thickening again noted consistent with scarring. Developing infiltrate right lower lobe cannot be excluded P Seed implants again noted over the right mid  chest . IMPRESSION: Bibasilar pleural-parenchymal thickening again noted consistent scarring. Developing infiltrate right lower lobe cannot be excluded. Electronically Signed   By: Marcello Moores  Register   On: 01/23/2017 17:05   Mr Brain W Wo Contrast  Result Date: 02/20/2017 CLINICAL DATA:  Secondary malignant neoplasm of brain. Lung cancer and skin cancer. Followup metastatic disease to brain treated with radiation and chemotherapy. EXAM: MRI HEAD WITHOUT AND WITH CONTRAST TECHNIQUE: Multiplanar, multiecho pulse sequences of the brain and surrounding structures were obtained without and with intravenous contrast. CONTRAST:  95m MULTIHANCE GADOBENATE DIMEGLUMINE 529 MG/ML IV SOLN COMPARISON:  MRI head 11/14/2016 FINDINGS: Brain: Interval improvement in ring-enhancing lesion left posterior thalamus. This area now shows decrease in size with linear enhancement of the lesion. This is most compatible with treated metastatic disease. No new areas of metastatic disease are identified. Moderate atrophy. Mild chronic microvascular ischemic change in the white matter. Negative for acute infarct. Negative for hemorrhage. No shift of the midline structures. Vascular: Normal arterial flow void. Skull and upper cervical spine: Negative Sinuses/Orbits: Multiple air-fluid levels throughout the paranasal sinuses including both maxillary sinuses and the sphenoid sinus. No orbital mass lesion. Other: None IMPRESSION: Interval improvement in metastatic disease to the left thalamus. No new lesions identified Generalized atrophy with chronic microvascular ischemia Sinusitis with multiple air-fluid levels. Electronically Signed   By: CFranchot GalloM.D.   On: 02/20/2017 13:49    ASSESSMENT AND PLAN:  This is a very pleasant 83years old white male with metastatic non-small cell lung cancer, adenocarcinoma with solitary brain metastasis status post stereotactic radiotherapy to the solitary brain lesion and the recent MRI of the brain  showed improvement in this area. The patient was started on systemic chemotherapy with carboplatin and Alimta but unfortunately he has rough time with this treatment with frequent hospitalization as well as pneumonia and influenza. He is not interested in any further treatment at this point. I had a lengthy discussion with the patient and his niece about his current condition and treatment options. I also discussed with him the goals of care and strongly recommended for the patient to consider palliative care and hospice at this point. We will not repeat imaging studies until you see patient is symptomatic. He will request Dr. GHilma Favorsto the attending for his hospice service as he knows him for years, but I will be happy to direct if his primary care physician declined. I will see him on as-needed basis at this point. He was advised to call if he has any concerning symptoms in the interval. The patient voices understanding of  current disease status and treatment options and is in agreement with the current care plan.  All questions were answered. The patient knows to call the clinic with any problems, questions or concerns. We can certainly see the patient much sooner if necessary. I spent 10 minutes counseling the patient face to face. The total time spent in the appointment was 15 minutes.   Disclaimer: This note was dictated with voice recognition software. Similar sounding words can inadvertently be transcribed and may not be corrected upon review.

## 2017-02-23 ENCOUNTER — Telehealth: Payer: Self-pay | Admitting: Radiation Therapy

## 2017-02-23 NOTE — Telephone Encounter (Addendum)
Mr. [name] was scheduled to see Dr. Sherwood Gambler on 3/28 to review the results of his recent brain MRI. He was not able to make it to that appointment due to transportation. Rather than reschedule him, I shared the good results of his scan and the recommendations from the physician team with him over the phone. I also offered him an appointment with our genetics team for a genetic testing consult. He is not interested in that at this time.   Clip from the 3/26 MRI report:  IMPRESSION: Interval improvement in metastatic disease to the left thalamus. No new lesions identified  The recommendations from our 3/28 Brain and Spine Conference is to re-scan in 3 months and follow-up with Dr. Tammi Klippel at that time.     [name] R.T.(R)(T) Special Procedures Navigator

## 2017-03-02 ENCOUNTER — Telehealth: Payer: Self-pay | Admitting: Genetic Counselor

## 2017-03-02 ENCOUNTER — Encounter: Payer: Self-pay | Admitting: Genetic Counselor

## 2017-03-02 NOTE — Telephone Encounter (Signed)
I spoke with patient about a need for genetic testing.  Patient indicated that [name] would be more convenient, and asked that we contact his niece [name].  I called [name] and spoke with her about genetic testing.  Discussed that a BRCA mutation was identified in his tumor.  Literature suggests that there is a high correlation with BRCA tumor mutations and germline mutations.  He has a family history of breast cancer in his sister and mother.  This strenghens our concern that maybe this BRCA mutation could be real.  [name] approved for [place] to schedule an appointment at [facility] for May 10, and she would contact his daughter and discuss this as well.  I contacted [name] at [facility] and asked her to set up a 9 AM appointment for May 10.

## 2017-03-15 ENCOUNTER — Ambulatory Visit: Payer: Medicare Other | Admitting: Internal Medicine

## 2017-03-15 ENCOUNTER — Ambulatory Visit: Payer: Medicare Other

## 2017-03-15 ENCOUNTER — Other Ambulatory Visit: Payer: Medicare Other

## 2017-03-16 DIAGNOSIS — I10 Essential (primary) hypertension: Secondary | ICD-10-CM | POA: Diagnosis not present

## 2017-03-16 DIAGNOSIS — E119 Type 2 diabetes mellitus without complications: Secondary | ICD-10-CM | POA: Diagnosis not present

## 2017-03-16 DIAGNOSIS — I4891 Unspecified atrial fibrillation: Secondary | ICD-10-CM | POA: Diagnosis not present

## 2017-03-16 DIAGNOSIS — E6609 Other obesity due to excess calories: Secondary | ICD-10-CM | POA: Diagnosis not present

## 2017-03-16 DIAGNOSIS — Z6831 Body mass index (BMI) 31.0-31.9, adult: Secondary | ICD-10-CM | POA: Diagnosis not present

## 2017-03-16 DIAGNOSIS — E782 Mixed hyperlipidemia: Secondary | ICD-10-CM | POA: Diagnosis not present

## 2017-03-20 IMAGING — CT NM PET TUM IMG RESTAG (PS) SKULL BASE T - THIGH
8 series · 25 of 25 positions shown · non-contrast
Comparison: 08/25/2016 chest CT.  PET of 11/16/2006.

CLINICAL DATA: Subsequent treatment strategy for restaging of lung
cancer. Status post radiation seed implants..

EXAM:
NUCLEAR MEDICINE PET SKULL BASE TO THIGH
TECHNIQUE: 11.9 mCi F-18 FDG was injected intravenously. Full-ring PET imaging
was performed from the skull base to thigh after the radiotracer. CT
data was obtained and used for attenuation correction and anatomic
localization.
FASTING BLOOD GLUCOSE:  Value: 122 mg/dl

[Series 3: pet sk_thigh ac · axial · 5.0mm · 4.07mm/px · z∈[-1514,-570]mm · 4 of 237 slices shown]
[im 1/237]
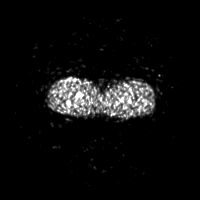
[im 79/237]
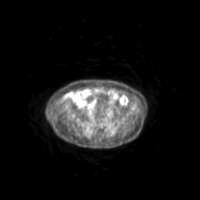
[im 158/237]
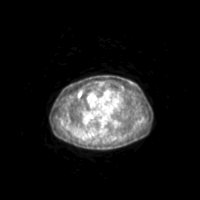
[im 237/237]
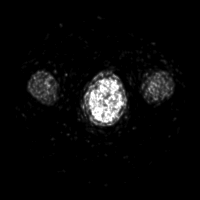

[Series 4: ct sk_thigh 5.0 hd_fov · axial · 5.0mm · 1.07mm/px · z∈[-1514,-570]mm · 5 of 237 slices shown]
[im 1/237]
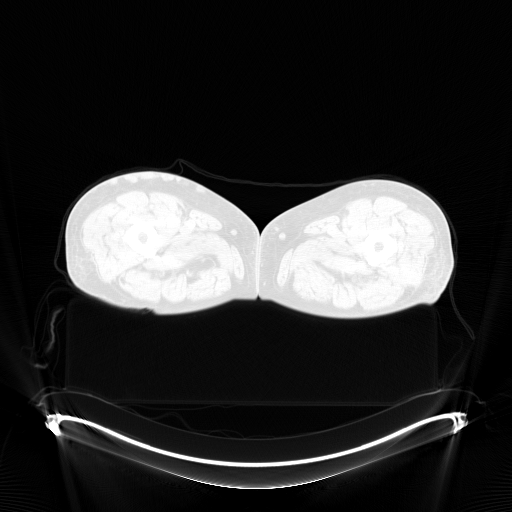
[im 60/237]
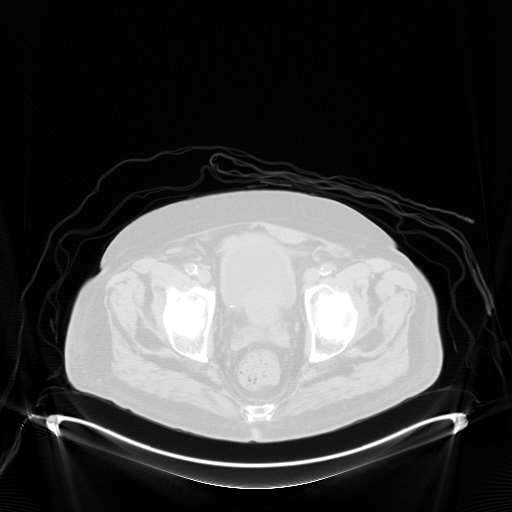
[im 119/237]
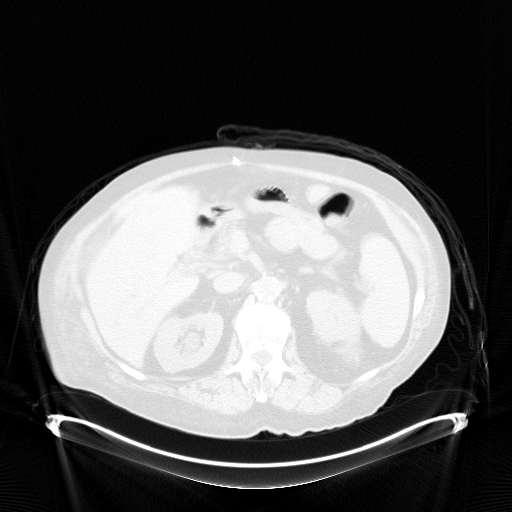
[im 178/237]
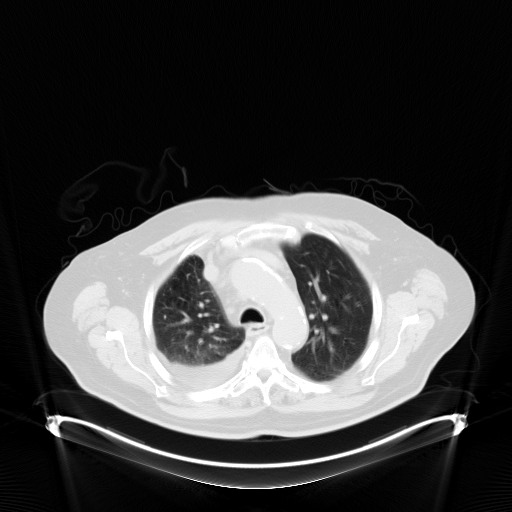
[im 237/237  brain]
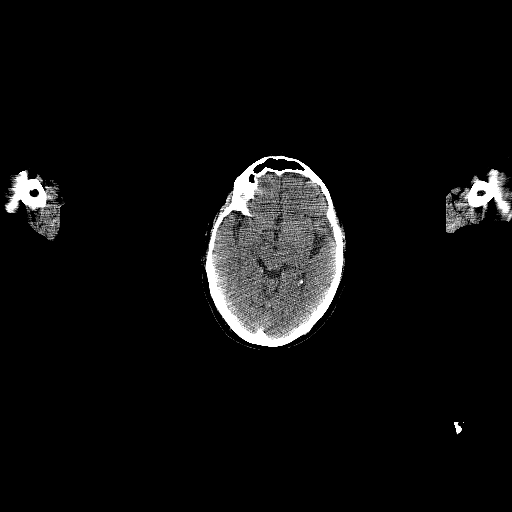

[Series 7: ct sk_thigh 5.0 b70f (id)_bone · axial · 5.0mm · 0.76mm/px · z∈[-990,-714]mm · 2 of 70 slices shown]
[im 1/70  bone]
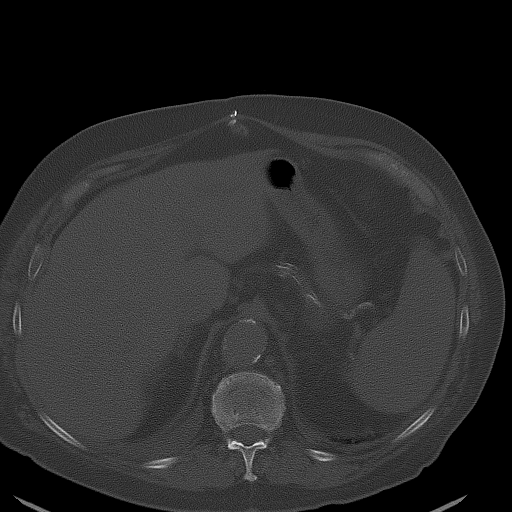
[im 70/70  bone]
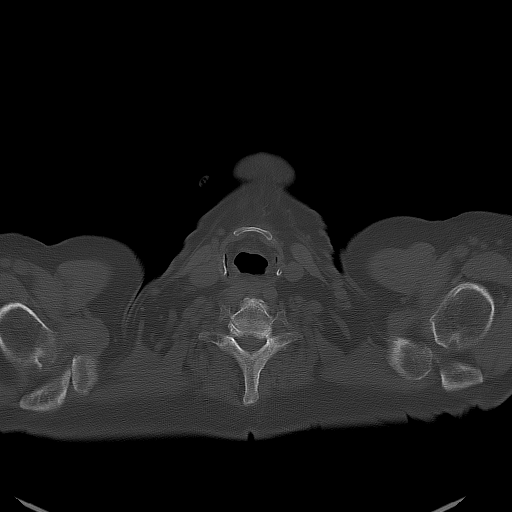

[Series 8: pet sk_thigh nac · axial · 5.0mm · 4.07mm/px · z∈[-1514,-570]mm · 5 of 237 slices shown]
[im 1/237]
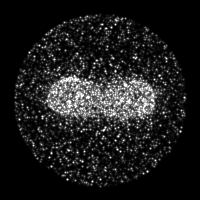
[im 60/237]
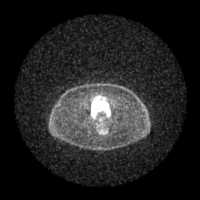
[im 119/237]
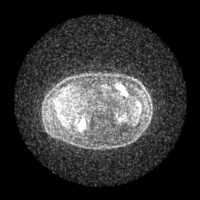
[im 178/237]
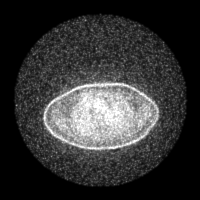
[im 237/237]
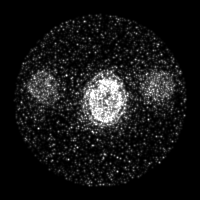

[Series 604: range-ct sk_thigh 5.0 hd_fov-cor-<alpha range> · 2 of 67 slices shown]
[im 1/67]
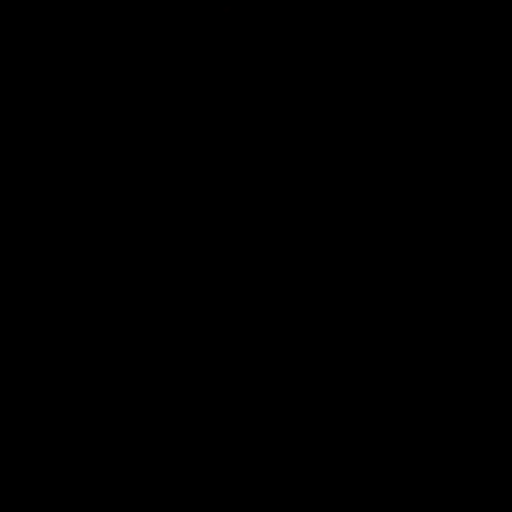
[im 67/67]
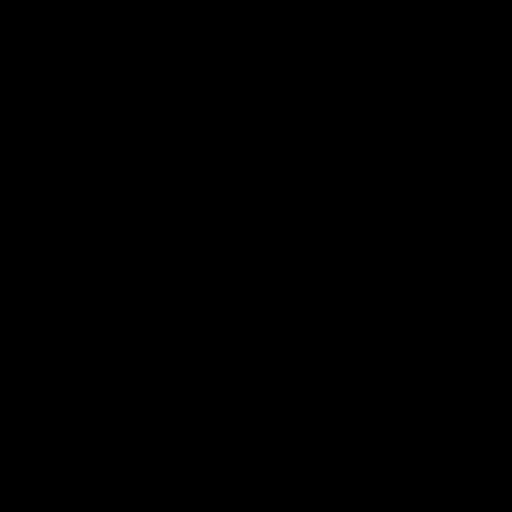

[Series 605: mip collection · coronal · 1.96mm/px · 1 of 32 slices shown]
[im 1/32]
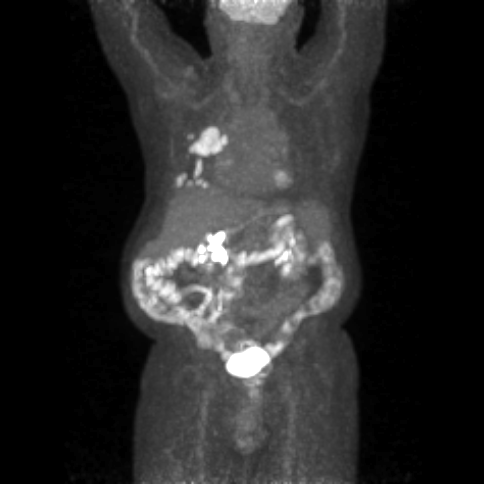

[Series 606: range-ct sk_thigh 5.0 hd_fov-tra-<alpha range> · 5 of 226 slices shown]
[im 1/226]
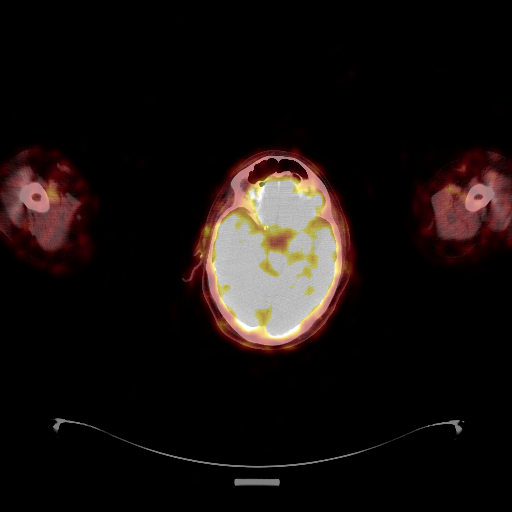
[im 57/226]
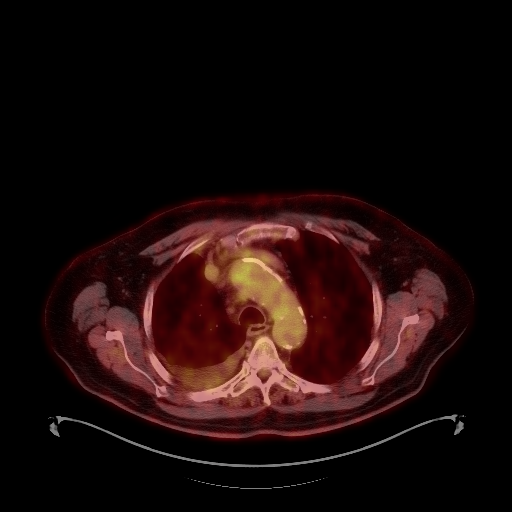
[im 113/226]
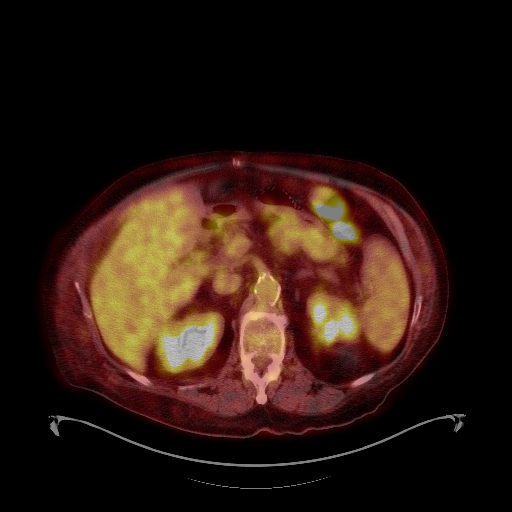
[im 169/226]
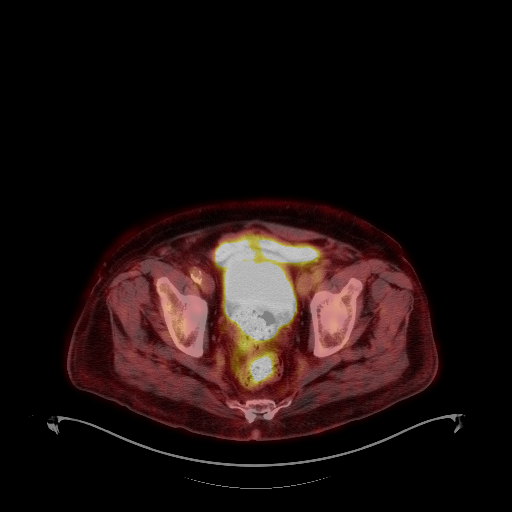
[im 226/226]
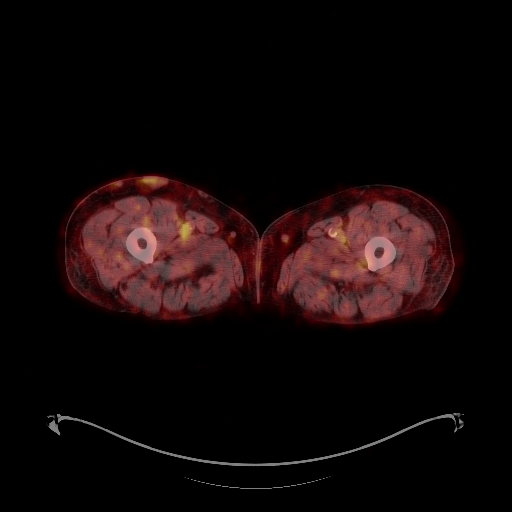

[Series 1032: results mm oncology reading · 4.0mm · 0.95mm/px · 1 of 4 slices shown]
[im 1/4]
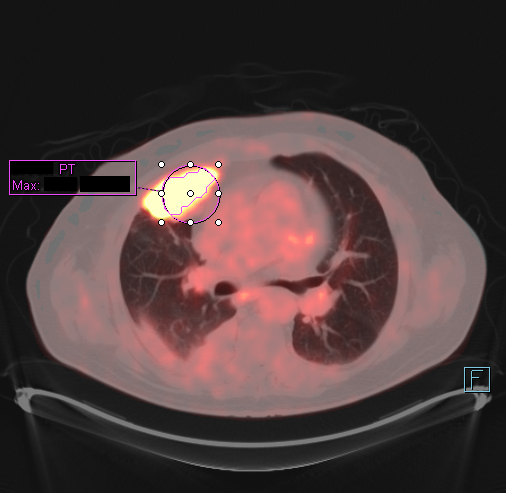

[25 of 25 positions shown; findings below may reference images not displayed]

FINDINGS: NECK

No areas of abnormal hypermetabolism.

CHEST

Hypermetabolism corresponding to the pleural-based anterior right
upper lobe along the mass. This measures on the order of 8.3 x
cm and a S.U.V. max of 17.9 on image 72/ series 4.

Multiple right-sided hypermetabolic pleural implants. Example
measuring 1.3 cm and a S.U.V. max of 11.1 on image 89/series 4.

Low right mediastinal and infrahilar hypermetabolic nodes, including
at a S.U.V. max of 5.7 on image 82/ series 4.

ABDOMEN/PELVIS

No adrenal hypermetabolism. Colonic hypermetabolism is diffuse and
likely physiologic.

SKELETON

No abnormal marrow activity.

CT IMAGES PERFORMED FOR ATTENUATION CORRECTION

No cervical adenopathy. Carotid atherosclerosis bilaterally. Chest
findings deferred to recent diagnostic CT. Right-sided pleural
effusion is minimally increased. Radiation seeds in the anterior
right upper lobe. Cardiomegaly. Increase in moderate pericardial
effusion. Multivessel coronary artery atherosclerosis. Pulmonary
artery enlargement, 4.2 cm outflow tract. Bilateral large volume
renal collecting system calculi. Bilateral low-density renal lesions
are likely cysts. Advanced abdominal aortic and branch vessel
atherosclerosis. Moderate prostatomegaly. Right-sided bladder
calculi. Minimal chronic right-sided caliectasis is similar and
likely relates to a mild ureteropelvic junction obstruction. Large
colonic stool burden, especially distally.
IMPRESSION: 1. Pleural-based right upper lobe lung hypermetabolic mass which is
favored to represent a metachronous primary bronchogenic carcinoma.
2. Right pleural and thoracic nodal metastasis.
3. No hypermetabolic extrathoracic metastasis identified.
4. Incidental findings, including urinary tract calculi, chronic
right ureteropelvic junction obstruction, and atherosclerosis.
5. Enlargement of a moderate pericardial and small right pleural
effusion.
6. Pulmonary artery enlargement suggests pulmonary arterial
hypertension.

## 2017-04-05 ENCOUNTER — Ambulatory Visit: Payer: Medicare Other

## 2017-04-05 ENCOUNTER — Other Ambulatory Visit: Payer: Medicare Other

## 2017-04-05 ENCOUNTER — Ambulatory Visit: Payer: Medicare Other | Admitting: Internal Medicine

## 2017-04-05 DIAGNOSIS — Z803 Family history of malignant neoplasm of breast: Secondary | ICD-10-CM | POA: Diagnosis not present

## 2017-04-05 DIAGNOSIS — Z86010 Personal history of colonic polyps: Secondary | ICD-10-CM | POA: Diagnosis not present

## 2017-04-05 DIAGNOSIS — C3411 Malignant neoplasm of upper lobe, right bronchus or lung: Secondary | ICD-10-CM | POA: Diagnosis not present

## 2017-04-06 ENCOUNTER — Encounter (HOSPITAL_COMMUNITY): Payer: Self-pay | Admitting: Genetic Counselor

## 2017-04-06 ENCOUNTER — Encounter (HOSPITAL_COMMUNITY): Payer: Medicare Other | Attending: Internal Medicine | Admitting: Genetic Counselor

## 2017-04-06 ENCOUNTER — Telehealth: Payer: Self-pay | Admitting: *Deleted

## 2017-04-06 DIAGNOSIS — Z803 Family history of malignant neoplasm of breast: Secondary | ICD-10-CM

## 2017-04-06 DIAGNOSIS — C3411 Malignant neoplasm of upper lobe, right bronchus or lung: Secondary | ICD-10-CM | POA: Insufficient documentation

## 2017-04-06 DIAGNOSIS — Z85118 Personal history of other malignant neoplasm of bronchus and lung: Secondary | ICD-10-CM

## 2017-04-06 NOTE — Telephone Encounter (Signed)
FYI "Debbie with Dr.Robert Seward Grater DDS calling about this mutual patient of Dr.Mohamed's.  Is he allowed to have dental procedures done?  Can we receive the last office note?"  Signed release of information needed for records.  Patient appointments cancelled.  Appointment notes read he no longer will be seeing Dr. Julien Nordmann.  Last Chemotherapy treatment was January 11, 2017.

## 2017-04-06 NOTE — Progress Notes (Signed)
REFERRING PROVIDER: Curt Bears, MD [address] [place], [place] [address]  PRIMARY PROVIDER:  Sharilyn Sites, MD  PRIMARY REASON FOR VISIT:  1. Hx of cancer of lung   2. Family history of breast cancer   3. Adenocarcinoma of right lung, stage 4 (HCC)      HISTORY OF PRESENT ILLNESS:   [name], a 83 y.o. male, was seen for a [facility] cancer genetics consultation at the request of Dr. Julien Nordmann due to a personal and family history of cancer.  [name] presents to clinic today to discuss the possibility of a hereditary predisposition to cancer, genetic testing, and to further clarify his future cancer risks, as well as potential cancer risks for family members.   In 2008, at the age of 59, [name] was diagnosed with lung cancer.  He was re-diagnosed in 2017.  At that time, [facility] testing was performed on his lung cancer tumor.  A BRCA2 [id number]* mutation was found on the test.     CANCER HISTORY:   No history exists.       Past Medical History:  Diagnosis Date  . Adenocarcinoma of right lung, stage 4 ([place]) 10/13/2016  . Atrial fibrillation ([place])   . Brain metastasis ([place]) 11/03/2016  . Colon polyps   . Encounter for antineoplastic chemotherapy 12/07/2016  . Enlarged prostate   . Essential hypertension   . Family history of breast cancer   . Goals of care, counseling/discussion 11/17/2016  . History of kidney stones   . Hypercholesteremia   . Lung cancer ([place]) Dx'd 01/2007   Stage IA non-small cell lung cancer, moderately differentiated adenocarcinoma - VATS and seed implants  . Thoracic aortic aneurysm (HCC)    4.2 cm in AP diameter   . Type 2 diabetes mellitus ([place])     Past Surgical History:  Procedure Laterality Date  . APPENDECTOMY    . CERVICAL SPINE SURGERY    . CHOLECYSTECTOMY    . COLONOSCOPY  04/17/2012   Procedure: COLONOSCOPY;  Surgeon: Jamesetta So, MD;  [facility];  Service: Gastroenterology;  Laterality: N/A;   . COLONOSCOPY W/ POLYPECTOMY    . TONSILLECTOMY    . wedge resection with seed implantation and node sampling with right VATS  03/13/2007    Social History   Social History  . Marital status: Married    Spouse name: N/A  . Number of children: N/A  . Years of education: N/A   Social History Main Topics  . Smoking status: Former Smoker    Packs/day: 1.50    Years: 15.00    Types: Cigarettes    Quit date: 11/28/1964  . Smokeless tobacco: Never Used  . Alcohol use No  . Drug use: No  . Sexual activity: No   Other Topics Concern  . None   Social History Narrative  . None     FAMILY HISTORY:  We obtained a detailed, 4-generation family history.  Significant diagnoses are listed below: Family History  Problem Relation Age of Onset  . Cancer Mother        Breast  . Cancer Sister        Breast  . Cancer Sister        possible breast cancer  . Colon cancer Neg Hx     The patient was not a good historian.  He has three children, one daughter and two sons.  He had four sisters, two who died of cancer, possibly breast cancer.  One is alive.  Three of the fours sisters had children.  The patient's mother had breast cancer when he was 71.  He does not know how old his mother was at her diagnosis.  The patient did not know of other cancer.  [name] is unaware of previous family history of genetic testing for hereditary cancer risks. Patient's maternal ancestors are of Caucasian descent, and paternal ancestors are of Caucasian descent. There is no reported Ashkenazi Jewish ancestry. There is no known consanguinity.  GENETIC COUNSELING ASSESSMENT: [name] is a 83 y.o. male with a personal history of lung cancer, a BRCA2 mutation found on tumor testing and family history of breast cancer which is somewhat suggestive of a hereditary cancer syndrome and predisposition to cancer. We, therefore, discussed and recommended the following at today's visit.   DISCUSSION: We  discussed that tumor testing can identify hereditary mutations for cancer.  Some mutations are less likely to be real, but other mutations are much more likely to be real.  Based on literature, BRCA mutations have about an 80% chance of being a germline mutation.  Based on the patient's tumor testing and his family history of breast cancer, we discussed genetic testing.  We reviewed the characteristics, features and inheritance patterns of hereditary cancer syndromes. We also discussed genetic testing, including the appropriate family members to test, the process of testing, insurance coverage and turn-around-time for results. We discussed the implications of a negative, positive and/or variant of uncertain significant result. We recommended [name] pursue genetic testing for the Common Hereditary cancer gene panel. The Hereditary Gene Panel offered by [facility] includes sequencing and/or deletion duplication testing of the following 46 genes: APC, ATM, AXIN2, BARD1, BMPR1A, BRCA1, BRCA2, BRIP1, CDH1, CDKN2A (p14ARF), CDKN2A (p16INK4a), CHEK2, CTNNA1, DICER1, EPCAM (Deletion/duplication testing only), GREM1 (promoter region deletion/duplication testing only), KIT, MEN1, MLH1, MSH2, MSH3, MSH6, MUTYH, NBN, NF1, NTHL1 PALB2, PDGFRA, PMS2, POLD1, POLE, PTEN, RAD50, RAD51C, RAD51D, SDHB, SDHC, SDHD, SMAD4, SMARCA4. STK11, TP53, TSC1, TSC2, and VHL.  The following gene was evaluated for sequence changes only: SDHA and HOXB13 c.251G>A variant only.  Based on [name] personal and family history of cancer, he meets medical criteria for genetic testing. Despite that he meets criteria, he may still have an out of pocket cost. We discussed that if his out of pocket cost for testing is over $100, the laboratory will call and confirm whether he wants to proceed with testing.  If the out of pocket cost of testing is less than $100 he will be billed by the genetic testing laboratory.   PLAN: After considering the  risks, benefits, and limitations, [name]  provided informed consent to pursue genetic testing and the blood sample was sent to [facility] for analysis of the Common Hereditary Cancer Panel. Results should be available within approximately 2-3 weeks' time, at which point they will be disclosed by telephone to [name], as will any additional recommendations warranted by these results. [name] will receive a summary of his genetic counseling visit and a copy of his results once available. The patient requested that we call his daughter, Ivory Broad at [phone] with the test results. This information will also be available in Epic. We encouraged [name] to remain in contact with cancer genetics annually so that we can continuously update the family history and inform him of any changes in cancer genetics and testing that may be of benefit for his family. [name] questions were answered  to his satisfaction today. Our contact information was provided should additional questions or concerns arise.  Lastly, we encouraged [name] to remain in contact with cancer genetics annually so that we can continuously update the family history and inform him of any changes in cancer genetics and testing that may be of benefit for this family.   Mr.  French questions were answered to his satisfaction today. Our contact information was provided should additional questions or concerns arise. Thank you for the referral and allowing [place] to share in the care of your patient.   Aeryn Medici P. Florene Glen, [place], [facility] Certified Genetic Counselor [email or web address] phone: [phone]  The patient was seen for a total of 30 minutes in face-to-face genetic counseling.  This patient was discussed with Drs. Magrinat, Lindi Adie and/or Burr Medico who agrees with the above.    _______________________________________________________________________ For Office Staff:  Number of people involved in  session: 1 Was an Intern/ student involved with case: no

## 2017-04-17 ENCOUNTER — Encounter: Payer: Self-pay | Admitting: Genetic Counselor

## 2017-04-17 ENCOUNTER — Ambulatory Visit: Payer: Self-pay | Admitting: Genetic Counselor

## 2017-04-17 ENCOUNTER — Telehealth: Payer: Self-pay | Admitting: Genetic Counselor

## 2017-04-17 DIAGNOSIS — Z1501 Genetic susceptibility to malignant neoplasm of breast: Secondary | ICD-10-CM | POA: Insufficient documentation

## 2017-04-17 DIAGNOSIS — Z1509 Genetic susceptibility to other malignant neoplasm: Secondary | ICD-10-CM

## 2017-04-17 DIAGNOSIS — C3411 Malignant neoplasm of upper lobe, right bronchus or lung: Secondary | ICD-10-CM

## 2017-04-17 DIAGNOSIS — Z1379 Encounter for other screening for genetic and chromosomal anomalies: Secondary | ICD-10-CM | POA: Insufficient documentation

## 2017-04-17 DIAGNOSIS — Z803 Family history of malignant neoplasm of breast: Secondary | ICD-10-CM

## 2017-04-17 NOTE — Telephone Encounter (Signed)
Revealed that a BRCA2 mutation was identified on his genetic testing.  He asked that I call his daughter Ivory Broad to reveal these results.  I called Altha Harm and let her know the test results.  Explained that she and her brothers, as well as her paternal cousins, needed to undergo genetic testing to determine if they too have this mutation.  Discussed medical management of women with BRCA2 mutations, including increased breast screening, mastectomy and oophorectomy.  I released a copy of the results to Altha Harm and provided her information on how to get ahold of genetic counselors in her area in order to get testing.  She asked that I call her dad's niece (from his second wife) who is a Marine scientist and could help explain the results to the family.  I LM on VM of niece, Andrey Campanile.

## 2017-04-17 NOTE — Progress Notes (Signed)
GENETIC TEST RESULTS   Patient Name: [name] Patient Age: 83 y.o. Encounter Date: 04/17/2017  Referring Provider: Curt Bears, MD    Mr. Hoffmeier was seen in the [facility] clinic on Apr 06, 2017 due to a personal and family history of cancer and concern regarding a hereditary predisposition to cancer in the family. Please refer to the prior Genetics clinic note for more information regarding Mr. Correia medical and family histories and our assessment at the time.   FAMILY HISTORY:  We obtained a detailed, 4-generation family history.  Significant diagnoses are listed below: Family History  Problem Relation Age of Onset  . Cancer Mother        Breast  . Cancer Sister        Breast  . Cancer Sister        possible breast cancer  . Colon cancer Neg Hx     The patient was not a good historian.  He has three children, one daughter and two sons.  He had four sisters, two who died of cancer, possibly breast cancer.  One is alive.  Three of the fours sisters had children.  The patient's mother had breast cancer when he was 25.  He does not know how old his mother was at her diagnosis.  The patient did not know of other cancer.  Mr. Garfinkel is unaware of previous family history of genetic testing for hereditary cancer risks. Patient's maternal ancestors are of Caucasian descent, and paternal ancestors are of Caucasian descent. There is no reported Ashkenazi Jewish ancestry. There is no known consanguinity.  GENETIC TESTING: At the time of Mr. Labrosse visit, we recommended he pursue genetic testing of the Common hereditary cancer test. The genetic testing reported on Apr 16, 2017 through the Common Hereditary Cancer Panel offered by [facility] identified a single, heterozygous pathogenic gene mutation called BRCA2, c.7480C>T. There were no deleterious mutations in  APC, ATM, AXIN2, BARD1, BMPR1A, BRCA1, BRIP1, CDH1, CDKN2A (p14ARF), CDKN2A (p16INK4a), CHEK2, CTNNA1, DICER1,  EPCAM (Deletion/duplication testing only), GREM1 (promoter region deletion/duplication testing only), KIT, MEN1, MLH1, MSH2, MSH3, MSH6, MUTYH, NBN, NF1, NHTL1, PALB2, PDGFRA, PMS2, POLD1, POLE, PTEN, RAD50, RAD51C, RAD51D, SDHB, SDHC, SDHD, SMAD4, SMARCA4. STK11, TP53, TSC1, TSC2, and VHL.  The following genes were evaluated for sequence changes only: SDHA and HOXB13 c.251G>A variant only.     MEDICAL MANAGEMENT: Women who have a BRCA mutation have an increased risk for both breast and ovarian cancer.   For men who harbor BRCA mutations, the general risk for cancer seems to be only slightly increased. As we discussed, BRCA mutations confer a slightly increased risk for breast cancer in men. We recommend that your physician perform a clinical breast exam yearly. Alternatively, you can be seen in the [facility]. A referral from a primary care physician to the cancer center will be needed.  Finally, if a breast mass is noticed, Mr. Ivery should be sure and have a physician evaluate it.   With regard to screening for other types of cancer, we estimate Mr. Paskett risk for prostate cancer to be increased over that of the general population risk. Therefore, prostate cancer screening should begin at age 55 for both PSA and digital rectal exams (DRE).  Lastly, we suggest adhering faithfully to the general population screening recommendations including annual colon cancer screening starting at the age of 51.  FAMILY MEMBERS: It is important that all of Mr. Gift relatives (both men and  women) know of the presence of this gene mutation. Site-specific genetic testing can sort out who in the family is at risk and who is not.   Mr. Albarran children and siblings have a 50% chance to have inherited this mutation. We recommend they have genetic testing for this same mutation, as identifying the presence of this mutation would allow them to also take advantage of risk-reducing  measures.   SUPPORT AND RESOURCES: If Mr. Saltzman is interested in BRCA-specific information and support, there are two groups, Facing Our Risk (www.facingourrisk.com) and Bright Pink (www.brightpink.org) which some people have found useful. They provide opportunities to speak with other individuals from high-risk families. To locate genetic counselors in other cities, visit the website of the [employer] (ArtistMovie.se) and Secretary/administrator for a Social worker by zip code.  We encouraged Mr. Onorato to remain in contact with [place] on an annual basis so we can update his personal and family histories, and let him know of advances in cancer genetics that may benefit the family. Our contact number was provided. Mr. Canal questions were answered to his satisfaction today, and he knows he is welcome to call anytime with additional questions.   Yolani Vo P. Florene Glen, [place], [facility] Certified Genetic Counselor [email or web address] phone: [phone]

## 2017-04-18 NOTE — Progress Notes (Signed)
Cardiology Office Note  Date: 04/19/2017   ID: ESLEY French, DOB 1926/12/05, MRN [id number]  PCP: Sharilyn Sites, MD  Primary Cardiologist: Rozann Lesches, MD   Chief Complaint  Patient presents with  . Atrial Fibrillation    History of Present Illness: [name] is an 83 y.o. male last seen in February. He presents for a routine follow-up visit. From a cardiac perspective, he does not report any chest pain or palpitations. I reviewed his chart, he had been treated with carboplatin and Alimta for metastatic non-small cell lung cancer, but did not tolerate treatment and this has been discontinued with consideration for palliative care and Hospice.  CHADSVASC score is 6. He continues on Eliquis. Recent lab work is outlined below. He does not report any spontaneous bleeding problems.  Past Medical History:  Diagnosis Date  . Adenocarcinoma of right lung, stage 4 ([name]) 10/13/2016  . Atrial fibrillation ([name])   . Brain metastasis ([name]) 11/03/2016  . Colon polyps   . Encounter for antineoplastic chemotherapy 12/07/2016  . Enlarged prostate   . Essential hypertension   . Family history of breast cancer   . Goals of care, counseling/discussion 11/17/2016  . History of kidney stones   . Hypercholesteremia   . Lung cancer ([name]) Dx'd 01/2007   Stage IA non-small cell lung cancer, moderately differentiated adenocarcinoma - VATS and seed implants  . Thoracic aortic aneurysm (HCC)    4.2 cm in AP diameter   . Type 2 diabetes mellitus ([name])     Past Surgical History:  Procedure Laterality Date  . APPENDECTOMY    . CERVICAL SPINE SURGERY    . CHOLECYSTECTOMY    . COLONOSCOPY  04/17/2012   Procedure: COLONOSCOPY;  Surgeon: Jamesetta So, MD;  Location[facility];  Service: Gastroenterology;  Laterality: N/A;  . COLONOSCOPY W/ POLYPECTOMY    . TONSILLECTOMY    . wedge resection with seed implantation and node sampling with right VATS  03/13/2007    Current Outpatient  Prescriptions  Medication Sig Dispense Refill  . albuterol (PROVENTIL HFA;VENTOLIN HFA) 108 (90 Base) MCG/ACT inhaler Inhale 2 puffs into the lungs every 6 (six) hours as needed for wheezing or shortness of breath. 1 Inhaler 2  . AMBULATORY NON FORMULARY MEDICATION Medication Name: MAGIC MOUTHWASH 2 % Viscous Lidocaine  Maalox Benadryl Susp.  Disp: 1:1:1 278m bottle Sig: 156mPO Swish & Spit  q 3-4hrs. PRN mouth sores 200 mL 0  . atorvastatin (LIPITOR) 20 MG tablet Take 1 tablet (20 mg total) by mouth at bedtime. 30 tablet 0  . dexamethasone (DECADRON) 4 MG tablet 4 mg by mouth twice a day the day before, day of and day after the chemotherapy every 3 weeks 40 tablet 1  . docusate sodium (COLACE) 100 MG capsule Take 100 mg by mouth 2 (two) times daily.    . Marland KitchenLIQUIS 5 MG TABS tablet Take 5 mg by mouth 2 (two) times daily.     . furosemide (LASIX) 40 MG tablet Take 40 mg by mouth daily as needed.    . Marland KitchenuaiFENesin-dextromethorphan (ROBITUSSIN DM) 100-10 MG/5ML syrup Take 5 mLs by mouth every 4 (four) hours as needed for cough. 118 mL 0  . JANUMET 50-1000 MG tablet 1 tablet 2 (two) times daily.    . Marland KitchenORazepam (ATIVAN) 1 MG tablet Take 0.5 tablets (0.5 mg total) by mouth every 8 (eight) hours as needed for anxiety (30 min before MRI). 15 tablet 0  . losartan (COZAAR)  50 MG tablet Take 50 mg by mouth daily.    . metoprolol tartrate (LOPRESSOR) 25 MG tablet Take 25 mg by mouth 2 (two) times daily.    Marland Kitchen oxybutynin (DITROPAN-XL) 10 MG 24 hr tablet Take 10 mg by mouth at bedtime.    . prochlorperazine (COMPAZINE) 10 MG tablet Take 1 tablet (10 mg total) by mouth every 6 (six) hours as needed for nausea or vomiting. 30 tablet 0   No current facility-administered medications for this visit.    Allergies:  Sulfa antibiotics   Social History: The patient  reports that he quit smoking about 52 years ago. His smoking use included Cigarettes. He has a 22.50 pack-year smoking history. He has never used  smokeless tobacco. He reports that he does not drink alcohol or use drugs.   ROS:  Please see the history of present illness. Otherwise, complete review of systems is positive for chronic waxing and waning leg edema which is been stable.  All other systems are reviewed and negative.   Physical Exam: VS:  BP 108/64   Pulse 88   Ht _0  (1.778 m)   Wt 215 lb (97.5 kg)   SpO2 96%   BMI 30.85 kg/m , BMI Body mass index is 30.85 kg/m.  Wt Readings from Last 3 Encounters:  04/19/17 215 lb (97.5 kg)  02/22/17 206 lb 4.8 oz (93.6 kg)  01/23/17 203 lb (92.1 kg)    General: Obese elderly male,appears comfortable at rest. HEENT: Conjunctiva and lids normal, oropharynx clear. Neck: Supple, no elevated JVP or carotid bruits, no thyromegaly. Lungs: Clear to auscultation, nonlabored breathing at rest. Cardiac: Irregularly irregular, no S3, softsystolic murmur, no pericardial rub. Abdomen: Protuberant, nontender, bowel sounds present, no guarding or rebound. Extremities: 2+ bilateral lower leg edema, distal pulses 2+. Skin: Warm and dry. Musculoskeletal: No kyphosis. Neuropsychiatric: Alert and oriented x3, affect grossly appropriate.  ECG: I personally reviewed the tracing from 12/20/2016 which showed atrial fibrillation with RVR, low voltage, nonspecific ST-T changes.  Recent Labwork: 08/25/2016: TSH 1.225 09/30/2016: B Natriuretic Peptide 72.0 12/21/2016: Magnesium 2.1 02/22/2017: ALT 13; AST 12; BUN 15.7; Creatinine 0.8; HGB 10.9; Platelets 233; Potassium 4.2; Sodium 131   Other Studies Reviewed Today:  Echocardiogram 08/26/2016: Study Conclusions  - Left ventricle: The cavity size was normal. Wall thickness was   increased in a pattern of mild LVH. Systolic function was mildly   reduced. The estimated ejection fraction was in the range of 45%   to 50%. Although no diagnostic regional wall motion abnormality   was identified, this possibility cannot be completely excluded on   the  basis of this study. The study is not technically sufficient   to allow evaluation of LV diastolic function. - Aortic valve: Moderately calcified annulus. Probably trileaflet;   mildly calcified leaflets. There was mild stenosis. There was   trivial regurgitation. Mean gradient (S): 8 mm Hg. Peak gradient   (S): 15 mm Hg. VTI ratio of LVOT to aortic valve: 0.46. Valve   area (VTI): 1.46 cm^2. Valve area (Vmax): 1.46 cm^2. Valve area   (Vmean): 1.64 cm^2. - Aortic root: The aortic root was mildly dilated. - Mitral valve: Severely calcified annulus. There was trivial   regurgitation. - Left atrium: The atrium was mildly dilated. - Right atrium: The atrium was mildly dilated. Central venous   pressure (est): 8 mm Hg. - Tricuspid valve: There was mild regurgitation. - Pulmonary arteries: PA peak pressure: 39 mm Hg (S). - Pericardium, extracardiac: A  small pericardial effusion was   identified posterior to the heart.  Impressions:  - Mild LVH with LVEF approximately 45-50% in the setting of atrial   fibrillation. Indeterminate diastolic function. Mild left atrial   enlargement. Severe MAC with trivial mitral regurgitation. Mild   calcific aortic stenosis with trivial aortic regurgitation.   Mildly dilated aortic root. Mild tricuspid regurgitation with   PASP 39 mmHg. Small posterior pericardial effusion.  Assessment and Plan:  1. Persistent atrial fibrillation, continues to tolerate well without palpitations. Continue heart rate control with metoprolol and stroke prophylaxis on Eliquis. I reviewed his recent lab work showing stable hemoglobin and creatinine.  2. Multivessel coronary artery calcifications by chest CT imaging without angina symptoms. Do not plan further ischemic workup in light of other comorbidities.  3. Asymptomatic 4.2 cm thoracic aortic aneurysm. No further workup planned.  4. Chronic recurring leg edema, continue Lasix and elevate legs when able.   Current  medicines were reviewed with the patient today.  Disposition: Follow-up in 4 months.  Signed, Satira Sark, MD, [facility] 04/19/2017 9:00 AM    [place] at [facility] [address], [place], [place] [address] Phone: [phone]; Fax: [phone]

## 2017-04-19 ENCOUNTER — Encounter: Payer: Self-pay | Admitting: Cardiology

## 2017-04-19 ENCOUNTER — Ambulatory Visit (INDEPENDENT_AMBULATORY_CARE_PROVIDER_SITE_OTHER): Payer: Medicare Other | Admitting: Cardiology

## 2017-04-19 VITALS — BP 108/64 | HR 88 | Ht 70.0 in | Wt 215.0 lb

## 2017-04-19 DIAGNOSIS — I712 Thoracic aortic aneurysm, without rupture, unspecified: Secondary | ICD-10-CM

## 2017-04-19 DIAGNOSIS — R600 Localized edema: Secondary | ICD-10-CM

## 2017-04-19 DIAGNOSIS — I25810 Atherosclerosis of coronary artery bypass graft(s) without angina pectoris: Secondary | ICD-10-CM

## 2017-04-19 DIAGNOSIS — I481 Persistent atrial fibrillation: Secondary | ICD-10-CM

## 2017-04-19 DIAGNOSIS — I4819 Other persistent atrial fibrillation: Secondary | ICD-10-CM

## 2017-04-19 NOTE — Patient Instructions (Signed)
Your physician recommends that you schedule a follow-up appointment in: 4 months with Dr Domenic Polite    Your physician recommends that you continue on your current medications as directed. Please refer to the Current Medication list given to you today.    If you need a refill on your cardiac medications before your next appointment, please call your pharmacy.      Thank you for choosing [facility] !

## 2017-06-05 DIAGNOSIS — E6609 Other obesity due to excess calories: Secondary | ICD-10-CM | POA: Diagnosis not present

## 2017-06-05 DIAGNOSIS — M545 Low back pain: Secondary | ICD-10-CM | POA: Diagnosis not present

## 2017-06-05 DIAGNOSIS — Z1389 Encounter for screening for other disorder: Secondary | ICD-10-CM | POA: Diagnosis not present

## 2017-06-05 DIAGNOSIS — Z6832 Body mass index (BMI) 32.0-32.9, adult: Secondary | ICD-10-CM | POA: Diagnosis not present

## 2017-06-13 ENCOUNTER — Ambulatory Visit (HOSPITAL_COMMUNITY)
Admission: RE | Admit: 2017-06-13 | Discharge: 2017-06-13 | Disposition: A | Discharge status: Home / Self Care | Payer: Medicare Other | Source: Ambulatory Visit | Attending: Internal Medicine | Admitting: Internal Medicine

## 2017-06-13 ENCOUNTER — Other Ambulatory Visit (HOSPITAL_COMMUNITY): Payer: Self-pay | Admitting: Internal Medicine

## 2017-06-13 DIAGNOSIS — C3491 Malignant neoplasm of unspecified part of right bronchus or lung: Secondary | ICD-10-CM | POA: Diagnosis not present

## 2017-06-13 DIAGNOSIS — M5126 Other intervertebral disc displacement, lumbar region: Secondary | ICD-10-CM | POA: Insufficient documentation

## 2017-06-13 DIAGNOSIS — M545 Low back pain: Secondary | ICD-10-CM | POA: Diagnosis not present

## 2017-06-13 DIAGNOSIS — M4856XA Collapsed vertebra, not elsewhere classified, lumbar region, initial encounter for fracture: Secondary | ICD-10-CM | POA: Insufficient documentation

## 2017-06-13 DIAGNOSIS — M549 Dorsalgia, unspecified: Secondary | ICD-10-CM | POA: Diagnosis not present

## 2017-06-13 DIAGNOSIS — N200 Calculus of kidney: Secondary | ICD-10-CM | POA: Diagnosis not present

## 2017-06-13 DIAGNOSIS — M62830 Muscle spasm of back: Secondary | ICD-10-CM | POA: Diagnosis not present

## 2017-06-13 DIAGNOSIS — I700 Atherosclerosis of aorta: Secondary | ICD-10-CM | POA: Insufficient documentation

## 2017-06-13 DIAGNOSIS — E6609 Other obesity due to excess calories: Secondary | ICD-10-CM | POA: Diagnosis not present

## 2017-06-13 DIAGNOSIS — M48061 Spinal stenosis, lumbar region without neurogenic claudication: Secondary | ICD-10-CM | POA: Insufficient documentation

## 2017-06-13 DIAGNOSIS — Z6831 Body mass index (BMI) 31.0-31.9, adult: Secondary | ICD-10-CM | POA: Diagnosis not present

## 2017-06-14 ENCOUNTER — Other Ambulatory Visit (HOSPITAL_COMMUNITY): Payer: Self-pay | Admitting: Internal Medicine

## 2017-06-14 ENCOUNTER — Ambulatory Visit (HOSPITAL_COMMUNITY)
Admission: RE | Admit: 2017-06-14 | Discharge: 2017-06-14 | Disposition: A | Discharge status: Home / Self Care | Payer: Medicare Other | Source: Ambulatory Visit | Attending: Internal Medicine | Admitting: Internal Medicine

## 2017-06-14 DIAGNOSIS — M438X6 Other specified deforming dorsopathies, lumbar region: Secondary | ICD-10-CM | POA: Insufficient documentation

## 2017-06-14 DIAGNOSIS — S32000A Wedge compression fracture of unspecified lumbar vertebra, initial encounter for closed fracture: Secondary | ICD-10-CM

## 2017-06-14 DIAGNOSIS — S32029A Unspecified fracture of second lumbar vertebra, initial encounter for closed fracture: Secondary | ICD-10-CM | POA: Diagnosis present

## 2017-06-16 DIAGNOSIS — C7931 Secondary malignant neoplasm of brain: Secondary | ICD-10-CM | POA: Diagnosis not present

## 2017-06-16 DIAGNOSIS — I10 Essential (primary) hypertension: Secondary | ICD-10-CM | POA: Diagnosis not present

## 2017-06-16 DIAGNOSIS — S32020A Wedge compression fracture of second lumbar vertebra, initial encounter for closed fracture: Secondary | ICD-10-CM | POA: Diagnosis not present

## 2017-06-20 ENCOUNTER — Encounter (HOSPITAL_COMMUNITY): Payer: Self-pay | Admitting: Emergency Medicine

## 2017-06-20 ENCOUNTER — Emergency Department (HOSPITAL_COMMUNITY): Payer: Medicare Other

## 2017-06-20 ENCOUNTER — Emergency Department (HOSPITAL_COMMUNITY)
Admission: EM | Admit: 2017-06-20 | Discharge: 2017-06-20 | Disposition: A | Discharge status: Home / Self Care | Payer: Medicare Other | Attending: Emergency Medicine | Admitting: Emergency Medicine

## 2017-06-20 DIAGNOSIS — E119 Type 2 diabetes mellitus without complications: Secondary | ICD-10-CM | POA: Diagnosis not present

## 2017-06-20 DIAGNOSIS — I110 Hypertensive heart disease with heart failure: Secondary | ICD-10-CM | POA: Diagnosis not present

## 2017-06-20 DIAGNOSIS — I5022 Chronic systolic (congestive) heart failure: Secondary | ICD-10-CM | POA: Diagnosis not present

## 2017-06-20 DIAGNOSIS — Z7901 Long term (current) use of anticoagulants: Secondary | ICD-10-CM | POA: Insufficient documentation

## 2017-06-20 DIAGNOSIS — Z87891 Personal history of nicotine dependence: Secondary | ICD-10-CM | POA: Insufficient documentation

## 2017-06-20 DIAGNOSIS — K5903 Drug induced constipation: Secondary | ICD-10-CM | POA: Insufficient documentation

## 2017-06-20 DIAGNOSIS — K5900 Constipation, unspecified: Secondary | ICD-10-CM | POA: Diagnosis not present

## 2017-06-20 DIAGNOSIS — R195 Other fecal abnormalities: Secondary | ICD-10-CM | POA: Diagnosis not present

## 2017-06-20 DIAGNOSIS — Z79899 Other long term (current) drug therapy: Secondary | ICD-10-CM | POA: Insufficient documentation

## 2017-06-20 DIAGNOSIS — E871 Hypo-osmolality and hyponatremia: Secondary | ICD-10-CM | POA: Insufficient documentation

## 2017-06-20 DIAGNOSIS — Y33XXXD Other specified events, undetermined intent, subsequent encounter: Secondary | ICD-10-CM | POA: Diagnosis not present

## 2017-06-20 DIAGNOSIS — Z6832 Body mass index (BMI) 32.0-32.9, adult: Secondary | ICD-10-CM | POA: Diagnosis not present

## 2017-06-20 DIAGNOSIS — S32040D Wedge compression fracture of fourth lumbar vertebra, subsequent encounter for fracture with routine healing: Secondary | ICD-10-CM | POA: Diagnosis not present

## 2017-06-20 DIAGNOSIS — R1030 Lower abdominal pain, unspecified: Secondary | ICD-10-CM | POA: Diagnosis not present

## 2017-06-20 DIAGNOSIS — C3490 Malignant neoplasm of unspecified part of unspecified bronchus or lung: Secondary | ICD-10-CM | POA: Diagnosis not present

## 2017-06-20 DIAGNOSIS — M545 Low back pain: Secondary | ICD-10-CM | POA: Diagnosis present

## 2017-06-20 DIAGNOSIS — E6609 Other obesity due to excess calories: Secondary | ICD-10-CM | POA: Diagnosis not present

## 2017-06-20 LAB — CBC
HEMATOCRIT: 41.4 % (ref 39.0–52.0)
Hemoglobin: 14 g/dL (ref 13.0–17.0)
MCH: 26.9 pg (ref 26.0–34.0)
MCHC: 33.8 g/dL (ref 30.0–36.0)
MCV: 79.5 fL (ref 78.0–100.0)
PLATELETS: 210 10*3/uL (ref 150–400)
RBC: 5.21 MIL/uL (ref 4.22–5.81)
RDW: 15.9 % — AB (ref 11.5–15.5)
WBC: 11.2 10*3/uL — AB (ref 4.0–10.5)

## 2017-06-20 LAB — COMPREHENSIVE METABOLIC PANEL
ALT: 15 U/L — ABNORMAL LOW (ref 17–63)
AST: 17 U/L (ref 15–41)
Albumin: 3.7 g/dL (ref 3.5–5.0)
Alkaline Phosphatase: 77 U/L (ref 38–126)
Anion gap: 12 (ref 5–15)
BILIRUBIN TOTAL: 0.9 mg/dL (ref 0.3–1.2)
BUN: 18 mg/dL (ref 6–20)
CHLORIDE: 88 mmol/L — AB (ref 101–111)
CO2: 25 mmol/L (ref 22–32)
Calcium: 9.9 mg/dL (ref 8.9–10.3)
Creatinine, Ser: 0.69 mg/dL (ref 0.61–1.24)
GFR calc non Af Amer: >60 mL/min (ref >60)
Glucose, Bld: 217 mg/dL — ABNORMAL HIGH (ref 65–99)
POTASSIUM: 4.2 mmol/L (ref 3.5–5.1)
Sodium: 125 mmol/L — ABNORMAL LOW (ref 135–145)
TOTAL PROTEIN: 6.8 g/dL (ref 6.5–8.1)

## 2017-06-20 LAB — POC OCCULT BLOOD, ED: FECAL OCCULT BLD: NEGATIVE

## 2017-06-20 MED ORDER — SODIUM CHLORIDE 0.9 % IV BOLUS (SEPSIS)
1000.0000 mL | Freq: Once | INTRAVENOUS | Status: AC
  Administered 2017-06-20: 1000 mL via INTRAVENOUS

## 2017-06-20 MED ORDER — IOPAMIDOL (ISOVUE-300) INJECTION 61%
100.0000 mL | Freq: Once | INTRAVENOUS | Status: AC | PRN
  Administered 2017-06-20: 100 mL via INTRAVENOUS

## 2017-06-20 MED ORDER — MAGNESIUM CITRATE PO SOLN: 1.0000 | Freq: Once | ORAL | 0 refills | Status: AC

## 2017-06-20 NOTE — Discharge Instructions (Addendum)
Take mag citrate as prescribed. Hold lasix for two days and recheck of sodium level with Dr. Hilma Favors Follow up with your oncologist

## 2017-06-20 NOTE — ED Notes (Signed)
Pt states that he has tried "everything" but denies using any enemas, denies using Mag Citrate, denies using Miralax or any laxative.  Pt admits to trying lemon juice in hot water and stool softeners

## 2017-06-20 NOTE — ED Triage Notes (Signed)
Dr Armandina Gemma called and sent pt over for CT, pt has not had a BM in 16 days. Complaining on back pain

## 2017-06-20 NOTE — ED Provider Notes (Signed)
[facility] DEPT Provider Note   CSN: [id number] Arrival date & time: 06/20/17  1121     History   Chief Complaint Chief Complaint  Patient presents with  . Fecal Impaction    HPI [name] is a 83 y.o. male.  HPI 83 year old man presents today stating that he has had right-sided low back pain for the past 17 days. He states that it began fairly abruptly. He has been seen by his primary care physician's office twice. He denies any injury. He states he has a fractured vertebrae. He states he has been taking pain medications. He has not had a bowel movement for 17 days and he thinks that he is constipated. He states that he has been taking stool softeners. He denies any abdominal pain, change in appetite, nausea, or vomiting. He states that he normally has a bowel movement every day or so. He denies any loss of bowel or bladder control. He states that he urinates frequently due to medication but otherwise has not had any change in his urinary status. He denies any loss of bowel control. He states that Dr. Hilma Favors underwent him to come in for evaluation due to constipation and concern about infection. I have reviewed the nurse's notes state that Dr. Hilma Favors called and patient was to have CT scan. In the nurse's note, is noted that he is complaining of back pain. The patient states that the back pain has been 5 out of 10. Has not changed recently. He is supposed to be wearing a brace. He thought that he could get in here today without any problem from that he is getting around at home and is ambulatory. He denies any numbness, tingling, weakness, or numbness in the perineal area. He does have a history of lung cancer and is on eliquis. Past Medical History:  Diagnosis Date  . Adenocarcinoma of right lung, stage 4 ([place]) 10/13/2016  . Atrial fibrillation ([place])   . Brain metastasis ([place]) 11/03/2016  . Colon polyps   . Encounter for antineoplastic chemotherapy 12/07/2016  . Enlarged  prostate   . Essential hypertension   . Family history of breast cancer   . Goals of care, counseling/discussion 11/17/2016  . History of kidney stones   . Hypercholesteremia   . Lung cancer ([place]) Dx'd 01/2007   Stage IA non-small cell lung cancer, moderately differentiated adenocarcinoma - VATS and seed implants  . Thoracic aortic aneurysm (HCC)    4.2 cm in AP diameter   . Type 2 diabetes mellitus [facility])     Patient Active Problem List   Diagnosis Date Noted  . Genetic testing 04/17/2017  . BRCA2 positive 04/17/2017  . Family history of breast cancer   . Influenza with respiratory manifestation 12/22/2016  . Dehydration 12/22/2016  . Chronic systolic CHF (congestive heart failure) ([place]) 12/22/2016  . Sepsis ([place]) 12/21/2016  . SAH (subarachnoid hemorrhage) ([place]) 12/19/2016  . Fall 12/19/2016  . Encounter for antineoplastic chemotherapy 12/07/2016  . Bronchitis 11/23/2016  . Peripheral edema 11/23/2016  . Goals of care, counseling/discussion 11/17/2016  . 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 Palliative care by specialist   . DNR (do not resuscitate)   . Adenocarcinoma of right lung, stage 4 ([place]) 10/13/2016  . Shortness of breath at rest 10/13/2016  . Atrial fibrillation with rapid ventricular response ([place]) 08/25/2016  . Chest pain 08/25/2016  . DM type 2 (diabetes mellitus, type 2) ([place]) 08/25/2016  . Aortic atherosclerosis ([place]) 08/25/2016  .  Thoracic aortic aneurysm ([place]) 08/25/2016  . Hx of cancer of lung 11/07/2011  . Hypercholesteremia   . History of kidney stones     Past Surgical History:  Procedure Laterality Date  . APPENDECTOMY    . CERVICAL SPINE SURGERY    . CHOLECYSTECTOMY    . COLONOSCOPY  04/17/2012   Procedure: COLONOSCOPY;  Surgeon: Jamesetta So, MD;  Location: [facility]ITE;  Service: Gastroenterology;  Laterality: N/A;  . COLONOSCOPY W/ POLYPECTOMY    . TONSILLECTOMY    . wedge resection with seed implantation and node  sampling with right VATS  03/13/2007       Home Medications    Prior to Admission medications   Medication Sig Start Date End Date Taking? Authorizing Provider  albuterol (PROVENTIL HFA;VENTOLIN HFA) 108 (90 Base) MCG/ACT inhaler Inhale 2 puffs into the lungs every 6 (six) hours as needed for wheezing or shortness of breath. 10/13/16  Yes Curt Bears, MD  AMBULATORY NON FORMULARY MEDICATION Medication Name: MAGIC MOUTHWASH 2 % Viscous Lidocaine  Maalox Benadryl Susp.  Disp: 1:1:1 231m bottle Sig: 176mPO Swish & Spit  q 3-4hrs. PRN mouth sores 01/31/17  Yes MoCurt BearsMD  atorvastatin (LIPITOR) 20 MG tablet Take 1 tablet (20 mg total) by mouth at bedtime. 12/22/16  Yes RiDebbe OdeaMD  dexamethasone (DECADRON) 4 MG tablet 4 mg by mouth twice a day the day before, day of and day after the chemotherapy every 3 weeks 11/17/16  Yes MoCurt BearsMD  docusate sodium (COLACE) 100 MG capsule Take 100 mg by mouth 2 (two) times daily.   Yes [provider]  ELIQUIS 5 MG TABS tablet Take 5 mg by mouth 2 (two) times daily.  11/08/16  Yes [provider]  furosemide (LASIX) 40 MG tablet Take 40 mg by mouth daily as needed.   Yes [provider]  guaiFENesin-dextromethorphan (ROBITUSSIN DM) 100-10 MG/5ML syrup Take 5 mLs by mouth every 4 (four) hours as needed for cough. 12/22/16  Yes Rizwan, SaEunice BlaseMD  JANUMET 50-1000 MG tablet 1 tablet 2 (two) times daily. 07/21/16  Yes [provider]  LORazepam (ATIVAN) 1 MG tablet Take 0.5 tablets (0.5 mg total) by mouth every 8 (eight) hours as needed for anxiety (30 min before MRI). 11/08/16  Yes MaTyler PitaMD  losartan (COZAAR) 50 MG tablet Take 50 mg by mouth daily.   Yes [provider]  metoprolol tartrate (LOPRESSOR) 25 MG tablet Take 25 mg by mouth 2 (two) times daily.   Yes [provider]  oxybutynin (DITROPAN-XL) 10 MG 24 hr tablet Take 10 mg by mouth at bedtime.   Yes [provider]  prochlorperazine (COMPAZINE) 10 MG tablet Take 1 tablet (10 mg total) by mouth every 6 (six) hours as needed for nausea or vomiting. 11/17/16  Yes MoCurt BearsMD    Family History Family History  Problem Relation Age of Onset  . Cancer Mother        Breast  . Cancer Sister        Breast  . Cancer Sister        possible breast cancer  . Colon cancer Neg Hx     Social History Social History  Substance Use Topics  . Smoking status: Former Smoker    Packs/day: 1.50    Years: 15.00    Types: Cigarettes    Quit date: 11/28/1964  . Smokeless tobacco: Never Used  . Alcohol use No  Allergies   Sulfa antibiotics   Review of Systems Review of Systems  Constitutional: Negative.   HENT: Negative.   Eyes: Negative.   Respiratory: Negative.  Negative for cough, chest tightness and shortness of breath.   Cardiovascular: Negative.  Negative for chest pain and leg swelling.  Gastrointestinal: Positive for constipation. Negative for abdominal distention, abdominal pain, anal bleeding, diarrhea, rectal pain and vomiting.  Endocrine: Negative.   Genitourinary: Positive for frequency. Negative for decreased urine volume, flank pain, hematuria, penile swelling, scrotal swelling, testicular pain and urgency.  Musculoskeletal: Positive for back pain. Negative for gait problem and joint swelling.  Skin: Positive for wound.       2 sore spots on low back  Allergic/Immunologic: Negative.   Neurological: Negative.  Negative for weakness.  Hematological: Negative.   Psychiatric/Behavioral: Negative.   All other systems reviewed and are negative.    Physical Exam Updated Vital Signs BP (!) 121/57 (BP Location: Right Arm)   Temp 97.7 F (36.5 C) (Oral)   Resp 20   Ht 1.753 m (_0 )   Wt 97.5 kg (215 lb)   SpO2 99%   BMI 31.75 kg/m   Physical Exam  Constitutional: He is oriented to person, place, and time. He appears well-developed.  HENT:  Head:  Normocephalic and atraumatic.  Right Ear: External ear normal.  Left Ear: External ear normal.  Nose: Nose normal.  Eyes: Pupils are equal, round, and reactive to light. Conjunctivae and EOM are normal.  Neck: Normal range of motion. Neck supple. No tracheal deviation present.  Cardiovascular: Regular rhythm.   Pulmonary/Chest: Effort normal.  Abdominal: Soft. Bowel sounds are normal. He exhibits no distension and no mass. There is no tenderness. There is no rebound and no guarding. No hernia.  Genitourinary: Rectum normal, prostate normal and penis normal.  Genitourinary Comments: Some soft stool in rectal vault-nttp  Musculoskeletal: Normal range of motion.  Neurological: He is alert and oriented to person, place, and time.  Skin: Skin is warm and dry. Capillary refill takes less than 2 seconds.     2 dime sized areas of skin break down over ischium  Psychiatric: He has a normal mood and affect. His behavior is normal.  Nursing note and vitals reviewed.    ED Treatments / Results  Labs (all labs ordered are listed, but only abnormal results are displayed) Labs Reviewed  CBC - Abnormal; Notable for the following:       Result Value   WBC 11.2 (*)    RDW 15.9 (*)    All other components within normal limits  COMPREHENSIVE METABOLIC PANEL  POC OCCULT BLOOD, ED    EKG  EKG Interpretation None       Radiology No results found.  Procedures Procedures (including critical care time)  Medications Ordered in ED Medications - No data to display   Initial Impression / Assessment and Plan / ED Course  I have reviewed the triage vital signs and the nursing notes.  Pertinent labs & imaging results that were available during my care of the patient were reviewed by me and considered in my medical decision making (see chart for details).    83 y.o. Male with complaint of no bm for 17 days.  Reports good po intake, no abdominal or rctal pain.  Exam normal.  Sodium low at  125- previously has been 127. One liter ns bolus giiven and will need recheck.   1- no bowel movement-discussed with Dr. Hilma Favors and will  obtain abd ct 2- hyponatremia- patient is on lasix- will hold lasix 3- metastatic lung cancer   CT report did not crossover in epic however report is faxed to me. Impression 1 possible constipation 2 calcification region of pancreatic head indeterminate though this could represent a common bile duct stone 3 progressive metastatic disease in the lower right chest with new left adrenal nodules/metastases L2 compression deformity with mild underlying sclerosis 4 bilateral nephrolithiasis without obstructive uropathy 5 subtle hyperattenuation in the bladder favored to represent early contrast excretion Please reference full read. Final Clinical Impressions(s) / ED Diagnoses   Final diagnoses:  Change in stool  Drug-induced constipation  Primary malignant neoplasm of lung metastatic to other site, unspecified laterality (HCC)  Hyponatremia  Closed compression fracture of L4 lumbar vertebra with routine healing, subsequent encounter    New Prescriptions New Prescriptions   MAGNESIUM CITRATE SOLN    Take 296 mLs (1 Bottle total) by mouth once.     Pattricia Boss, MD 06/20/17 [phone]

## 2017-06-22 DIAGNOSIS — S32020A Wedge compression fracture of second lumbar vertebra, initial encounter for closed fracture: Secondary | ICD-10-CM | POA: Diagnosis not present

## 2017-06-23 ENCOUNTER — Encounter: Payer: Self-pay | Admitting: Radiation Therapy

## 2017-06-23 ENCOUNTER — Other Ambulatory Visit: Payer: Self-pay | Admitting: Radiation Therapy

## 2017-06-23 DIAGNOSIS — C7931 Secondary malignant neoplasm of brain: Secondary | ICD-10-CM

## 2017-06-23 DIAGNOSIS — C7949 Secondary malignant neoplasm of other parts of nervous system: Principal | ICD-10-CM

## 2017-06-23 NOTE — Progress Notes (Signed)
I was unable to reach Mr. [name] on the phone or leave a message since his VM is not set up. On 7/27,  I sent out a card with the times for his upcoming MRI and follow-up appointment so that he is aware. I also included my contact information and asked that he call me if he has questions or concerns about his appointments.   Mont Dutton R.T.(R)(T) Special Procedures Navigator

## 2017-06-27 DIAGNOSIS — M545 Low back pain: Secondary | ICD-10-CM | POA: Diagnosis not present

## 2017-06-27 DIAGNOSIS — Z6830 Body mass index (BMI) 30.0-30.9, adult: Secondary | ICD-10-CM | POA: Diagnosis not present

## 2017-06-27 DIAGNOSIS — S32020A Wedge compression fracture of second lumbar vertebra, initial encounter for closed fracture: Secondary | ICD-10-CM | POA: Diagnosis not present

## 2017-06-29 NOTE — Progress Notes (Addendum)
Histology and Location of Primary Cancer: stage IV (T3, N2, M1 A) poorly differentiated adenocarcinoma presented with large right upper lobe lung mass AND 8 mm left thalamic brain met     Location(s) of Symptomatic tumor(s): left thalamic brain met and L2 deformity compression  Past/Anticipated chemotherapy by medical oncology, if any:  Systemic chemotherapy with carboplatin for AUC of 5, Alimta 500 MG/M2 and Avastin 15 MG/KG every 3 weeks. First dose 08/30/2016. Status post 2 cycles. Starting from cycle #2 carboplatin will be for AUC of 4 and Alimta 400 MG/M2.  Patient's main complaints related to symptomatic tumor(s) are: right sided low back pain (History of L2 compression deformity)  Pain on a scale of 0-10 is: 5 on a scale of 0-10 related to L2 compression deformity. Patient denies taking prescribed or OTC pain medication.   If Spine Met(s), symptoms, if any, include:  Bowel/Bladder retention or incontinence (please describe): constipation  Numbness or weakness in extremities (please describe): no  Current Decadron regimen, if applicable: Only when receiving chemo  Ambulatory status? Walker? Wheelchair?: ambulatory  SAFETY ISSUES:  Prior radiation? Yes  Radiation treatment dates:   11/02/16-11/15/16  Site/dose:    1.  The right upper lung was treated 30 Gy in 10 fractions of 3 Gy 2.  Left thalamic 8 mmtarget was treated to a prescription dose of 20Gy.    Pacemaker/ICD? No  Possible current pregnancy? No   Is the patient on methotrexate? No   Additional Complaints / other details:  83 year old male. Has back brace but, isn't wearing it. Has lost 30 lb since December.

## 2017-07-03 ENCOUNTER — Encounter: Payer: Self-pay | Admitting: Radiation Oncology

## 2017-07-03 ENCOUNTER — Ambulatory Visit
Admission: RE | Admit: 2017-07-03 | Discharge: 2017-07-03 | Disposition: A | Discharge status: Home / Self Care | Payer: Medicare Other | Source: Ambulatory Visit | Attending: Radiation Oncology | Admitting: Radiation Oncology

## 2017-07-03 DIAGNOSIS — Z79899 Other long term (current) drug therapy: Secondary | ICD-10-CM | POA: Diagnosis not present

## 2017-07-03 DIAGNOSIS — Z7901 Long term (current) use of anticoagulants: Secondary | ICD-10-CM | POA: Insufficient documentation

## 2017-07-03 DIAGNOSIS — C7949 Secondary malignant neoplasm of other parts of nervous system: Secondary | ICD-10-CM

## 2017-07-03 DIAGNOSIS — Z923 Personal history of irradiation: Secondary | ICD-10-CM | POA: Diagnosis not present

## 2017-07-03 DIAGNOSIS — Z7984 Long term (current) use of oral hypoglycemic drugs: Secondary | ICD-10-CM | POA: Diagnosis not present

## 2017-07-03 DIAGNOSIS — Z882 Allergy status to sulfonamides status: Secondary | ICD-10-CM | POA: Diagnosis not present

## 2017-07-03 DIAGNOSIS — C7952 Secondary malignant neoplasm of bone marrow: Principal | ICD-10-CM

## 2017-07-03 DIAGNOSIS — Z510 Encounter for antineoplastic radiation therapy: Secondary | ICD-10-CM | POA: Insufficient documentation

## 2017-07-03 DIAGNOSIS — T402X5A Adverse effect of other opioids, initial encounter: Secondary | ICD-10-CM | POA: Diagnosis not present

## 2017-07-03 DIAGNOSIS — M545 Low back pain: Secondary | ICD-10-CM | POA: Diagnosis not present

## 2017-07-03 DIAGNOSIS — C7951 Secondary malignant neoplasm of bone: Secondary | ICD-10-CM

## 2017-07-03 DIAGNOSIS — Z85118 Personal history of other malignant neoplasm of bronchus and lung: Secondary | ICD-10-CM | POA: Diagnosis not present

## 2017-07-03 DIAGNOSIS — K5900 Constipation, unspecified: Secondary | ICD-10-CM | POA: Insufficient documentation

## 2017-07-03 DIAGNOSIS — F40240 Claustrophobia: Secondary | ICD-10-CM | POA: Diagnosis not present

## 2017-07-03 DIAGNOSIS — K5903 Drug induced constipation: Secondary | ICD-10-CM | POA: Insufficient documentation

## 2017-07-03 DIAGNOSIS — C3411 Malignant neoplasm of upper lobe, right bronchus or lung: Secondary | ICD-10-CM

## 2017-07-03 DIAGNOSIS — C7931 Secondary malignant neoplasm of brain: Secondary | ICD-10-CM | POA: Insufficient documentation

## 2017-07-03 DIAGNOSIS — Z85841 Personal history of malignant neoplasm of brain: Secondary | ICD-10-CM | POA: Diagnosis not present

## 2017-07-03 MED ORDER — LORAZEPAM 0.5 MG PO TABS: 0.5000 mg | ORAL_TABLET | Freq: Three times a day (TID) | ORAL | 0 refills | Status: AC | PRN

## 2017-07-03 NOTE — Progress Notes (Deleted)
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  Interval Since Last Radiation:  4 weeks   11/17/16 SRS Treatment:  Left thalamic 8 mm target was treated using 4 Dynamic Conformal Arcs to a prescription dose of 20 Gy.  ExacTrac registration was performed for each couch angle.  The 80% isodose line was prescribed.  6 MV X-rays were delivered in the flattening filter free beam mode.  11/02/16-11/15/16: 1.  The right upper lung was treated 30 Gy in 10 fractions of 3 Gy   Narrative:  The patient returns today for routine follin summary this is a pleasant gentleman diagnosed with stage IV lung cancer treated with radiotherapy in the palliative fashion to the right upper lobe, and subsequent to this also received radiation to a left thalamic metastasis in the brain. He is currently hospitalized after a fall which led to a subarachnoid hemorrhage, he is clinically improving, and was scheduled to see me today for follow-up after radiotherapy.                            On review of systems, the patient stathe feels as though he is doing pretty well. He denies any concerns with headaches, blurred vision or double vision. He denies any nausea, or auditory disturbances. No other complaints or verbalized.  ALLERGIES:  is allergic to sulfa antibiotics.  Meds: Current Outpatient Prescriptions  Medication Sig Dispense Refill  . albuterol (PROVENTIL HFA;VENTOLIN HFA) 108 (90 Base) MCG/ACT inhaler Inhale 2 puffs into the lungs every 6 (six) hours as needed for wheezing or shortness of breath. 1 Inhaler 2  . AMBULATORY NON FORMULARY MEDICATION Medication Name: MAGIC MOUTHWASH 2 % Viscous Lidocaine   Maalox Benadryl Susp.  Disp: 1:1:1 21ml bottle Sig: 78ml PO Swish & Spit  q 3-4hrs. PRN mouth sores 200 mL 0  . atorvastatin (LIPITOR) 20 MG tablet Take 1 tablet (20 mg total) by mouth at bedtime. 30 tablet 0  . dexamethasone (DECADRON) 4 MG tablet 4 mg by mouth twice a day the day before, day of and day after the chemotherapy every 3 weeks 40 tablet 1  . docusate sodium (COLACE) 100 MG capsule Take 100 mg by mouth 2 (two) times daily.    Marland Kitchen ELIQUIS 5 MG TABS tablet Take 5 mg by mouth 2 (two) times daily.     . furosemide (LASIX) 40 MG tablet Take 40 mg by mouth daily as needed.    Marland Kitchen guaiFENesin-dextromethorphan (ROBITUSSIN DM) 100-10 MG/5ML syrup Take 5 mLs by mouth every 4 (four) hours as needed for cough. 118 mL 0  . JANUMET 50-1000 MG tablet 1 tablet 2 (two) times daily.    Marland Kitchen LORazepam (ATIVAN) 1 MG tablet Take 0.5 tablets (0.5 mg total) by mouth every 8 (eight) hours as needed for anxiety (30 min before MRI). 15 tablet 0  . losartan (COZAAR) 50 MG tablet Take 50 mg by mouth daily.    . metoprolol tartrate (LOPRESSOR) 25 MG tablet Take 25 mg by mouth 2 (two) times daily.    Marland Kitchen oxybutynin (DITROPAN-XL) 10 MG 24 hr tablet Take 10 mg by mouth at bedtime.    Marland Kitchen  prochlorperazine (COMPAZINE) 10 MG tablet Take 1 tablet (10 mg total) by mouth every 6 (six) hours as needed for nausea or vomiting. 30 tablet 0   No current facility-administered medications for this encounter.     Physical Findings:  vitals were not taken for this visit. In general this is An elderly appearing Caucasian male in no acute distress. He's alert and oriented x4 and appropriate throughout the examination. Cardiopulmonary assessment is negative for acute distress and he exhibits normal effort. Ecchymosis is noted over his right orbital region. No evidence of any lacerations are noted of his face, there are multiple lacerations of his right hand and extremity. These appear to be healing well.  Lab Findings: Lab Results   Component Value Date   WBC 11.2 (H) 06/20/2017   HGB 14.0 06/20/2017   HCT 41.4 06/20/2017   MCV 79.5 06/20/2017   PLT 210 06/20/2017     Radiographic Findings: Dg Lumbar Spine 2-3 Views  Result Date: 06/14/2017 CLINICAL DATA:  Known L2 fracture with persistent pain, initial encounter EXAM: LUMBAR SPINE - 2-3 VIEW COMPARISON:  06/13/2017 FINDINGS: Bilateral renal calculi are again noted. Diffuse aortic calcifications are seen. Mild compression deformity at L2 is noted stable from the prior exam. No new compression deformity is seen. No other focal abnormality is noted. IMPRESSION: Stable L2 compression deformity. Electronically Signed   By: Inez Catalina M.D.   On: 06/14/2017 13:17   Ct Lumbar Spine Wo Contrast  Result Date: 06/13/2017 CLINICAL DATA:  Low back pain for 2 weeks. No known injury. History of lung cancer. EXAM: CT LUMBAR SPINE WITHOUT CONTRAST TECHNIQUE: Multidetector CT imaging of the lumbar spine was performed without intravenous contrast administration. Multiplanar CT image reconstructions were also generated. COMPARISON:  Chest radiographs 01/23/2017, chest CT 09/30/2016 and PET-CT 09/21/2016. FINDINGS: Segmentation: There are 5 lumbar type vertebral bodies. Alignment: There is a minimal degenerative grade 1 anterolisthesis at L5-S1. The alignment is otherwise normal. Vertebrae: Mild superior endplate compression fracture at L2, resulting in approximately 15% loss of vertebral body height. There is no osseous retropulsion, associated soft tissue mass or lytic lesion. No other fractures or suspicious marrow lesions are identified. There are hemangiomas within L2 and L3 vertebral bodies. Paraspinal and other soft tissues: No evidence of paraspinal or epidural hematoma. There is diffuse aortic and branch vessel atherosclerosis. Large bilateral renal calculi and bilateral renal cysts are grossly stable. Disc levels: L1-2: Borderline spinal stenosis due to annular disc bulging and mild  facet hypertrophy. No significant osseous retropulsion. L2-3: Borderline spinal stenosis secondary to disc bulging and facet hypertrophy. No foraminal compromise. L3-4: Mild spinal stenosis due to annular disc bulging, facet and ligamentous hypertrophy. The foramina appear sufficiently patent. L4-5: Mild spinal stenosis due to annular disc bulging, facet and ligamentous hypertrophy. The foramina appear sufficiently patent. L5-S1: Small central disc protrusion and moderate facet hypertrophy. No spinal stenosis or nerve root encroachment. IMPRESSION: 1. Mild superior endplate compression fracture at L2 demonstrates no pathologic features and could be acute/subacute. 2. No evidence of metastatic disease or pathologic fracture. 3. Mild multilevel spondylosis with disc bulging, facet and ligamentous hypertrophy contributing to mild/borderline spinal stenosis at detailed above. 4.  Aortic Atherosclerosis (ICD10-I70.0). 5. Bilateral renal calculi and cysts. Electronically Signed   By: Richardean Sale M.D.   On: 06/13/2017 16:53   Ct Abdomen Pelvis W Contrast  Result Date: 06/20/2017 CLINICAL DATA:  Lower abdominal pain with constipation for 16 days. Lung cancer in 2008 with surgery and chemotherapy. EXAM: CT  ABDOMEN AND PELVIS WITH CONTRAST TECHNIQUE: Multidetector CT imaging of the abdomen and pelvis was performed using the standard protocol following bolus administration of intravenous contrast. CONTRAST:  160mL ISOVUE-300 IOPAMIDOL (ISOVUE-300) INJECTION 61% COMPARISON:  PET 09/21/2016. FINDINGS: Lower chest: Left lower lobe pulmonary nodule measures 8 mm on image 18/series 6 and is felt to be new since the prior motion degraded PET. Pleural-based anterior right lung base lesion measures 2.3 cm on image 2/series 6 versus 1.8 cm on the prior PET. Mild cardiomegaly with multivessel coronary artery atherosclerosis. Bilateral moderate gynecomastia. Small right pleural effusion is similar to on the prior PET. Pleural  metastasis again identified, including laterally at 2.6 cm on image 4/series 4. Compare 1.8 cm on the prior PET. Right cardiophrenic angle 10 mm node is enlarged since the prior. Hepatobiliary: Left hepatic lobe lesions are too small to characterize but favored to represent tiny cysts. Cholecystectomy, without biliary ductal dilatation. Calcification in the region of the pancreatic head measures 8 mm on image 33/series 4 and was present on the prior PET. Pancreas: Heterogeneous fatty replacement within the pancreatic head. No acute pancreatitis. Spleen: Normal in size, without focal abnormality. Adrenals/Urinary Tract: Normal right adrenal gland. New left adrenal nodule at 1.5 cm on image 27/series 4. Larger all ileum renal collecting system stones, greater on the right. Bilateral renal cysts and too small to characterize lesions. No hydronephrosis. Subtle increased density at the left and posterior bladder base on image 73/series 4 is likely related to early contrast excretion. Stomach/Bowel: Normal stomach, without wall thickening. Colonic stool burden suggests constipation. Normal terminal ileum. Appendix is not visualized but there is no evidence of right lower quadrant inflammation. Normal small bowel. Vascular/Lymphatic: Advanced aortic and branch vessel atherosclerosis. No abdominopelvic adenopathy. Reproductive: Mild prostatomegaly. Other: No significant free fluid.  Prior laparotomy. Musculoskeletal: Mild compression deformity at L2. Underlying mild sclerosis. No well-defined osseous metastasis on the prior PET. IMPRESSION: 1.  Possible constipation. 2. Calcification in the region of pancreatic head is indeterminate. Although this could represent a common duct stone, no proximal biliary duct dilatation is seen. Therefore, alternatively, this could represent a parenchymal pancreatic calcification. Consider correlation with bilirubin level. 3. Progressive metastatic disease within the lower right chest with  new left adrenal nodules/metastasis. L2 compression deformity with mild underlying sclerosis. Favor posttraumatic/due to osteopenia. Underlying metastasis cannot be excluded. 4. Bilateral nephrolithiasis without obstructive uropathy. 5. Subtle hyper attenuation in the dependent bladder is favored to represent early contrast excretion. Consider urinalysis to exclude bladder lesion or lesions. 6. Prostatomegaly. 7. Coronary artery atherosclerosis. Aortic Atherosclerosis (ICD10-I70.0). Electronically Signed   By: Abigail Miyamoto M.D.   On: 06/20/2017 16:30    Impression/Plan: 1.  Stage IV, T3, N2,M1a NSCL poorly differentiated adenocarcinoma of the right upper lobe with metastatic disease to the brain. The patient appears to be doing well since completing his radiotherapy despite his current scenario with his subarachnoid hemorrhage.  We discussed the rationale for following him closely with repeat imaging every 3 months, and we will schedule this for March. Our brain navigator will be in touch with him to coordinate this in his follow-up appointment to review the results. He understands to contact our office if he develops any neurologic symptoms or concerns prior to his next visit. 2. Subarachnoid hemorrhage. The patient appears to be clinically recovering doing well, he continues to work with physical therapy and occupational therapy. We will plan to continue to follow this expectantly and he is anticipating going home in the near  future. Is about we will follow up is scheduled fashion outpatient for additional imaging to follow the CNS.   Carola Rhine, PAC

## 2017-07-03 NOTE — Progress Notes (Signed)
See progress note under physician encounter. 

## 2017-07-03 NOTE — Progress Notes (Signed)
Radiation Oncology         [phone]) [phone] ________________________________  Name: [name] MRN: [id number]  Date of service: 07/03/2017  DOB: [birth year]  Reconsultation Note:   CC: Sharilyn Sites, MD  Sharilyn Sites, MD  Diagnosis:   83 y.o. 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    ICD-10-CM   1. 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 Adenocarcinoma of right lung, stage 4 (HCC) C34.11 LORazepam (ATIVAN) 0.5 MG tablet  3. Brain metastasis (HCC) C79.31 LORazepam (ATIVAN) 0.5 MG tablet    Narrative:  The patient returns today for re-evaluation. [name] is a pleasant 83 y.o. 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 He has previously received radiation treatment to the lung as well as SRS to the brain. He developed increasing lower back pain and was seen in the ED on 06/20/17 with concerns on CT imaging for new disease in the L2 vertebral body, left adrenal nodule, and possibly in the lower right chest. He  asked for evaluation in neurosurgery. MRI imaging at Dr. Windy Carina office on 06/27/17 showed an L2 spinal compression fracture concerning for new metastasis with burst fracture features.The patient has, been referred today for discussion of potential radiation treatment options.     On review of systems, the patient reports that he is doing well overall. He denies any chest pain, shortness of breath, cough, fevers, chills, night sweats, unintended weight changes. He denies any or bladder disturbances, and denies abdominal pain, nausea or vomiting. He has constipation due to prolonged pain medication use. He states that he was able to drink milk of magnesia mixture and has been using stool softeners that alleviated his constipation. He reports that he is  not taking any medications due to the constipation and notes that he would rather be in pain versus being constipated. He describes pain in his mid low back that is non radiating. It has become quite severe in the last few weeks. He is nervous about his upcoming MRI of the brain and requests another prescription for Ativan for claustrophobia. He denies numbness, weakness, LE pain, SOB, CP, and any other symptoms.  He denies any new musculoskeletal or joint aches or pains, new skin lesions or concerns. A complete review of systems is obtained and is otherwise negative.  ALLERGIES:  is allergic to sulfa antibiotics.    Meds: Current Outpatient Prescriptions  Medication Sig Dispense Refill  . albuterol (PROVENTIL HFA;VENTOLIN HFA) 108 (90 Base) MCG/ACT inhaler Inhale 2 puffs into the lungs every 6 (six) hours as needed for wheezing or shortness of breath. 1 Inhaler 2  . AMBULATORY NON FORMULARY MEDICATION Medication Name: MAGIC MOUTHWASH 2 % Viscous Lidocaine  Maalox Benadryl Susp.  Disp: 1:1:1 230ml bottle Sig: 51ml PO Swish & Spit  q 3-4hrs. PRN mouth sores (Patient not taking: Reported on 07/06/2017) 200 mL 0  . atorvastatin (LIPITOR) 20 MG tablet Take 1 tablet (20 mg total) by mouth at bedtime. 30 tablet 0  . docusate sodium (COLACE) 100 MG capsule Take 100 mg by mouth 2 (two) times daily.    Marland Kitchen ELIQUIS 5 MG TABS tablet Take 5 mg by mouth 2 (two) times daily.     . furosemide (LASIX) 40 MG tablet Take 40 mg by  mouth daily as needed.    Marland Kitchen guaiFENesin-dextromethorphan (ROBITUSSIN DM) 100-10 MG/5ML syrup Take 5 mLs by mouth every 4 (four) hours as needed for cough. (Patient not taking: Reported on 07/06/2017) 118 mL 0  . JANUMET 50-1000 MG tablet 1 tablet 2 (two) times daily.    Marland Kitchen LORazepam (ATIVAN) 0.5 MG tablet Take 1 tablet (0.5 mg total) by mouth every 8 (eight) hours as needed for anxiety (30 min before MRI). 30 tablet 0  . losartan (COZAAR) 50 MG tablet Take 50 mg by mouth daily.    .  metoprolol tartrate (LOPRESSOR) 25 MG tablet Take 25 mg by mouth 2 (two) times daily.    Marland Kitchen oxybutynin (DITROPAN-XL) 10 MG 24 hr tablet Take 10 mg by mouth at bedtime.    Marland Kitchen dexamethasone (DECADRON) 4 MG tablet 4 mg by mouth twice a day the day before, day of and day after the chemotherapy every 3 weeks (Patient not taking: Reported on 07/03/2017) 40 tablet 1  . oxyCODONE-acetaminophen (PERCOCET/ROXICET) 5-325 MG tablet Take by mouth every 4 (four) hours as needed for severe pain.    . polyethylene glycol (MIRALAX / GLYCOLAX) packet Take 17 g by mouth daily.    . prochlorperazine (COMPAZINE) 10 MG tablet Take 1 tablet (10 mg total) by mouth every 6 (six) hours as needed for nausea or vomiting. (Patient not taking: Reported on 07/03/2017) 30 tablet 0   No current facility-administered medications for this encounter.     Physical Findings: Wt Readings from Last 3 Encounters:  07/06/17 209 lb 8 oz (95 kg)  07/03/17 205 lb 6.4 oz (93.2 kg)  06/20/17 215 lb (97.5 kg)   Temp Readings from Last 3 Encounters:  07/06/17 98.2 F (36.8 C) (Oral)  06/20/17 97.7 F (36.5 C) (Oral)  02/22/17 98 F (36.7 C) (Oral)   BP Readings from Last 3 Encounters:  07/06/17 139/84  07/03/17 (!) 121/53  06/20/17 128/78   Pulse Readings from Last 3 Encounters:  07/06/17 (!) 107  07/03/17 89  06/20/17 75   Pain Assessment Pain Score: 5  (Lumbar spine)/10  In general this is a well appearing elderly caucasian male in no acute distress. He is alert and oriented x4 and appropriate throughout the examination. HEENT reveals that the patient is normocephalic, atraumatic. EOMs are intact. PERRLA. Skin is intact without any evidence of gross lesions. Cardiovascular exam reveals a regular rate and rhythm, no clicks rubs or murmurs are auscultated. Chest is clear to auscultation bilaterally. Lymphatic assessment is performed and does not reveal any adenopathy in the cervical, supraclavicular, axillary, or inguinal chains.  Abdomen has active bowel sounds in all quadrants and is intact. The abdomen is soft, non tender, non distended. Lower extremities are negative for pretibial pitting edema, deep calf tenderness, cyanosis or clubbing.    Lab Findings: Lab Results  Component Value Date   WBC 7.8 07/06/2017   WBC 11.2 (H) 06/20/2017   HGB 13.0 07/06/2017   HCT 37.8 (L) 07/06/2017   PLT 212 07/06/2017    Lab Results  Component Value Date   NA 122 (L) 07/06/2017   K 4.2 07/06/2017   CHLORIDE 88 (L) 07/06/2017   CO2 23 07/06/2017   GLUCOSE 179 (H) 07/06/2017   GLUCOSE 184 (H) 10/30/2012   BUN 12.9 07/06/2017   CREATININE 0.7 07/06/2017   BILITOT 1.28 (H) 07/06/2017   ALKPHOS 124 07/06/2017   AST 16 07/06/2017   ALT 12 07/06/2017   PROT 6.9 07/06/2017   ALBUMIN 3.4 (L)  07/06/2017   CALCIUM 10.8 (H) 07/06/2017   ANIONGAP 11 07/06/2017   ANIONGAP 12 06/20/2017    Radiographic Findings: Dg Lumbar Spine 2-3 Views  Result Date: 06/14/2017 CLINICAL DATA:  Known L2 fracture with persistent pain, initial encounter EXAM: LUMBAR SPINE - 2-3 VIEW COMPARISON:  06/13/2017 FINDINGS: Bilateral renal calculi are again noted. Diffuse aortic calcifications are seen. Mild compression deformity at L2 is noted stable from the prior exam. No new compression deformity is seen. No other focal abnormality is noted. IMPRESSION: Stable L2 compression deformity. Electronically Signed   By: Inez Catalina M.D.   On: 06/14/2017 13:17   Ct Lumbar Spine Wo Contrast  Result Date: 06/13/2017 CLINICAL DATA:  Low back pain for 2 weeks. No known injury. History of lung cancer. EXAM: CT LUMBAR SPINE WITHOUT CONTRAST TECHNIQUE: Multidetector CT imaging of the lumbar spine was performed without intravenous contrast administration. Multiplanar CT image reconstructions were also generated. COMPARISON:  Chest radiographs 01/23/2017, chest CT 09/30/2016 and PET-CT 09/21/2016. FINDINGS: Segmentation: There are 5 lumbar type vertebral bodies.  Alignment: There is a minimal degenerative grade 1 anterolisthesis at L5-S1. The alignment is otherwise normal. Vertebrae: Mild superior endplate compression fracture at L2, resulting in approximately 15% loss of vertebral body height. There is no osseous retropulsion, associated soft tissue mass or lytic lesion. No other fractures or suspicious marrow lesions are identified. There are hemangiomas within L2 and L3 vertebral bodies. Paraspinal and other soft tissues: No evidence of paraspinal or epidural hematoma. There is diffuse aortic and branch vessel atherosclerosis. Large bilateral renal calculi and bilateral renal cysts are grossly stable. Disc levels: L1-2: Borderline spinal stenosis due to annular disc bulging and mild facet hypertrophy. No significant osseous retropulsion. L2-3: Borderline spinal stenosis secondary to disc bulging and facet hypertrophy. No foraminal compromise. L3-4: Mild spinal stenosis due to annular disc bulging, facet and ligamentous hypertrophy. The foramina appear sufficiently patent. L4-5: Mild spinal stenosis due to annular disc bulging, facet and ligamentous hypertrophy. The foramina appear sufficiently patent. L5-S1: Small central disc protrusion and moderate facet hypertrophy. No spinal stenosis or nerve root encroachment. IMPRESSION: 1. Mild superior endplate compression fracture at L2 demonstrates no pathologic features and could be acute/subacute. 2. No evidence of metastatic disease or pathologic fracture. 3. Mild multilevel spondylosis with disc bulging, facet and ligamentous hypertrophy contributing to mild/borderline spinal stenosis at detailed above. 4.  Aortic Atherosclerosis (ICD10-I70.0). 5. Bilateral renal calculi and cysts. Electronically Signed   By: Richardean Sale M.D.   On: 06/13/2017 16:53   Mr Jeri Cos TK Contrast  Result Date: 07/06/2017 CLINICAL DATA:  Lung cancer. SRS to left thalamic metastasis on 11/17/2016. EXAM: MRI HEAD WITHOUT AND WITH CONTRAST  TECHNIQUE: Multiplanar, multiecho pulse sequences of the brain and surrounding structures were obtained without and with intravenous contrast. CONTRAST:  24mL MULTIHANCE GADOBENATE DIMEGLUMINE 529 MG/ML IV SOLN COMPARISON:  02/20/2017 FINDINGS: Brain: There is no evidence of acute infarct, acute intracranial hemorrhage, midline shift, or extra-axial fluid collection. There is mild to moderate cerebral atrophy. Periventricular white matter T2 hyperintensities are stable to slightly increased and nonspecific but compatible with mild chronic small vessel ischemic disease with possible superimposed mild post treatment change. There is an unchanged 4 mm linear focus of intrinsic T1 hyperintensity at the site of the treated left thalamic lesion without significant enhancement. There are 4 new enhancing brain lesions as follows: 14 x 13 mm in the perirolandic region/ postcentral gyrus (series 10, image 131), 11 x 8 mm in the right superior  frontal gyrus (series 10, image 136), 24 x 18 mm in the left temporal lobe (series 10, image 68), and 10 mm in the left parietal lobe (series 10, image 110). There is mild edema associated with the left temporal and right parietal lesions without mass effect. Vascular: Major intracranial vascular flow voids are preserved. Skull and upper cervical spine: No suspicious marrow lesion. Sinuses/Orbits: Bilateral cataract extraction. Overall improved inflammatory sinus disease. Moderately large amount of fluid persists in the right maxillary sinus. Trace fluid and mucosal thickening in the left maxillary sinus. Clear mastoid air cells. Other: None. IMPRESSION: 1. Four new brain metastases. At most mild edema without mass effect. 2. Unchanged treated left thalamic metastasis. Electronically Signed   By: Logan Bores M.D.   On: 07/06/2017 16:31   Ct Abdomen Pelvis W Contrast  Result Date: 06/20/2017 CLINICAL DATA:  Lower abdominal pain with constipation for 16 days. Lung cancer in 2008 with  surgery and chemotherapy. EXAM: CT ABDOMEN AND PELVIS WITH CONTRAST TECHNIQUE: Multidetector CT imaging of the abdomen and pelvis was performed using the standard protocol following bolus administration of intravenous contrast. CONTRAST:  164mL ISOVUE-300 IOPAMIDOL (ISOVUE-300) INJECTION 61% COMPARISON:  PET 09/21/2016. FINDINGS: Lower chest: Left lower lobe pulmonary nodule measures 8 mm on image 18/series 6 and is felt to be new since the prior motion degraded PET. Pleural-based anterior right lung base lesion measures 2.3 cm on image 2/series 6 versus 1.8 cm on the prior PET. Mild cardiomegaly with multivessel coronary artery atherosclerosis. Bilateral moderate gynecomastia. Small right pleural effusion is similar to on the prior PET. Pleural metastasis again identified, including laterally at 2.6 cm on image 4/series 4. Compare 1.8 cm on the prior PET. Right cardiophrenic angle 10 mm node is enlarged since the prior. Hepatobiliary: Left hepatic lobe lesions are too small to characterize but favored to represent tiny cysts. Cholecystectomy, without biliary ductal dilatation. Calcification in the region of the pancreatic head measures 8 mm on image 33/series 4 and was present on the prior PET. Pancreas: Heterogeneous fatty replacement within the pancreatic head. No acute pancreatitis. Spleen: Normal in size, without focal abnormality. Adrenals/Urinary Tract: Normal right adrenal gland. New left adrenal nodule at 1.5 cm on image 27/series 4. Larger all ileum renal collecting system stones, greater on the right. Bilateral renal cysts and too small to characterize lesions. No hydronephrosis. Subtle increased density at the left and posterior bladder base on image 73/series 4 is likely related to early contrast excretion. Stomach/Bowel: Normal stomach, without wall thickening. Colonic stool burden suggests constipation. Normal terminal ileum. Appendix is not visualized but there is no evidence of right lower quadrant  inflammation. Normal small bowel. Vascular/Lymphatic: Advanced aortic and branch vessel atherosclerosis. No abdominopelvic adenopathy. Reproductive: Mild prostatomegaly. Other: No significant free fluid.  Prior laparotomy. Musculoskeletal: Mild compression deformity at L2. Underlying mild sclerosis. No well-defined osseous metastasis on the prior PET. IMPRESSION: 1.  Possible constipation. 2. Calcification in the region of pancreatic head is indeterminate. Although this could represent a common duct stone, no proximal biliary duct dilatation is seen. Therefore, alternatively, this could represent a parenchymal pancreatic calcification. Consider correlation with bilirubin level. 3. Progressive metastatic disease within the lower right chest with new left adrenal nodules/metastasis. L2 compression deformity with mild underlying sclerosis. Favor posttraumatic/due to osteopenia. Underlying metastasis cannot be excluded. 4. Bilateral nephrolithiasis without obstructive uropathy. 5. Subtle hyper attenuation in the dependent bladder is favored to represent early contrast excretion. Consider urinalysis to exclude bladder lesion or lesions. 6. Prostatomegaly. 7.  Coronary artery atherosclerosis. Aortic Atherosclerosis (ICD10-I70.0). Electronically Signed   By: Abigail Miyamoto M.D.   On: 06/20/2017 16:30    Impression/Plan: 1. 83 y.o. 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 We reviewed the findings from his MRI at Dr. Windy Carina office, and discussed the role of spinal stereotactic radiosurgery in the management. We discussed the available radiation techniques, and focused on the details of logistics and delivery. We reviewed the anticipated acute and late sequelae associated with radiation in this setting. The patient would like to proceed with SRS and will be scheduled for CT simulation.  We also  discussed with the pt that he will possibly be placed on steroids prior to radiotherapy treatment to aid with alleviating his lumbar back pain following treatment. We will also notify Dr. Julien Nordmann given this concern of recurrent disease.  2.  Opioid induced constipation. We recommended the pt use miralax to aid in constipation.  3. Claustrophobia. The patient will have a prescription called in for Ativan prior to simulation, MRI, and radiotherapy treatment.   We spent 45 minutes minutes face to face with the patient and more than 50% of that time was spent in counseling and/or coordination of care.     Carola Rhine, PAC    Tyler Pita, MD  [facility] Direct Dial: [phone]  Fax: [phone] [email or web address]  Skype  [email or web address]    This document serves as a record of services personally performed by Shona Simpson, PA-C. It was created on their behalf by [employer], a trained medical scribe. The creation of this record is based on the scribe's personal observations and the provider's statements to them. This document has been checked and approved by the attending provider.

## 2017-07-04 ENCOUNTER — Telehealth: Payer: Self-pay | Admitting: Radiation Oncology

## 2017-07-04 ENCOUNTER — Ambulatory Visit: Payer: Self-pay | Admitting: Urology

## 2017-07-04 NOTE — Telephone Encounter (Signed)
Ativan script found on printer in nursing area. Phoned Christian at [employer]. She confirms the Ativan script was received electronically and filled yesterday. Paper script destroyed.

## 2017-07-05 ENCOUNTER — Telehealth: Payer: Self-pay | Admitting: *Deleted

## 2017-07-05 DIAGNOSIS — C3411 Malignant neoplasm of upper lobe, right bronchus or lung: Secondary | ICD-10-CM

## 2017-07-05 NOTE — Telephone Encounter (Signed)
Oncology Nurse Navigator Documentation  Oncology Nurse Navigator Flowsheets 07/05/2017  Navigator Location [facility]-  Navigator Encounter Type Telephone/I received message from radiation oncology that patient needs to be seen with Dr. Julien Nordmann. I updated Dr. Julien Nordmann.  He stats he or APP can see [name].  I updated Kristen.  She states she can see patient tomorrow. I called [name] and asked him if he would like to be seen with med onc.  I listened as he explained.  He states he will come in to talk about options.  I called his care giver [name] per patient's request.  I updated [name].  [name] thinks chemo may not be a good option due him not tolerating it last time.  Hospice has been involved in his care in the past.  I gave [name] and [name] the appt to be seen tomorrow with APP.  I then called Rad Onc RN and updated her that patient will be seen tomorrow before seeing Dr. Tammi Klippel. Orders for labs per protocol completed.   Telephone Outgoing Call  Treatment Phase Follow-up  Barriers/Navigation Needs Coordination of Care  Interventions Coordination of Care  Coordination of Care Appts  Acuity Level 2  Acuity Level 2 Assistance expediting appointments  Time Spent with Patient 30

## 2017-07-06 ENCOUNTER — Ambulatory Visit (HOSPITAL_BASED_OUTPATIENT_CLINIC_OR_DEPARTMENT_OTHER): Payer: Medicare Other | Admitting: Oncology

## 2017-07-06 ENCOUNTER — Ambulatory Visit
Admission: RE | Admit: 2017-07-06 | Discharge: 2017-07-06 | Disposition: A | Discharge status: Home / Self Care | Payer: Medicare Other | Source: Ambulatory Visit | Attending: Radiation Oncology | Admitting: Radiation Oncology

## 2017-07-06 ENCOUNTER — Telehealth: Payer: Self-pay | Admitting: *Deleted

## 2017-07-06 ENCOUNTER — Other Ambulatory Visit (HOSPITAL_BASED_OUTPATIENT_CLINIC_OR_DEPARTMENT_OTHER): Payer: Medicare Other

## 2017-07-06 ENCOUNTER — Encounter: Payer: Self-pay | Admitting: Oncology

## 2017-07-06 VITALS — BP 139/84 | HR 107 | Temp 98.2°F | Resp 17 | Ht 69.0 in | Wt 209.5 lb

## 2017-07-06 DIAGNOSIS — C3411 Malignant neoplasm of upper lobe, right bronchus or lung: Secondary | ICD-10-CM

## 2017-07-06 DIAGNOSIS — R339 Retention of urine, unspecified: Secondary | ICD-10-CM | POA: Diagnosis not present

## 2017-07-06 DIAGNOSIS — C7931 Secondary malignant neoplasm of brain: Secondary | ICD-10-CM | POA: Diagnosis not present

## 2017-07-06 DIAGNOSIS — R338 Other retention of urine: Secondary | ICD-10-CM | POA: Diagnosis not present

## 2017-07-06 DIAGNOSIS — C7949 Secondary malignant neoplasm of other parts of nervous system: Principal | ICD-10-CM

## 2017-07-06 DIAGNOSIS — N401 Enlarged prostate with lower urinary tract symptoms: Secondary | ICD-10-CM | POA: Diagnosis not present

## 2017-07-06 DIAGNOSIS — C3490 Malignant neoplasm of unspecified part of unspecified bronchus or lung: Secondary | ICD-10-CM | POA: Diagnosis not present

## 2017-07-06 DIAGNOSIS — Z510 Encounter for antineoplastic radiation therapy: Secondary | ICD-10-CM | POA: Diagnosis not present

## 2017-07-06 DIAGNOSIS — C7951 Secondary malignant neoplasm of bone: Secondary | ICD-10-CM

## 2017-07-06 DIAGNOSIS — Z85118 Personal history of other malignant neoplasm of bronchus and lung: Secondary | ICD-10-CM | POA: Diagnosis not present

## 2017-07-06 DIAGNOSIS — Z79899 Other long term (current) drug therapy: Secondary | ICD-10-CM | POA: Diagnosis not present

## 2017-07-06 DIAGNOSIS — Z923 Personal history of irradiation: Secondary | ICD-10-CM | POA: Diagnosis not present

## 2017-07-06 LAB — CBC WITH DIFFERENTIAL/PLATELET
BASO%: 0.2 % (ref 0.0–2.0)
Basophils Absolute: 0 10*3/uL (ref 0.0–0.1)
EOS ABS: 0 10*3/uL (ref 0.0–0.5)
EOS%: 0.2 % (ref 0.0–7.0)
HCT: 37.8 % — ABNORMAL LOW (ref 38.4–49.9)
HEMOGLOBIN: 13 g/dL (ref 13.0–17.1)
LYMPH#: 1.1 10*3/uL (ref 0.9–3.3)
LYMPH%: 14.5 % (ref 14.0–49.0)
MCH: 27 pg — ABNORMAL LOW (ref 27.2–33.4)
MCHC: 34.4 g/dL (ref 32.0–36.0)
MCV: 78.6 fL — ABNORMAL LOW (ref 79.3–98.0)
MONO#: 0.6 10*3/uL (ref 0.1–0.9)
MONO%: 8 % (ref 0.0–14.0)
NEUT#: 6 10*3/uL (ref 1.5–6.5)
NEUT%: 77.1 % — ABNORMAL HIGH (ref 39.0–75.0)
PLATELETS: 212 10*3/uL (ref 140–400)
RBC: 4.81 10*6/uL (ref 4.20–5.82)
RDW: 18.2 % — AB (ref 11.0–14.6)
WBC: 7.8 10*3/uL (ref 4.0–10.3)

## 2017-07-06 LAB — COMPREHENSIVE METABOLIC PANEL
ALBUMIN: 3.4 g/dL — AB (ref 3.5–5.0)
ALT: 12 U/L (ref 0–55)
AST: 16 U/L (ref 5–34)
Alkaline Phosphatase: 124 U/L (ref 40–150)
Anion Gap: 11 mEq/L (ref 3–11)
BUN: 12.9 mg/dL (ref 7.0–26.0)
CALCIUM: 10.8 mg/dL — AB (ref 8.4–10.4)
CHLORIDE: 88 meq/L — AB (ref 98–109)
CO2: 23 mEq/L (ref 22–29)
Creatinine: 0.7 mg/dL (ref 0.7–1.3)
EGFR: 83 mL/min/{1.73_m2} — AB (ref >90)
Glucose: 179 mg/dl — ABNORMAL HIGH (ref 70–140)
POTASSIUM: 4.2 meq/L (ref 3.5–5.1)
Sodium: 122 mEq/L — ABNORMAL LOW (ref 136–145)
TOTAL PROTEIN: 6.9 g/dL (ref 6.4–8.3)
Total Bilirubin: 1.28 mg/dL — ABNORMAL HIGH (ref 0.20–1.20)

## 2017-07-06 MED ORDER — GADOBENATE DIMEGLUMINE 529 MG/ML IV SOLN
19.0000 mL | Freq: Once | INTRAVENOUS | Status: AC | PRN
  Administered 2017-07-06: 19 mL via INTRAVENOUS

## 2017-07-06 NOTE — Telephone Encounter (Signed)
Referral to [facility] faxed with documentation to support referral.

## 2017-07-06 NOTE — Assessment & Plan Note (Addendum)
This is a very pleasant 83 year old white male with metastatic non-small cell lung cancer, adenocarcinoma with solitary brain metastasis status post stereotactic radiotherapy to the solitary brain lesion and the recent MRI of the brain showed improvement in this area. The patient was started on systemic chemotherapy with carboplatin and Alimta but unfortunately he has rough time with this treatment with frequent hospitalization as well as pneumonia and influenza.  He has recently developed back pain and a CT scan showed an isolated metastasis at L2. He is scheduled to undergo SRS under the direction of radiation oncology.  The patient was seen with Dr. Julien Nordmann.  Discussed options including resuming treatment with chemotherapy versus Hospice. He is not interested in any further treatment at this point.  The patient is opted to enroll in hospice.A referral has been made to [facility]  We will see him on as-needed basis at this point.

## 2017-07-06 NOTE — Progress Notes (Signed)
[facility]:    [address], [place] [address]   DIAGNOSIS: Cancer Staging No matching staging information was found for the patient. Stage IV (T3, N2, M1b) poorly differentiated adenocarcinoma diagnosed in November 2017. He was initially diagnosed with a stage IA non-small cell lung cancer, moderately differentiated adenocarcinoma in April 2008. PDL 1 expression 0%.  MOLECULAR STUDIES: Genomic Alterations Identified? BRCA2 R2494* AKT2 amplification - equivocal? TP53 A5f*35 Additional Findings? Microsatellite status MS-Stable Tumor Mutation Burden TMB-Intermediate; 12 Muts/Mb Additional Disease-relevant Genes with No Reportable Alterations Identified? EGFR KRAS ALK BRAF MET RET ERBB2 ROS1  SUMMARY OF ONCOLOGIC HISTORY:   Adenocarcinoma of right lung, stage 4 ([place]   10/13/2016 Initial Diagnosis    Adenocarcinoma of right lung, stage 4 ([place]     04/16/2017 Genetic Testing    BRCA2 c.7480C>T pathogenic variant found on the common hereditary cancer panel.  The Hereditary Gene Panel offered by [facility] includes sequencing and/or deletion duplication testing of the following 46 genes: APC, ATM, AXIN2, BARD1, BMPR1A, BRCA1, BRCA2, BRIP1, CDH1, CDKN2A (p14ARF), CDKN2A (p16INK4a), CHEK2, CTNNA1, DICER1, EPCAM (Deletion/duplication testing only), GREM1 (promoter region deletion/duplication testing only), KIT, MEN1, MLH1, MSH2, MSH3, MSH6, MUTYH, NBN, NF1, NHTL1, PALB2, PDGFRA, PMS2, POLD1, POLE, PTEN, RAD50, RAD51C, RAD51D, SDHB, SDHC, SDHD, SMAD4, SMARCA4. STK11, TP53, TSC1, TSC2, and VHL.  The following genes were evaluated for sequence changes only: SDHA and HOXB13 c.251G>A variant only.  The report date is Apr 16, 2017.       PRIOR THERAPY: 1) status post wedge resection of the right upper lobe with seed implants under the care of Dr. BArlyce Diceon 10/13/2007. 2) palliative radiotherapy to the large right upper lobe lung  mass under the care of Dr. MTammi Klippel 3) stereotactic radiotherapy to a solitary left thalamic brain metastasis on 11/17/2016. 4) Systemic chemotherapy with carboplatin for AUC of 5, Alimta 500 MG/M2 and Avastin 15 MG/KG every 3 weeks. First dose 08/30/2016. Status post 2 cycles. Starting from cycle #2 carboplatin will be for AUC of 4 and Alimta 400 MG/M2. The patient stop chemotherapy due to poor tolerance. He elected to go on observation.  CURRENT THERAPY: The patient is scheduled to undergo SRS to an isolated L2 metastasis. This is scheduled for 07/13/2017.  INTERVAL HISTORY: Andrew HEROUX83y.o. male returns for a work in appointment per the request of radiation oncology. The patient is here at his visit with his neighbor and pastor. We received a message from their office that the patient wanted to come and talk to u[place]about possible treatment. The patient has been having increased back pain and was seen in the emergency room recently. He was found to have a new L2 lesion and is due to have SChicalin the near future. He has been using oxycodone for pain.  The patient's biggest complaint today is urinary retention. He states that he has not voided in about 3 days. His neighbor says that he has dribbled very small amounts of urine in this time. He is having discomfort over his abdomen into his flanks. He is also had some difficulty with opioid-induced constipation. He is using MiraLAX on a daily basis for this. He denies fevers and chills. Denies chest pain, shortness of breath, cough. Denies nausea and vomiting. The patient is scheduled for simulation later this morning and he is due to have an MRI of his brain to follow-up on her brain lesion. He is here to discuss possible treatment options.  Patient Active Problem List   Diagnosis Date Noted  . Urinary retention 07/06/2017  . 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 Genetic testing 04/17/2017  . BRCA2 positive 04/17/2017  . Family history of  breast cancer   . Influenza with respiratory manifestation 12/22/2016  . Dehydration 12/22/2016  . Chronic systolic CHF (congestive heart failure) ([place]) 12/22/2016  . Sepsis ([place]) 12/21/2016  . SAH (subarachnoid hemorrhage) ([place]) 12/19/2016  . Fall 12/19/2016  . Encounter for antineoplastic chemotherapy 12/07/2016  . Bronchitis 11/23/2016  . Peripheral edema 11/23/2016  . Goals of care, counseling/discussion 11/17/2016  . 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 Palliative care by specialist   . DNR (do not resuscitate)   . Adenocarcinoma of right lung, stage 4 ([place]) 10/13/2016  . Shortness of breath at rest 10/13/2016  . Atrial fibrillation with rapid ventricular response ([place]) 08/25/2016  . Chest pain 08/25/2016  . DM type 2 (diabetes mellitus, type 2) ([place]) 08/25/2016  . Aortic atherosclerosis ([place]) 08/25/2016  . Thoracic aortic aneurysm ([place]) 08/25/2016  . Hx of cancer of lung 11/07/2011  . Hypercholesteremia   . History of kidney stones     is allergic to sulfa antibiotics.  MEDICAL HISTORY: Past Medical History:  Diagnosis Date  . Adenocarcinoma of right lung, stage 4 ([place]) 10/13/2016  . Atrial fibrillation ([place])   . Brain metastasis ([place]) 11/03/2016  . Colon polyps   . Encounter for antineoplastic chemotherapy 12/07/2016  . Enlarged prostate   . Essential hypertension   . Family history of breast cancer   . Goals of care, counseling/discussion 11/17/2016  . History of kidney stones   . Hypercholesteremia   . Lung cancer ([place]) Dx'd 01/2007   Stage IA non-small cell lung cancer, moderately differentiated adenocarcinoma - VATS and seed implants  . Thoracic aortic aneurysm ([facility])    4.2 cm in AP diameter   . Type 2 diabetes mellitus ([place])     SURGICAL HISTORY: Past Surgical History:  Procedure Laterality Date  . APPENDECTOMY    . CERVICAL SPINE SURGERY    . CHOLECYSTECTOMY    . COLONOSCOPY  04/17/2012   Procedure: COLONOSCOPY;  Surgeon: Jamesetta So, MD;  Location: [facility];  Service: Gastroenterology;  Laterality: N/A;  . COLONOSCOPY W/ POLYPECTOMY    . TONSILLECTOMY    . wedge resection with seed implantation and node sampling with right VATS  03/13/2007    SOCIAL HISTORY: Social History   Social History  . Marital status: Widowed    Spouse name: N/A  . Number of children: N/A  . Years of education: N/A   Occupational History  . Not on file.   Social History Main Topics  . Smoking status: Former Smoker    Packs/day: 1.50    Years: 15.00    Types: Cigarettes    Quit date: 11/28/1964  . Smokeless tobacco: Never Used  . Alcohol use No  . Drug use: No  . Sexual activity: No   Other Topics Concern  . Not on file   Social History Narrative  . No narrative on file    FAMILY HISTORY: Family History  Problem Relation Age of Onset  . Cancer Mother        Breast  . Cancer Sister        Breast  . Cancer Sister        possible breast cancer  . Colon cancer Neg Hx     Review of Systems  Constitutional: Positive for fatigue. Negative for appetite change, chills, diaphoresis, fever and unexpected weight change.  HENT:  Negative.   Eyes: Negative.   Respiratory: Negative.   Cardiovascular: Negative.   Gastrointestinal: Negative.   Endocrine: Negative.   Genitourinary: Positive for difficulty urinating.        Flank pain bilaterally  Musculoskeletal: Positive for back pain and flank pain. Negative for neck pain and neck stiffness.  Skin: Negative.   Neurological: Negative.   Hematological: Negative.   Psychiatric/Behavioral: Negative.       PHYSICAL EXAMINATION  ECOG PERFORMANCE STATUS: 1 - Symptomatic but completely ambulatory  Vitals:   07/06/17 1255  BP: 139/84  Pulse: (!) 107  Resp: 17  Temp: 98.2 F (36.8 C)  SpO2: 97%    Physical Exam  Constitutional: He is oriented to person, place, and time and well-developed, well-nourished, and in no distress. No distress.  HENT:  Head:  Normocephalic.  Mouth/Throat: Oropharynx is clear and moist. No oropharyngeal exudate.  Eyes: Conjunctivae are normal. No scleral icterus.  Neck: Normal range of motion.  Cardiovascular: Normal rate, regular rhythm and normal heart sounds.   Pulmonary/Chest: Effort normal and breath sounds normal. No respiratory distress. He has no wheezes. He has no rales.  Abdominal: Bowel sounds are normal. He exhibits distension. There is tenderness.  Abdomen over the bladder is distended and he has mild tenderness with palpation.  Musculoskeletal: Normal range of motion. He exhibits no edema.  Lymphadenopathy:    He has no cervical adenopathy.  Neurological: He is alert and oriented to person, place, and time. He exhibits normal muscle tone. Coordination normal.  Skin: Skin is warm and dry. No rash noted. He is not diaphoretic. No erythema. No pallor.  Psychiatric: Mood, affect and judgment normal.  Vitals reviewed.   LABORATORY DATA:  CBC    Component Value Date/Time   WBC 7.8 07/06/2017 0904   WBC 11.2 (H) 06/20/2017 1224   RBC 4.81 07/06/2017 0904   RBC 5.21 06/20/2017 1224   HGB 13.0 07/06/2017 0904   HCT 37.8 (L) 07/06/2017 0904   PLT 212 07/06/2017 0904   MCV 78.6 (L) 07/06/2017 0904   MCH 27.0 (L) 07/06/2017 0904   MCH 26.9 06/20/2017 1224   MCHC 34.4 07/06/2017 0904   MCHC 33.8 06/20/2017 1224   RDW 18.2 (H) 07/06/2017 0904   LYMPHSABS 1.1 07/06/2017 0904   MONOABS 0.6 07/06/2017 0904   EOSABS 0.0 07/06/2017 0904   BASOSABS 0.0 07/06/2017 0904    CMP     Component Value Date/Time   NA 122 (L) 07/06/2017 0905   K 4.2 07/06/2017 0905   CL 88 (L) 06/20/2017 1224   CL 104 10/30/2012 0805   CO2 23 07/06/2017 0905   GLUCOSE 179 (H) 07/06/2017 0905   GLUCOSE 184 (H) 10/30/2012 0805   BUN 12.9 07/06/2017 0905   CREATININE 0.7 07/06/2017 0905   CALCIUM 10.8 (H) 07/06/2017 0905   PROT 6.9 07/06/2017 0905   ALBUMIN 3.4 (L) 07/06/2017 0905   AST 16 07/06/2017 0905   ALT 12  07/06/2017 0905   ALKPHOS 124 07/06/2017 0905   BILITOT 1.28 (H) 07/06/2017 0905   GFRNONAA >60 06/20/2017 1224   GFRAA >60 06/20/2017 1224   RADIOGRAPHIC STUDIES:  No results found.   ASSESSMENT and THERAPY PLAN:   Adenocarcinoma of right lung, stage 4 ([place]) This is a very pleasant 83 year old white male with metastatic non-small cell lung cancer, adenocarcinoma with solitary brain metastasis status post stereotactic  radiotherapy to the solitary brain lesion and the recent MRI of the brain showed improvement in this area. The patient was started on systemic chemotherapy with carboplatin and Alimta but unfortunately he has rough time with this treatment with frequent hospitalization as well as pneumonia and influenza.  He has recently developed back pain and a CT scan showed an isolated metastasis at L2. He is scheduled to undergo SRS under the direction of radiation oncology.  The patient was seen with Dr. Julien Nordmann.  Discussed options including resuming treatment with chemotherapy versus Hospice. He is not interested in any further treatment at this point.  The patient is opted to enroll in hospice.A referral has been made to [facility].  We will see him on as-needed basis at this point.   Urinary retention The patient has urinary retention. He has not voided in 3 days. Recent CT scan does show prostatomegaly. I have called [facility]. The patient will be seen today at 2 PM by Dr. Junious Silk for evaluation of his urinary retention.   No orders of the defined types were placed in this encounter.   All questions were answered. The patient knows to call the clinic with any problems, questions or concerns. We can certainly see the patient much sooner if necessary.  Mikey Bussing, NP 07/06/2017   ADDENDUM: Hematology/Oncology Attending: I had a face to face encounter with the patient. I recommended his care plan. This is a very pleasant 83 years old white  male with metastatic non-small cell lung cancer, adenocarcinoma with solitary brain metastasis status post stereotactic radiotherapy to the brain as well as previous systemic chemotherapy with carboplatin and Alimta discontinued secondary to entrance and the patient declined to continue with any further systemic therapy. He was recently found to have back pain and CT scan showed isolated metastasis at L2 and he is currently undergoing SBRT to this area under the care of Dr. Tammi Klippel. The patient was referred back to the clinic for evaluation and discussion of systemic treatment options. He again indicated no interest in consideration of systemic chemotherapy or immunotherapy. He is very interested in palliative care and hospice at this point. We will make the referral to the hosp[facility] service of [facility]. He has urinary tension and we will refer the patient to urology today for evaluation and recommendation regarding his condition. He was advised to call immediately if he has any concerning symptoms in the interval. Disclaimer: This note was dictated with voice recognition software. Similar sounding words can inadvertently be transcribed and may be missed upon review. Eilleen Kempf., MD 07/08/17

## 2017-07-06 NOTE — Assessment & Plan Note (Signed)
The patient has urinary retention. He has not voided in 3 days. Recent CT scan does show prostatomegaly. I have called [facility]. The patient will be seen today at 2 PM by Dr. Junious Silk for evaluation of his urinary retention.

## 2017-07-06 NOTE — Progress Notes (Signed)
  Radiation Oncology         [phone] ________________________________  Name: [name] MRN: [id number]  Date: 07/06/2017  DOB: [birth year]  SIMULATION AND TREATMENT PLANNING NOTE    ICD-10-CM   1. 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 man with an L2 spinal metastasis with a history of Stage IV, T3 N2 M1a NSCLC, poorly differentiated adenocarcinoma of the right upper lobe, with metastasis to the brain.  NARRATIVE:  The patient was brought to the [facility].  Identity was confirmed.  All relevant records and images related to the planned course of therapy were reviewed.  The patient freely provided informed written consent to proceed with treatment after reviewing the details related to the planned course of therapy. The consent form was witnessed and verified by the simulation staff. Intravenous access was established for contrast administration. Then, the patient was set-up in a stable reproducible supine position for radiation therapy.  A relocatable thermoplastic stereotactic head frame was fabricated for precise immobilization.  CT images were obtained.  Surface markings were placed.  The CT images were loaded into the planning software and fused with the patient's targeting MRI scan.  Then the target and avoidance structures were contoured.  Treatment planning then occurred.  The radiation prescription was entered and confirmed.  I have requested 3D planning  I have requested a DVH of the following structures: Brain stem, brain, left eye, right eye, lenses, optic chiasm, target volumes, uninvolved brain, and normal tissue.    SPECIAL TREATMENT PROCEDURE:  The planned course of therapy using radiation constitutes a special treatment procedure. Special care is required in the management of this patient for the following reasons. This treatment constitutes a Special Treatment Procedure for the following reason: High dose per fraction requiring special  monitoring for increased toxicities of treatment including daily imaging.  The special nature of the planned course of radiotherapy will require increased physician supervision and oversight to ensure patient's safety with optimal treatment outcomes.  PLAN:  The patient will receive 18 Gy in 1 fraction.  ________________________________  Sheral Apley Tammi Klippel, M.D.  This document serves as a record of services personally performed by Tyler Pita, MD. It was created on his behalf by Arlyce Harman, a trained medical scribe. The creation of this record is based on the scribe's personal observations and the provider's statements to them. This document has been checked and approved by the attending provider.

## 2017-07-10 ENCOUNTER — Telehealth: Payer: Self-pay | Admitting: Radiation Therapy

## 2017-07-10 DIAGNOSIS — C7931 Secondary malignant neoplasm of brain: Secondary | ICD-10-CM | POA: Diagnosis not present

## 2017-07-10 DIAGNOSIS — C7951 Secondary malignant neoplasm of bone: Secondary | ICD-10-CM | POA: Insufficient documentation

## 2017-07-10 DIAGNOSIS — C3490 Malignant neoplasm of unspecified part of unspecified bronchus or lung: Secondary | ICD-10-CM | POA: Insufficient documentation

## 2017-07-10 NOTE — Telephone Encounter (Signed)
I spoke with Calib and his close friend, Inocente Salles, about his 07/06/17 Brain MRI results. Unfortunately, this scan does show 4 new brain metastases. Dr. Tammi Klippel has offered a course of salvage SRS to treat these new areas using a single isocenter, multi-target technique. Jabir is already scheduled to be here on Thursday 8/16 to have his L2 treated. He would like to proceed with SRS to both the L2 lesion and the 4 new brain targets to be done the same day.        Sam will help remind Mordechai to take his Ativan prior to his appointment. Both were happy to hear that he will not have to have a separate planning session and that we have his old treatment mask from 10/2016 to use.        Dr. Julien Nordmann has made a Hospice referral to come to the home this week to assess his needs. The plan is for them to come out, but not initiate Hospice until after his treatment has been completed.   Council Mechanic R.T.(R)(T) Special Procedures Navigator Radiation Oncology

## 2017-07-11 ENCOUNTER — Ambulatory Visit
Admission: RE | Admit: 2017-07-11 | Discharge: 2017-07-11 | Disposition: A | Discharge status: Home / Self Care | Payer: Medicare Other | Source: Ambulatory Visit | Attending: Urology | Admitting: Urology

## 2017-07-11 DIAGNOSIS — C3411 Malignant neoplasm of upper lobe, right bronchus or lung: Secondary | ICD-10-CM

## 2017-07-12 DIAGNOSIS — R338 Other retention of urine: Secondary | ICD-10-CM | POA: Diagnosis not present

## 2017-07-12 DIAGNOSIS — C7931 Secondary malignant neoplasm of brain: Secondary | ICD-10-CM | POA: Diagnosis not present

## 2017-07-12 DIAGNOSIS — Z85118 Personal history of other malignant neoplasm of bronchus and lung: Secondary | ICD-10-CM | POA: Diagnosis not present

## 2017-07-12 DIAGNOSIS — Z79899 Other long term (current) drug therapy: Secondary | ICD-10-CM | POA: Diagnosis not present

## 2017-07-12 DIAGNOSIS — Z510 Encounter for antineoplastic radiation therapy: Secondary | ICD-10-CM | POA: Diagnosis not present

## 2017-07-12 DIAGNOSIS — C7951 Secondary malignant neoplasm of bone: Secondary | ICD-10-CM | POA: Diagnosis not present

## 2017-07-12 DIAGNOSIS — Z923 Personal history of irradiation: Secondary | ICD-10-CM | POA: Diagnosis not present

## 2017-07-13 ENCOUNTER — Encounter: Payer: Self-pay | Admitting: Radiation Oncology

## 2017-07-13 ENCOUNTER — Ambulatory Visit
Admission: RE | Admit: 2017-07-13 | Discharge: 2017-07-13 | Disposition: A | Discharge status: Home / Self Care | Payer: Medicare Other | Source: Ambulatory Visit | Attending: Radiation Oncology | Admitting: Radiation Oncology

## 2017-07-13 ENCOUNTER — Ambulatory Visit: Payer: Medicare Other | Admitting: Radiation Oncology

## 2017-07-13 VITALS — BP 160/97 | HR 107 | Temp 97.8°F | Resp 20

## 2017-07-13 DIAGNOSIS — C7949 Secondary malignant neoplasm of other parts of nervous system: Secondary | ICD-10-CM | POA: Diagnosis not present

## 2017-07-13 DIAGNOSIS — Z923 Personal history of irradiation: Secondary | ICD-10-CM | POA: Diagnosis not present

## 2017-07-13 DIAGNOSIS — Z510 Encounter for antineoplastic radiation therapy: Secondary | ICD-10-CM | POA: Diagnosis not present

## 2017-07-13 DIAGNOSIS — C7951 Secondary malignant neoplasm of bone: Secondary | ICD-10-CM | POA: Diagnosis not present

## 2017-07-13 DIAGNOSIS — C7931 Secondary malignant neoplasm of brain: Secondary | ICD-10-CM | POA: Diagnosis not present

## 2017-07-13 DIAGNOSIS — Z85118 Personal history of other malignant neoplasm of bronchus and lung: Secondary | ICD-10-CM | POA: Diagnosis not present

## 2017-07-13 DIAGNOSIS — Z79899 Other long term (current) drug therapy: Secondary | ICD-10-CM | POA: Diagnosis not present

## 2017-07-13 DIAGNOSIS — C3490 Malignant neoplasm of unspecified part of unspecified bronchus or lung: Secondary | ICD-10-CM | POA: Diagnosis not present

## 2017-07-13 MED ORDER — METHYLPREDNISOLONE 4 MG PO TBPK: ORAL_TABLET | ORAL | 0 refills | Status: AC

## 2017-07-13 NOTE — Progress Notes (Signed)
Patient alert and oriented x 3. No distress noted. Denies headache, dizziness, nausea, diplopia or tinnitus. Reports lumbar spine pain 5 on a scale of 0-10. Instructed patient to avoid strenuous activity for the next 24 hours. Instructed patient to call [phone] with needs. Reviewed Medrol dose pack instructions with patient. Patient verbalized understanding. One month follow up appointment card given. Patient distress with family in wheelchair without distress.  BP (!) 160/97   Pulse (!) 107   Temp 97.8 F (36.6 C) (Oral)   Resp 20   SpO2 98%  Wt Readings from Last 3 Encounters:  07/06/17 209 lb 8 oz (95 kg)  07/03/17 205 lb 6.4 oz (93.2 kg)  06/20/17 215 lb (97.5 kg)

## 2017-07-13 NOTE — Progress Notes (Signed)
  Radiation Oncology         [phone] ________________________________  Stereotactic Treatment Procedure Note  Name: [name] MRN: [id number]  Date: 07/13/2017  DOB: 1927-01-29  SPECIAL TREATMENT PROCEDURE    ICD-10-CM   1. 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 Brain metastases ([place]) C79.31     3D TREATMENT PLANNING AND DOSIMETRY:  The patient's radiation plan was reviewed and approved by neurosurgery and radiation oncology prior to treatment.  It showed 3-dimensional radiation distributions overlaid onto the planning CT/MRI image set.  The [facility] for the target structures as well as the organs at risk were reviewed. The documentation of the 3D plan and dosimetry are filed in the radiation oncology EMR.  NARRATIVE:  [name] was brought to the TrueBeam stereotactic radiation treatment machine and placed supine on the CT couch. The head frame was applied, and the patient was set up for stereotactic radiosurgery.  Neurosurgery was present for the set-up and delivery  SIMULATION VERIFICATION:  In the couch zero-angle position, the patient underwent Exactrac imaging using the Brainlab system with orthogonal KV images.  These were carefully aligned and repeated to confirm treatment position for each of the isocenters.  The Exactrac snap film verification was repeated at each couch angle.  PROCEDURE: [name] received stereotactic radiosurgery to the following targets: Comments: 4 brain metastases were treated using 6 Rapid Arc VMAT Beams to a prescription dose of 20 Gy.  ExacTrac registration was performed for each couch angle.  The 100% isodose line was prescribed.  6 MV X-rays were delivered in the flattening filter free beam mode.  STEREOTACTIC TREATMENT MANAGEMENT:  Following delivery, the patient was transported to nursing in stable condition and monitored for possible acute effects.  Vital signs were recorded BP (!) 160/97   Pulse (!) 107   Temp 97.8 F (36.6 C)  (Oral)   Resp 20   SpO2 98% . The patient tolerated treatment without significant acute effects, and was discharged to home in stable condition.    PLAN: Follow-up in one month.  ________________________________  Sheral Apley. Tammi Klippel, M.D.

## 2017-07-13 NOTE — Op Note (Signed)
Stereotactic Radiosurgery Operative Note  Name: [name] MRN: [id number]  Date: 07/13/2017  DOB: October 05, 1927  Op Note  Pre Operative Diagnosis:  Multiple (4) brain metastases from metastatic lung cancer  Post Operative Diagnois:  Multiple (4) brain metastases from metastatic lung cancer  3D TREATMENT PLANNING AND DOSIMETRY:  The patient's radiation plan was reviewed and approved by myself (neurosurgery) and Dr. Ledon Snare (radiation oncology) prior to treatment.  It showed 3-dimensional radiation distributions overlaid onto the planning CT/MRI image set.  The [facility] for the target structures as well as the organs at risk were reviewed. The documentation of the 3D plan and dosimetry are filed in the radiation oncology EMR.  NARRATIVE:  [name] was brought to the TrueBeam stereotactic radiation treatment machine and placed supine on the CT couch. The head frame was applied, and the patient was set up for stereotactic radiosurgery.  I was present for the set-up and delivery.  SIMULATION VERIFICATION:  In the couch zero-angle position, the patient underwent Exactrac imaging using the Brainlab system with orthogonal KV images.  These were carefully aligned and repeated to confirm treatment position for each of the isocenters.  The Exactrac snap film verification was repeated at each couch angle.  SPECIAL TREATMENT PROCEDURE: [name] received stereotactic radiosurgery to the following 4 targets using a single isocenter, multi-target technique: Right post central gyrus (14 mm), right frontal superior gyrus (11 mm), left frontal (10 mm), and left temporal (24 mm) targets were treated using 6 Rapid Arc VMAT Beams to a prescription dose of 20 Gy for each lesion.  ExacTrac registration was performed for each couch angle.  The 100% isodose line was prescribed.  STEREOTACTIC TREATMENT MANAGEMENT:  Following delivery, the patient was transported to nursing in stable condition and monitored  for possible acute effects.  Vital signs were recorded. The patient tolerated treatment without significant acute effects, and was discharged to home in stable condition.    PLAN: Follow-up in one month.

## 2017-07-13 NOTE — Op Note (Signed)
   Name: [name]  MRN: [id number]  Date: 07/13/2017   DOB: 02-08-1927  Stereotactic Radiosurgery Operative Note  PRE-OPERATIVE DIAGNOSIS:  Spinal Metastasis from metastatic lung cancer  POST-OPERATIVE DIAGNOSIS:  Spinal Metastasis from metastatic lung cancer  PROCEDURE:  Stereotactic Radiosurgery  SURGEON:  Hosie Spangle, MD  NARRATIVE: The patient underwent a radiation treatment planning session in the radiation oncology simulation suite under the care of the radiation oncology physician and physicist.  I participated closely in the radiation treatment planning afterwards. The patient underwent planning CT which was fused to the MRI.  These images were fused on the planning system.  Radiation oncology contoured the gross target volume and subsequently expanded this to yield the Planning Target Volume. I actively participated in the planning process.  I helped to define and review the target contours and also the contours of the spinal cord, cauda equina, and selected nearby organs at risk.  All the dose constraints for critical structures were reviewed and compared to AAPM Task Group 101.  The prescription dose conformity was reviewed.  I approved the plan electronically.    Accordingly, [name] was brought to the TrueBeam stereotactic radiation treatment linac and placed in the custom immobilization device.  The patient was aligned according to the IR fiducial markers with BrainLab Exactrac, then orthogonal x-rays were used in ExacTrac with the 6DOF robotic table and the shifts were made to align the patient.  Then conebeam CT was performed to verify precision.  [name] received stereotactic radiosurgery uneventfully.  The detailed description of the procedure is recorded in the radiation oncology procedure note.  I was present for the duration of the procedure.  DISPOSITION:  Following delivery, the patient was transported to nursing in stable condition and monitored  for possible acute effects to be discharged to home in stable condition with follow-up in one month.  Hosie Spangle, MD 07/13/2017 11:30 AM

## 2017-07-13 NOTE — Progress Notes (Signed)
  Radiation Oncology         ([phone]) [phone] ________________________________  Spinal Stereotactic Radiosurgery Procedure Note  Name: ZORION NIMS MRN: [id number]  Date: 07/13/2017  DOB: 1927-03-15  SPECIAL TREATMENT PROCEDURE    ICD-10-CM   1. 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 Brain metastases ([facility]) C79.31     3D TREATMENT PLANNING AND DOSIMETRY:  The patient's radiation plan was reviewed and approved by neurosurgery and radiation oncology prior to treatment.  It showed 3-dimensional radiation distributions overlaid onto the planning CT/MRI image set.  The [facility] for the target structures as well as the organs at risk were reviewed. The documentation of the 3D plan and dosimetry are filed in the radiation oncology EMR.  NARRATIVE:  CRIST KRUSZKA was brought to the TrueBeam stereotactic radiation treatment machine and placed supine on the CT couch. The patient was precisely re-positioned in their BodyFix immobilization device, and the patient was set up for stereotactic radiosurgery.  Neurosurgery was present for the set-up and delivery  SIMULATION VERIFICATION:  In the couch zero-angle position, the patient underwent Exactrac imaging using the Brainlab system with orthogonal KV images to position the target accounting for translation and rotational factors.  These were carefully aligned and repeated to confirm treatment position.  Then, cone beam CT was performed to help further verify placement and make any final translational shifts.  SPECIAL TREATMENT PROCEDURE: PHILIP KOTLYAR received stereotactic radiosurgery to the following targets:  The targeted metastasis in the L2 vertebral body was treated using 3 Rapid Arc VMAT Beams to a prescription dose of 18 Gy to the gross disease (GTV) seen on imaging, while a secondary lower prescription dose of 14 Gy was delivered to the entirety of each directly involved marrow compartment of the gross disease (CTV).    The beams were delivered  with 6 MV X-rays in the flattening filter free mode.  STEREOTACTIC TREATMENT MANAGEMENT:  Following delivery, the patient was transported to nursing in stable condition and monitored for possible acute effects.  Vital signs were recorded BP (!) 160/97   Pulse (!) 107   Temp 97.8 F (36.6 C) (Oral)   Resp 20   SpO2 98% . The patient tolerated treatment without significant acute effects, and was discharged to home in stable condition.    PLAN: Follow-up in one month.  ________________________________  Sheral Apley. Tammi Klippel, M.D.

## 2017-07-14 DIAGNOSIS — I712 Thoracic aortic aneurysm, without rupture: Secondary | ICD-10-CM | POA: Diagnosis not present

## 2017-07-14 DIAGNOSIS — I10 Essential (primary) hypertension: Secondary | ICD-10-CM | POA: Diagnosis not present

## 2017-07-14 DIAGNOSIS — E7800 Pure hypercholesterolemia, unspecified: Secondary | ICD-10-CM | POA: Diagnosis not present

## 2017-07-14 DIAGNOSIS — I5022 Chronic systolic (congestive) heart failure: Secondary | ICD-10-CM | POA: Diagnosis not present

## 2017-07-14 DIAGNOSIS — E119 Type 2 diabetes mellitus without complications: Secondary | ICD-10-CM | POA: Diagnosis not present

## 2017-07-14 DIAGNOSIS — G8929 Other chronic pain: Secondary | ICD-10-CM | POA: Diagnosis not present

## 2017-07-14 DIAGNOSIS — I4891 Unspecified atrial fibrillation: Secondary | ICD-10-CM | POA: Diagnosis not present

## 2017-07-14 DIAGNOSIS — R339 Retention of urine, unspecified: Secondary | ICD-10-CM | POA: Diagnosis not present

## 2017-07-14 DIAGNOSIS — C7931 Secondary malignant neoplasm of brain: Secondary | ICD-10-CM | POA: Diagnosis not present

## 2017-07-14 DIAGNOSIS — C3491 Malignant neoplasm of unspecified part of right bronchus or lung: Secondary | ICD-10-CM | POA: Diagnosis not present

## 2017-07-14 NOTE — Progress Notes (Signed)
  Radiation Oncology         (336) [phone] ________________________________  Name: [name] MRN: [id number]  Date: 07/13/2017  DOB: 1927/09/21  End of Treatment Note  Diagnosis:   83 y.o. 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     Indication for treatment:  Palliative       Radiation treatment dates:   07/13/2017  Site/dose:    1) The L2 vertebral body of the lumbar spine was treated to 18 Gy in 1 fraction of 18 Gy. 2) The four new brain mets were treated to 20 Gy in 1 fraction of 20 Gy.  Beams/energy:    1) Lumbar Spine: SBRT/SRT-VMAT // 10X-FFF Photon 2) Brain: SBRT/SRT-3D // 6X-FFF Photon  Narrative: The patient tolerated radiation treatment relatively well.  His only complaint was lumbar spine pain, which he rated a 5 on a 1-10 scale. He otherwise denied any other symptoms related to his treatment. He was given Medrol and was also instructed to avoid strenuous activity for the following 24 hours after his treatment.   Plan: The patient has completed radiation treatment. The patient will return to radiation oncology clinic for routine followup in one month. I advised him to call or return sooner if he has any questions or concerns related to his recovery or treatment. ________________________________  Sheral Apley. Tammi Klippel, M.D.   This document serves as a record of services personally performed by Tyler Pita, MD. It was created on his behalf by Rae Lips, a trained medical scribe. The creation of this record is based on the scribe's personal observations and the provider's statements to them. This document has been checked and approved by the attending provider.

## 2017-07-17 DIAGNOSIS — E119 Type 2 diabetes mellitus without complications: Secondary | ICD-10-CM | POA: Diagnosis not present

## 2017-07-17 DIAGNOSIS — C7931 Secondary malignant neoplasm of brain: Secondary | ICD-10-CM | POA: Diagnosis not present

## 2017-07-17 DIAGNOSIS — E7800 Pure hypercholesterolemia, unspecified: Secondary | ICD-10-CM | POA: Diagnosis not present

## 2017-07-17 DIAGNOSIS — C3491 Malignant neoplasm of unspecified part of right bronchus or lung: Secondary | ICD-10-CM | POA: Diagnosis not present

## 2017-07-17 DIAGNOSIS — I10 Essential (primary) hypertension: Secondary | ICD-10-CM | POA: Diagnosis not present

## 2017-07-17 DIAGNOSIS — G8929 Other chronic pain: Secondary | ICD-10-CM | POA: Diagnosis not present

## 2017-07-19 DIAGNOSIS — G8929 Other chronic pain: Secondary | ICD-10-CM | POA: Diagnosis not present

## 2017-07-19 DIAGNOSIS — C3491 Malignant neoplasm of unspecified part of right bronchus or lung: Secondary | ICD-10-CM | POA: Diagnosis not present

## 2017-07-19 DIAGNOSIS — E119 Type 2 diabetes mellitus without complications: Secondary | ICD-10-CM | POA: Diagnosis not present

## 2017-07-19 DIAGNOSIS — C7931 Secondary malignant neoplasm of brain: Secondary | ICD-10-CM | POA: Diagnosis not present

## 2017-07-19 DIAGNOSIS — I10 Essential (primary) hypertension: Secondary | ICD-10-CM | POA: Diagnosis not present

## 2017-07-19 DIAGNOSIS — E7800 Pure hypercholesterolemia, unspecified: Secondary | ICD-10-CM | POA: Diagnosis not present

## 2017-07-20 DIAGNOSIS — C7931 Secondary malignant neoplasm of brain: Secondary | ICD-10-CM | POA: Diagnosis not present

## 2017-07-20 DIAGNOSIS — G8929 Other chronic pain: Secondary | ICD-10-CM | POA: Diagnosis not present

## 2017-07-20 DIAGNOSIS — C3491 Malignant neoplasm of unspecified part of right bronchus or lung: Secondary | ICD-10-CM | POA: Diagnosis not present

## 2017-07-20 DIAGNOSIS — E119 Type 2 diabetes mellitus without complications: Secondary | ICD-10-CM | POA: Diagnosis not present

## 2017-07-20 DIAGNOSIS — I10 Essential (primary) hypertension: Secondary | ICD-10-CM | POA: Diagnosis not present

## 2017-07-20 DIAGNOSIS — E7800 Pure hypercholesterolemia, unspecified: Secondary | ICD-10-CM | POA: Diagnosis not present

## 2017-07-21 DIAGNOSIS — C3491 Malignant neoplasm of unspecified part of right bronchus or lung: Secondary | ICD-10-CM | POA: Diagnosis not present

## 2017-07-21 DIAGNOSIS — G8929 Other chronic pain: Secondary | ICD-10-CM | POA: Diagnosis not present

## 2017-07-21 DIAGNOSIS — E119 Type 2 diabetes mellitus without complications: Secondary | ICD-10-CM | POA: Diagnosis not present

## 2017-07-21 DIAGNOSIS — I10 Essential (primary) hypertension: Secondary | ICD-10-CM | POA: Diagnosis not present

## 2017-07-21 DIAGNOSIS — C7931 Secondary malignant neoplasm of brain: Secondary | ICD-10-CM | POA: Diagnosis not present

## 2017-07-21 DIAGNOSIS — E7800 Pure hypercholesterolemia, unspecified: Secondary | ICD-10-CM | POA: Diagnosis not present

## 2017-07-24 DIAGNOSIS — C3491 Malignant neoplasm of unspecified part of right bronchus or lung: Secondary | ICD-10-CM | POA: Diagnosis not present

## 2017-07-24 DIAGNOSIS — E119 Type 2 diabetes mellitus without complications: Secondary | ICD-10-CM | POA: Diagnosis not present

## 2017-07-24 DIAGNOSIS — I10 Essential (primary) hypertension: Secondary | ICD-10-CM | POA: Diagnosis not present

## 2017-07-24 DIAGNOSIS — G8929 Other chronic pain: Secondary | ICD-10-CM | POA: Diagnosis not present

## 2017-07-24 DIAGNOSIS — E7800 Pure hypercholesterolemia, unspecified: Secondary | ICD-10-CM | POA: Diagnosis not present

## 2017-07-24 DIAGNOSIS — C7931 Secondary malignant neoplasm of brain: Secondary | ICD-10-CM | POA: Diagnosis not present

## 2017-07-25 DIAGNOSIS — C7931 Secondary malignant neoplasm of brain: Secondary | ICD-10-CM | POA: Diagnosis not present

## 2017-07-25 DIAGNOSIS — C3491 Malignant neoplasm of unspecified part of right bronchus or lung: Secondary | ICD-10-CM | POA: Diagnosis not present

## 2017-07-25 DIAGNOSIS — E119 Type 2 diabetes mellitus without complications: Secondary | ICD-10-CM | POA: Diagnosis not present

## 2017-07-25 DIAGNOSIS — I10 Essential (primary) hypertension: Secondary | ICD-10-CM | POA: Diagnosis not present

## 2017-07-25 DIAGNOSIS — G8929 Other chronic pain: Secondary | ICD-10-CM | POA: Diagnosis not present

## 2017-07-25 DIAGNOSIS — E7800 Pure hypercholesterolemia, unspecified: Secondary | ICD-10-CM | POA: Diagnosis not present

## 2017-07-26 DIAGNOSIS — I10 Essential (primary) hypertension: Secondary | ICD-10-CM | POA: Diagnosis not present

## 2017-07-26 DIAGNOSIS — G8929 Other chronic pain: Secondary | ICD-10-CM | POA: Diagnosis not present

## 2017-07-26 DIAGNOSIS — E119 Type 2 diabetes mellitus without complications: Secondary | ICD-10-CM | POA: Diagnosis not present

## 2017-07-26 DIAGNOSIS — C7931 Secondary malignant neoplasm of brain: Secondary | ICD-10-CM | POA: Diagnosis not present

## 2017-07-26 DIAGNOSIS — E7800 Pure hypercholesterolemia, unspecified: Secondary | ICD-10-CM | POA: Diagnosis not present

## 2017-07-26 DIAGNOSIS — C3491 Malignant neoplasm of unspecified part of right bronchus or lung: Secondary | ICD-10-CM | POA: Diagnosis not present

## 2017-07-28 DIAGNOSIS — C7931 Secondary malignant neoplasm of brain: Secondary | ICD-10-CM | POA: Diagnosis not present

## 2017-07-28 DIAGNOSIS — I10 Essential (primary) hypertension: Secondary | ICD-10-CM | POA: Diagnosis not present

## 2017-07-28 DIAGNOSIS — E7800 Pure hypercholesterolemia, unspecified: Secondary | ICD-10-CM | POA: Diagnosis not present

## 2017-07-28 DIAGNOSIS — C3491 Malignant neoplasm of unspecified part of right bronchus or lung: Secondary | ICD-10-CM | POA: Diagnosis not present

## 2017-07-28 DIAGNOSIS — G8929 Other chronic pain: Secondary | ICD-10-CM | POA: Diagnosis not present

## 2017-07-28 DIAGNOSIS — E119 Type 2 diabetes mellitus without complications: Secondary | ICD-10-CM | POA: Diagnosis not present

## 2017-07-29 DIAGNOSIS — C7931 Secondary malignant neoplasm of brain: Secondary | ICD-10-CM | POA: Diagnosis not present

## 2017-07-29 DIAGNOSIS — G8929 Other chronic pain: Secondary | ICD-10-CM | POA: Diagnosis not present

## 2017-07-29 DIAGNOSIS — R339 Retention of urine, unspecified: Secondary | ICD-10-CM | POA: Diagnosis not present

## 2017-07-29 DIAGNOSIS — C3491 Malignant neoplasm of unspecified part of right bronchus or lung: Secondary | ICD-10-CM | POA: Diagnosis not present

## 2017-07-29 DIAGNOSIS — I5022 Chronic systolic (congestive) heart failure: Secondary | ICD-10-CM | POA: Diagnosis not present

## 2017-07-29 DIAGNOSIS — I712 Thoracic aortic aneurysm, without rupture: Secondary | ICD-10-CM | POA: Diagnosis not present

## 2017-07-29 DIAGNOSIS — I4891 Unspecified atrial fibrillation: Secondary | ICD-10-CM | POA: Diagnosis not present

## 2017-07-29 DIAGNOSIS — I10 Essential (primary) hypertension: Secondary | ICD-10-CM | POA: Diagnosis not present

## 2017-07-29 DIAGNOSIS — E7800 Pure hypercholesterolemia, unspecified: Secondary | ICD-10-CM | POA: Diagnosis not present

## 2017-07-29 DIAGNOSIS — E119 Type 2 diabetes mellitus without complications: Secondary | ICD-10-CM | POA: Diagnosis not present

## 2017-08-02 DIAGNOSIS — G8929 Other chronic pain: Secondary | ICD-10-CM | POA: Diagnosis not present

## 2017-08-02 DIAGNOSIS — E119 Type 2 diabetes mellitus without complications: Secondary | ICD-10-CM | POA: Diagnosis not present

## 2017-08-02 DIAGNOSIS — I10 Essential (primary) hypertension: Secondary | ICD-10-CM | POA: Diagnosis not present

## 2017-08-02 DIAGNOSIS — C7931 Secondary malignant neoplasm of brain: Secondary | ICD-10-CM | POA: Diagnosis not present

## 2017-08-02 DIAGNOSIS — E7800 Pure hypercholesterolemia, unspecified: Secondary | ICD-10-CM | POA: Diagnosis not present

## 2017-08-02 DIAGNOSIS — C3491 Malignant neoplasm of unspecified part of right bronchus or lung: Secondary | ICD-10-CM | POA: Diagnosis not present

## 2017-08-04 DIAGNOSIS — C3491 Malignant neoplasm of unspecified part of right bronchus or lung: Secondary | ICD-10-CM | POA: Diagnosis not present

## 2017-08-04 DIAGNOSIS — G8929 Other chronic pain: Secondary | ICD-10-CM | POA: Diagnosis not present

## 2017-08-04 DIAGNOSIS — C7931 Secondary malignant neoplasm of brain: Secondary | ICD-10-CM | POA: Diagnosis not present

## 2017-08-04 DIAGNOSIS — E119 Type 2 diabetes mellitus without complications: Secondary | ICD-10-CM | POA: Diagnosis not present

## 2017-08-04 DIAGNOSIS — E7800 Pure hypercholesterolemia, unspecified: Secondary | ICD-10-CM | POA: Diagnosis not present

## 2017-08-04 DIAGNOSIS — I10 Essential (primary) hypertension: Secondary | ICD-10-CM | POA: Diagnosis not present

## 2017-08-07 DIAGNOSIS — I10 Essential (primary) hypertension: Secondary | ICD-10-CM | POA: Diagnosis not present

## 2017-08-07 DIAGNOSIS — C7931 Secondary malignant neoplasm of brain: Secondary | ICD-10-CM | POA: Diagnosis not present

## 2017-08-07 DIAGNOSIS — E119 Type 2 diabetes mellitus without complications: Secondary | ICD-10-CM | POA: Diagnosis not present

## 2017-08-07 DIAGNOSIS — C3491 Malignant neoplasm of unspecified part of right bronchus or lung: Secondary | ICD-10-CM | POA: Diagnosis not present

## 2017-08-07 DIAGNOSIS — E7800 Pure hypercholesterolemia, unspecified: Secondary | ICD-10-CM | POA: Diagnosis not present

## 2017-08-07 DIAGNOSIS — G8929 Other chronic pain: Secondary | ICD-10-CM | POA: Diagnosis not present

## 2017-08-08 DIAGNOSIS — E7800 Pure hypercholesterolemia, unspecified: Secondary | ICD-10-CM | POA: Diagnosis not present

## 2017-08-08 DIAGNOSIS — E119 Type 2 diabetes mellitus without complications: Secondary | ICD-10-CM | POA: Diagnosis not present

## 2017-08-08 DIAGNOSIS — C3491 Malignant neoplasm of unspecified part of right bronchus or lung: Secondary | ICD-10-CM | POA: Diagnosis not present

## 2017-08-08 DIAGNOSIS — I10 Essential (primary) hypertension: Secondary | ICD-10-CM | POA: Diagnosis not present

## 2017-08-08 DIAGNOSIS — G8929 Other chronic pain: Secondary | ICD-10-CM | POA: Diagnosis not present

## 2017-08-08 DIAGNOSIS — C7931 Secondary malignant neoplasm of brain: Secondary | ICD-10-CM | POA: Diagnosis not present

## 2017-08-09 DIAGNOSIS — G8929 Other chronic pain: Secondary | ICD-10-CM | POA: Diagnosis not present

## 2017-08-09 DIAGNOSIS — C3491 Malignant neoplasm of unspecified part of right bronchus or lung: Secondary | ICD-10-CM | POA: Diagnosis not present

## 2017-08-09 DIAGNOSIS — E119 Type 2 diabetes mellitus without complications: Secondary | ICD-10-CM | POA: Diagnosis not present

## 2017-08-09 DIAGNOSIS — E7800 Pure hypercholesterolemia, unspecified: Secondary | ICD-10-CM | POA: Diagnosis not present

## 2017-08-09 DIAGNOSIS — I10 Essential (primary) hypertension: Secondary | ICD-10-CM | POA: Diagnosis not present

## 2017-08-09 DIAGNOSIS — C7931 Secondary malignant neoplasm of brain: Secondary | ICD-10-CM | POA: Diagnosis not present

## 2017-08-10 DIAGNOSIS — I10 Essential (primary) hypertension: Secondary | ICD-10-CM | POA: Diagnosis not present

## 2017-08-10 DIAGNOSIS — C3491 Malignant neoplasm of unspecified part of right bronchus or lung: Secondary | ICD-10-CM | POA: Diagnosis not present

## 2017-08-10 DIAGNOSIS — E7800 Pure hypercholesterolemia, unspecified: Secondary | ICD-10-CM | POA: Diagnosis not present

## 2017-08-10 DIAGNOSIS — E119 Type 2 diabetes mellitus without complications: Secondary | ICD-10-CM | POA: Diagnosis not present

## 2017-08-10 DIAGNOSIS — G8929 Other chronic pain: Secondary | ICD-10-CM | POA: Diagnosis not present

## 2017-08-10 DIAGNOSIS — C7931 Secondary malignant neoplasm of brain: Secondary | ICD-10-CM | POA: Diagnosis not present

## 2017-08-11 DIAGNOSIS — C7931 Secondary malignant neoplasm of brain: Secondary | ICD-10-CM | POA: Diagnosis not present

## 2017-08-11 DIAGNOSIS — E7800 Pure hypercholesterolemia, unspecified: Secondary | ICD-10-CM | POA: Diagnosis not present

## 2017-08-11 DIAGNOSIS — C3491 Malignant neoplasm of unspecified part of right bronchus or lung: Secondary | ICD-10-CM | POA: Diagnosis not present

## 2017-08-11 DIAGNOSIS — E119 Type 2 diabetes mellitus without complications: Secondary | ICD-10-CM | POA: Diagnosis not present

## 2017-08-11 DIAGNOSIS — G8929 Other chronic pain: Secondary | ICD-10-CM | POA: Diagnosis not present

## 2017-08-11 DIAGNOSIS — I10 Essential (primary) hypertension: Secondary | ICD-10-CM | POA: Diagnosis not present

## 2017-08-14 DIAGNOSIS — E7800 Pure hypercholesterolemia, unspecified: Secondary | ICD-10-CM | POA: Diagnosis not present

## 2017-08-14 DIAGNOSIS — C3491 Malignant neoplasm of unspecified part of right bronchus or lung: Secondary | ICD-10-CM | POA: Diagnosis not present

## 2017-08-14 DIAGNOSIS — G8929 Other chronic pain: Secondary | ICD-10-CM | POA: Diagnosis not present

## 2017-08-14 DIAGNOSIS — I10 Essential (primary) hypertension: Secondary | ICD-10-CM | POA: Diagnosis not present

## 2017-08-14 DIAGNOSIS — C7931 Secondary malignant neoplasm of brain: Secondary | ICD-10-CM | POA: Diagnosis not present

## 2017-08-14 DIAGNOSIS — E119 Type 2 diabetes mellitus without complications: Secondary | ICD-10-CM | POA: Diagnosis not present

## 2017-08-15 DIAGNOSIS — G8929 Other chronic pain: Secondary | ICD-10-CM | POA: Diagnosis not present

## 2017-08-15 DIAGNOSIS — I10 Essential (primary) hypertension: Secondary | ICD-10-CM | POA: Diagnosis not present

## 2017-08-15 DIAGNOSIS — E7800 Pure hypercholesterolemia, unspecified: Secondary | ICD-10-CM | POA: Diagnosis not present

## 2017-08-15 DIAGNOSIS — C7931 Secondary malignant neoplasm of brain: Secondary | ICD-10-CM | POA: Diagnosis not present

## 2017-08-15 DIAGNOSIS — E119 Type 2 diabetes mellitus without complications: Secondary | ICD-10-CM | POA: Diagnosis not present

## 2017-08-15 DIAGNOSIS — C3491 Malignant neoplasm of unspecified part of right bronchus or lung: Secondary | ICD-10-CM | POA: Diagnosis not present

## 2017-08-16 ENCOUNTER — Inpatient Hospital Stay
Admission: RE | Admit: 2017-08-16 | Discharge: 2017-08-16 | Disposition: A | Discharge status: Home / Self Care | Payer: Self-pay | Source: Ambulatory Visit | Attending: Urology | Admitting: Urology

## 2017-08-16 DIAGNOSIS — I10 Essential (primary) hypertension: Secondary | ICD-10-CM | POA: Diagnosis not present

## 2017-08-16 DIAGNOSIS — G8929 Other chronic pain: Secondary | ICD-10-CM | POA: Diagnosis not present

## 2017-08-16 DIAGNOSIS — E119 Type 2 diabetes mellitus without complications: Secondary | ICD-10-CM | POA: Diagnosis not present

## 2017-08-16 DIAGNOSIS — E7800 Pure hypercholesterolemia, unspecified: Secondary | ICD-10-CM | POA: Diagnosis not present

## 2017-08-16 DIAGNOSIS — C7931 Secondary malignant neoplasm of brain: Secondary | ICD-10-CM | POA: Diagnosis not present

## 2017-08-16 DIAGNOSIS — C3491 Malignant neoplasm of unspecified part of right bronchus or lung: Secondary | ICD-10-CM | POA: Diagnosis not present

## 2017-08-18 ENCOUNTER — Telehealth (HOSPITAL_COMMUNITY): Payer: Self-pay | Admitting: Family Medicine

## 2017-08-18 DIAGNOSIS — I10 Essential (primary) hypertension: Secondary | ICD-10-CM | POA: Diagnosis not present

## 2017-08-18 DIAGNOSIS — E119 Type 2 diabetes mellitus without complications: Secondary | ICD-10-CM | POA: Diagnosis not present

## 2017-08-18 DIAGNOSIS — C7931 Secondary malignant neoplasm of brain: Secondary | ICD-10-CM | POA: Diagnosis not present

## 2017-08-18 DIAGNOSIS — C3491 Malignant neoplasm of unspecified part of right bronchus or lung: Secondary | ICD-10-CM | POA: Diagnosis not present

## 2017-08-18 DIAGNOSIS — E7800 Pure hypercholesterolemia, unspecified: Secondary | ICD-10-CM | POA: Diagnosis not present

## 2017-08-18 DIAGNOSIS — G8929 Other chronic pain: Secondary | ICD-10-CM | POA: Diagnosis not present

## 2017-08-18 NOTE — Telephone Encounter (Signed)
08/18/17 I spoke to patient' son and he was confused.  He said that a [facility] nurse would be coming in and they had already been told that wound care wouldn't be done.  I left a message at [facility] for the Referral coordinator to call back and let us know if we are to schedule him or not.

## 2017-08-21 DIAGNOSIS — E119 Type 2 diabetes mellitus without complications: Secondary | ICD-10-CM | POA: Diagnosis not present

## 2017-08-21 DIAGNOSIS — C7931 Secondary malignant neoplasm of brain: Secondary | ICD-10-CM | POA: Diagnosis not present

## 2017-08-21 DIAGNOSIS — C3491 Malignant neoplasm of unspecified part of right bronchus or lung: Secondary | ICD-10-CM | POA: Diagnosis not present

## 2017-08-21 DIAGNOSIS — E7800 Pure hypercholesterolemia, unspecified: Secondary | ICD-10-CM | POA: Diagnosis not present

## 2017-08-21 DIAGNOSIS — I10 Essential (primary) hypertension: Secondary | ICD-10-CM | POA: Diagnosis not present

## 2017-08-21 DIAGNOSIS — G8929 Other chronic pain: Secondary | ICD-10-CM | POA: Diagnosis not present

## 2017-08-22 ENCOUNTER — Ambulatory Visit: Payer: Medicare Other | Admitting: Cardiology

## 2017-08-22 DIAGNOSIS — G8929 Other chronic pain: Secondary | ICD-10-CM | POA: Diagnosis not present

## 2017-08-22 DIAGNOSIS — E119 Type 2 diabetes mellitus without complications: Secondary | ICD-10-CM | POA: Diagnosis not present

## 2017-08-22 DIAGNOSIS — C7931 Secondary malignant neoplasm of brain: Secondary | ICD-10-CM | POA: Diagnosis not present

## 2017-08-22 DIAGNOSIS — I10 Essential (primary) hypertension: Secondary | ICD-10-CM | POA: Diagnosis not present

## 2017-08-22 DIAGNOSIS — E7800 Pure hypercholesterolemia, unspecified: Secondary | ICD-10-CM | POA: Diagnosis not present

## 2017-08-22 DIAGNOSIS — C3491 Malignant neoplasm of unspecified part of right bronchus or lung: Secondary | ICD-10-CM | POA: Diagnosis not present

## 2017-08-23 DIAGNOSIS — C3491 Malignant neoplasm of unspecified part of right bronchus or lung: Secondary | ICD-10-CM | POA: Diagnosis not present

## 2017-08-23 DIAGNOSIS — E7800 Pure hypercholesterolemia, unspecified: Secondary | ICD-10-CM | POA: Diagnosis not present

## 2017-08-23 DIAGNOSIS — E119 Type 2 diabetes mellitus without complications: Secondary | ICD-10-CM | POA: Diagnosis not present

## 2017-08-23 DIAGNOSIS — C7931 Secondary malignant neoplasm of brain: Secondary | ICD-10-CM | POA: Diagnosis not present

## 2017-08-23 DIAGNOSIS — G8929 Other chronic pain: Secondary | ICD-10-CM | POA: Diagnosis not present

## 2017-08-23 DIAGNOSIS — I10 Essential (primary) hypertension: Secondary | ICD-10-CM | POA: Diagnosis not present

## 2017-08-25 DIAGNOSIS — C3491 Malignant neoplasm of unspecified part of right bronchus or lung: Secondary | ICD-10-CM | POA: Diagnosis not present

## 2017-08-25 DIAGNOSIS — E7800 Pure hypercholesterolemia, unspecified: Secondary | ICD-10-CM | POA: Diagnosis not present

## 2017-08-25 DIAGNOSIS — E119 Type 2 diabetes mellitus without complications: Secondary | ICD-10-CM | POA: Diagnosis not present

## 2017-08-25 DIAGNOSIS — G8929 Other chronic pain: Secondary | ICD-10-CM | POA: Diagnosis not present

## 2017-08-25 DIAGNOSIS — C7931 Secondary malignant neoplasm of brain: Secondary | ICD-10-CM | POA: Diagnosis not present

## 2017-08-25 DIAGNOSIS — I10 Essential (primary) hypertension: Secondary | ICD-10-CM | POA: Diagnosis not present

## 2017-08-28 DIAGNOSIS — E7800 Pure hypercholesterolemia, unspecified: Secondary | ICD-10-CM | POA: Diagnosis not present

## 2017-08-28 DIAGNOSIS — C3491 Malignant neoplasm of unspecified part of right bronchus or lung: Secondary | ICD-10-CM | POA: Diagnosis not present

## 2017-08-28 DIAGNOSIS — E119 Type 2 diabetes mellitus without complications: Secondary | ICD-10-CM | POA: Diagnosis not present

## 2017-08-28 DIAGNOSIS — R339 Retention of urine, unspecified: Secondary | ICD-10-CM | POA: Diagnosis not present

## 2017-08-28 DIAGNOSIS — G8929 Other chronic pain: Secondary | ICD-10-CM | POA: Diagnosis not present

## 2017-08-28 DIAGNOSIS — I4891 Unspecified atrial fibrillation: Secondary | ICD-10-CM | POA: Diagnosis not present

## 2017-08-28 DIAGNOSIS — I10 Essential (primary) hypertension: Secondary | ICD-10-CM | POA: Diagnosis not present

## 2017-08-28 DIAGNOSIS — I5022 Chronic systolic (congestive) heart failure: Secondary | ICD-10-CM | POA: Diagnosis not present

## 2017-08-28 DIAGNOSIS — C7931 Secondary malignant neoplasm of brain: Secondary | ICD-10-CM | POA: Diagnosis not present

## 2017-08-28 DIAGNOSIS — I712 Thoracic aortic aneurysm, without rupture: Secondary | ICD-10-CM | POA: Diagnosis not present

## 2017-08-29 DIAGNOSIS — C7931 Secondary malignant neoplasm of brain: Secondary | ICD-10-CM | POA: Diagnosis not present

## 2017-08-29 DIAGNOSIS — I10 Essential (primary) hypertension: Secondary | ICD-10-CM | POA: Diagnosis not present

## 2017-08-29 DIAGNOSIS — C3491 Malignant neoplasm of unspecified part of right bronchus or lung: Secondary | ICD-10-CM | POA: Diagnosis not present

## 2017-08-29 DIAGNOSIS — E119 Type 2 diabetes mellitus without complications: Secondary | ICD-10-CM | POA: Diagnosis not present

## 2017-08-29 DIAGNOSIS — G8929 Other chronic pain: Secondary | ICD-10-CM | POA: Diagnosis not present

## 2017-08-29 DIAGNOSIS — E7800 Pure hypercholesterolemia, unspecified: Secondary | ICD-10-CM | POA: Diagnosis not present

## 2017-08-30 DIAGNOSIS — I10 Essential (primary) hypertension: Secondary | ICD-10-CM | POA: Diagnosis not present

## 2017-08-30 DIAGNOSIS — G8929 Other chronic pain: Secondary | ICD-10-CM | POA: Diagnosis not present

## 2017-08-30 DIAGNOSIS — C7931 Secondary malignant neoplasm of brain: Secondary | ICD-10-CM | POA: Diagnosis not present

## 2017-08-30 DIAGNOSIS — C3491 Malignant neoplasm of unspecified part of right bronchus or lung: Secondary | ICD-10-CM | POA: Diagnosis not present

## 2017-08-30 DIAGNOSIS — E7800 Pure hypercholesterolemia, unspecified: Secondary | ICD-10-CM | POA: Diagnosis not present

## 2017-08-30 DIAGNOSIS — E119 Type 2 diabetes mellitus without complications: Secondary | ICD-10-CM | POA: Diagnosis not present

## 2017-09-01 DIAGNOSIS — E7800 Pure hypercholesterolemia, unspecified: Secondary | ICD-10-CM | POA: Diagnosis not present

## 2017-09-01 DIAGNOSIS — E119 Type 2 diabetes mellitus without complications: Secondary | ICD-10-CM | POA: Diagnosis not present

## 2017-09-01 DIAGNOSIS — C3491 Malignant neoplasm of unspecified part of right bronchus or lung: Secondary | ICD-10-CM | POA: Diagnosis not present

## 2017-09-01 DIAGNOSIS — C7931 Secondary malignant neoplasm of brain: Secondary | ICD-10-CM | POA: Diagnosis not present

## 2017-09-01 DIAGNOSIS — I10 Essential (primary) hypertension: Secondary | ICD-10-CM | POA: Diagnosis not present

## 2017-09-01 DIAGNOSIS — G8929 Other chronic pain: Secondary | ICD-10-CM | POA: Diagnosis not present

## 2017-09-04 DIAGNOSIS — I10 Essential (primary) hypertension: Secondary | ICD-10-CM | POA: Diagnosis not present

## 2017-09-04 DIAGNOSIS — C3491 Malignant neoplasm of unspecified part of right bronchus or lung: Secondary | ICD-10-CM | POA: Diagnosis not present

## 2017-09-04 DIAGNOSIS — G8929 Other chronic pain: Secondary | ICD-10-CM | POA: Diagnosis not present

## 2017-09-04 DIAGNOSIS — C7931 Secondary malignant neoplasm of brain: Secondary | ICD-10-CM | POA: Diagnosis not present

## 2017-09-04 DIAGNOSIS — E119 Type 2 diabetes mellitus without complications: Secondary | ICD-10-CM | POA: Diagnosis not present

## 2017-09-04 DIAGNOSIS — E7800 Pure hypercholesterolemia, unspecified: Secondary | ICD-10-CM | POA: Diagnosis not present

## 2017-09-05 DIAGNOSIS — E7800 Pure hypercholesterolemia, unspecified: Secondary | ICD-10-CM | POA: Diagnosis not present

## 2017-09-05 DIAGNOSIS — G8929 Other chronic pain: Secondary | ICD-10-CM | POA: Diagnosis not present

## 2017-09-05 DIAGNOSIS — I10 Essential (primary) hypertension: Secondary | ICD-10-CM | POA: Diagnosis not present

## 2017-09-05 DIAGNOSIS — C3491 Malignant neoplasm of unspecified part of right bronchus or lung: Secondary | ICD-10-CM | POA: Diagnosis not present

## 2017-09-05 DIAGNOSIS — E119 Type 2 diabetes mellitus without complications: Secondary | ICD-10-CM | POA: Diagnosis not present

## 2017-09-05 DIAGNOSIS — C7931 Secondary malignant neoplasm of brain: Secondary | ICD-10-CM | POA: Diagnosis not present

## 2017-09-06 DIAGNOSIS — G8929 Other chronic pain: Secondary | ICD-10-CM | POA: Diagnosis not present

## 2017-09-06 DIAGNOSIS — E7800 Pure hypercholesterolemia, unspecified: Secondary | ICD-10-CM | POA: Diagnosis not present

## 2017-09-06 DIAGNOSIS — E119 Type 2 diabetes mellitus without complications: Secondary | ICD-10-CM | POA: Diagnosis not present

## 2017-09-06 DIAGNOSIS — I10 Essential (primary) hypertension: Secondary | ICD-10-CM | POA: Diagnosis not present

## 2017-09-06 DIAGNOSIS — C7931 Secondary malignant neoplasm of brain: Secondary | ICD-10-CM | POA: Diagnosis not present

## 2017-09-06 DIAGNOSIS — C3491 Malignant neoplasm of unspecified part of right bronchus or lung: Secondary | ICD-10-CM | POA: Diagnosis not present

## 2017-09-08 DIAGNOSIS — C3491 Malignant neoplasm of unspecified part of right bronchus or lung: Secondary | ICD-10-CM | POA: Diagnosis not present

## 2017-09-08 DIAGNOSIS — G8929 Other chronic pain: Secondary | ICD-10-CM | POA: Diagnosis not present

## 2017-09-08 DIAGNOSIS — E119 Type 2 diabetes mellitus without complications: Secondary | ICD-10-CM | POA: Diagnosis not present

## 2017-09-08 DIAGNOSIS — C7931 Secondary malignant neoplasm of brain: Secondary | ICD-10-CM | POA: Diagnosis not present

## 2017-09-08 DIAGNOSIS — I10 Essential (primary) hypertension: Secondary | ICD-10-CM | POA: Diagnosis not present

## 2017-09-08 DIAGNOSIS — E7800 Pure hypercholesterolemia, unspecified: Secondary | ICD-10-CM | POA: Diagnosis not present

## 2017-09-11 DIAGNOSIS — C7931 Secondary malignant neoplasm of brain: Secondary | ICD-10-CM | POA: Diagnosis not present

## 2017-09-11 DIAGNOSIS — G8929 Other chronic pain: Secondary | ICD-10-CM | POA: Diagnosis not present

## 2017-09-11 DIAGNOSIS — E119 Type 2 diabetes mellitus without complications: Secondary | ICD-10-CM | POA: Diagnosis not present

## 2017-09-11 DIAGNOSIS — C3491 Malignant neoplasm of unspecified part of right bronchus or lung: Secondary | ICD-10-CM | POA: Diagnosis not present

## 2017-09-11 DIAGNOSIS — I10 Essential (primary) hypertension: Secondary | ICD-10-CM | POA: Diagnosis not present

## 2017-09-11 DIAGNOSIS — E7800 Pure hypercholesterolemia, unspecified: Secondary | ICD-10-CM | POA: Diagnosis not present

## 2017-09-12 DIAGNOSIS — I10 Essential (primary) hypertension: Secondary | ICD-10-CM | POA: Diagnosis not present

## 2017-09-12 DIAGNOSIS — G8929 Other chronic pain: Secondary | ICD-10-CM | POA: Diagnosis not present

## 2017-09-12 DIAGNOSIS — E7800 Pure hypercholesterolemia, unspecified: Secondary | ICD-10-CM | POA: Diagnosis not present

## 2017-09-12 DIAGNOSIS — E119 Type 2 diabetes mellitus without complications: Secondary | ICD-10-CM | POA: Diagnosis not present

## 2017-09-12 DIAGNOSIS — C3491 Malignant neoplasm of unspecified part of right bronchus or lung: Secondary | ICD-10-CM | POA: Diagnosis not present

## 2017-09-12 DIAGNOSIS — C7931 Secondary malignant neoplasm of brain: Secondary | ICD-10-CM | POA: Diagnosis not present

## 2017-09-13 DIAGNOSIS — C7931 Secondary malignant neoplasm of brain: Secondary | ICD-10-CM | POA: Diagnosis not present

## 2017-09-13 DIAGNOSIS — I10 Essential (primary) hypertension: Secondary | ICD-10-CM | POA: Diagnosis not present

## 2017-09-13 DIAGNOSIS — G8929 Other chronic pain: Secondary | ICD-10-CM | POA: Diagnosis not present

## 2017-09-13 DIAGNOSIS — E7800 Pure hypercholesterolemia, unspecified: Secondary | ICD-10-CM | POA: Diagnosis not present

## 2017-09-13 DIAGNOSIS — E119 Type 2 diabetes mellitus without complications: Secondary | ICD-10-CM | POA: Diagnosis not present

## 2017-09-13 DIAGNOSIS — C3491 Malignant neoplasm of unspecified part of right bronchus or lung: Secondary | ICD-10-CM | POA: Diagnosis not present

## 2017-09-15 DIAGNOSIS — C7931 Secondary malignant neoplasm of brain: Secondary | ICD-10-CM | POA: Diagnosis not present

## 2017-09-15 DIAGNOSIS — I10 Essential (primary) hypertension: Secondary | ICD-10-CM | POA: Diagnosis not present

## 2017-09-15 DIAGNOSIS — G8929 Other chronic pain: Secondary | ICD-10-CM | POA: Diagnosis not present

## 2017-09-15 DIAGNOSIS — C3491 Malignant neoplasm of unspecified part of right bronchus or lung: Secondary | ICD-10-CM | POA: Diagnosis not present

## 2017-09-15 DIAGNOSIS — E119 Type 2 diabetes mellitus without complications: Secondary | ICD-10-CM | POA: Diagnosis not present

## 2017-09-15 DIAGNOSIS — E7800 Pure hypercholesterolemia, unspecified: Secondary | ICD-10-CM | POA: Diagnosis not present

## 2017-09-18 DIAGNOSIS — E119 Type 2 diabetes mellitus without complications: Secondary | ICD-10-CM | POA: Diagnosis not present

## 2017-09-18 DIAGNOSIS — I10 Essential (primary) hypertension: Secondary | ICD-10-CM | POA: Diagnosis not present

## 2017-09-18 DIAGNOSIS — C7931 Secondary malignant neoplasm of brain: Secondary | ICD-10-CM | POA: Diagnosis not present

## 2017-09-18 DIAGNOSIS — E7800 Pure hypercholesterolemia, unspecified: Secondary | ICD-10-CM | POA: Diagnosis not present

## 2017-09-18 DIAGNOSIS — C3491 Malignant neoplasm of unspecified part of right bronchus or lung: Secondary | ICD-10-CM | POA: Diagnosis not present

## 2017-09-18 DIAGNOSIS — G8929 Other chronic pain: Secondary | ICD-10-CM | POA: Diagnosis not present

## 2017-09-19 DIAGNOSIS — C7931 Secondary malignant neoplasm of brain: Secondary | ICD-10-CM | POA: Diagnosis not present

## 2017-09-19 DIAGNOSIS — E7800 Pure hypercholesterolemia, unspecified: Secondary | ICD-10-CM | POA: Diagnosis not present

## 2017-09-19 DIAGNOSIS — G8929 Other chronic pain: Secondary | ICD-10-CM | POA: Diagnosis not present

## 2017-09-19 DIAGNOSIS — E119 Type 2 diabetes mellitus without complications: Secondary | ICD-10-CM | POA: Diagnosis not present

## 2017-09-19 DIAGNOSIS — C3491 Malignant neoplasm of unspecified part of right bronchus or lung: Secondary | ICD-10-CM | POA: Diagnosis not present

## 2017-09-19 DIAGNOSIS — I10 Essential (primary) hypertension: Secondary | ICD-10-CM | POA: Diagnosis not present

## 2017-09-20 DIAGNOSIS — E119 Type 2 diabetes mellitus without complications: Secondary | ICD-10-CM | POA: Diagnosis not present

## 2017-09-20 DIAGNOSIS — I10 Essential (primary) hypertension: Secondary | ICD-10-CM | POA: Diagnosis not present

## 2017-09-20 DIAGNOSIS — C7931 Secondary malignant neoplasm of brain: Secondary | ICD-10-CM | POA: Diagnosis not present

## 2017-09-20 DIAGNOSIS — E7800 Pure hypercholesterolemia, unspecified: Secondary | ICD-10-CM | POA: Diagnosis not present

## 2017-09-20 DIAGNOSIS — G8929 Other chronic pain: Secondary | ICD-10-CM | POA: Diagnosis not present

## 2017-09-20 DIAGNOSIS — C3491 Malignant neoplasm of unspecified part of right bronchus or lung: Secondary | ICD-10-CM | POA: Diagnosis not present

## 2017-09-22 DIAGNOSIS — I10 Essential (primary) hypertension: Secondary | ICD-10-CM | POA: Diagnosis not present

## 2017-09-22 DIAGNOSIS — G8929 Other chronic pain: Secondary | ICD-10-CM | POA: Diagnosis not present

## 2017-09-22 DIAGNOSIS — E7800 Pure hypercholesterolemia, unspecified: Secondary | ICD-10-CM | POA: Diagnosis not present

## 2017-09-22 DIAGNOSIS — E119 Type 2 diabetes mellitus without complications: Secondary | ICD-10-CM | POA: Diagnosis not present

## 2017-09-22 DIAGNOSIS — C3491 Malignant neoplasm of unspecified part of right bronchus or lung: Secondary | ICD-10-CM | POA: Diagnosis not present

## 2017-09-22 DIAGNOSIS — C7931 Secondary malignant neoplasm of brain: Secondary | ICD-10-CM | POA: Diagnosis not present

## 2017-09-23 DIAGNOSIS — C3491 Malignant neoplasm of unspecified part of right bronchus or lung: Secondary | ICD-10-CM | POA: Diagnosis not present

## 2017-09-23 DIAGNOSIS — E7800 Pure hypercholesterolemia, unspecified: Secondary | ICD-10-CM | POA: Diagnosis not present

## 2017-09-23 DIAGNOSIS — E119 Type 2 diabetes mellitus without complications: Secondary | ICD-10-CM | POA: Diagnosis not present

## 2017-09-23 DIAGNOSIS — I10 Essential (primary) hypertension: Secondary | ICD-10-CM | POA: Diagnosis not present

## 2017-09-23 DIAGNOSIS — C7931 Secondary malignant neoplasm of brain: Secondary | ICD-10-CM | POA: Diagnosis not present

## 2017-09-23 DIAGNOSIS — G8929 Other chronic pain: Secondary | ICD-10-CM | POA: Diagnosis not present

## 2017-09-25 DIAGNOSIS — G8929 Other chronic pain: Secondary | ICD-10-CM | POA: Diagnosis not present

## 2017-09-25 DIAGNOSIS — C7931 Secondary malignant neoplasm of brain: Secondary | ICD-10-CM | POA: Diagnosis not present

## 2017-09-25 DIAGNOSIS — E7800 Pure hypercholesterolemia, unspecified: Secondary | ICD-10-CM | POA: Diagnosis not present

## 2017-09-25 DIAGNOSIS — I10 Essential (primary) hypertension: Secondary | ICD-10-CM | POA: Diagnosis not present

## 2017-09-25 DIAGNOSIS — C3491 Malignant neoplasm of unspecified part of right bronchus or lung: Secondary | ICD-10-CM | POA: Diagnosis not present

## 2017-09-25 DIAGNOSIS — E119 Type 2 diabetes mellitus without complications: Secondary | ICD-10-CM | POA: Diagnosis not present

## 2017-09-26 DIAGNOSIS — C7931 Secondary malignant neoplasm of brain: Secondary | ICD-10-CM | POA: Diagnosis not present

## 2017-09-26 DIAGNOSIS — E7800 Pure hypercholesterolemia, unspecified: Secondary | ICD-10-CM | POA: Diagnosis not present

## 2017-09-26 DIAGNOSIS — G8929 Other chronic pain: Secondary | ICD-10-CM | POA: Diagnosis not present

## 2017-09-26 DIAGNOSIS — C3491 Malignant neoplasm of unspecified part of right bronchus or lung: Secondary | ICD-10-CM | POA: Diagnosis not present

## 2017-09-26 DIAGNOSIS — I10 Essential (primary) hypertension: Secondary | ICD-10-CM | POA: Diagnosis not present

## 2017-09-26 DIAGNOSIS — E119 Type 2 diabetes mellitus without complications: Secondary | ICD-10-CM | POA: Diagnosis not present

## 2017-09-27 DIAGNOSIS — I10 Essential (primary) hypertension: Secondary | ICD-10-CM | POA: Diagnosis not present

## 2017-09-27 DIAGNOSIS — G8929 Other chronic pain: Secondary | ICD-10-CM | POA: Diagnosis not present

## 2017-09-27 DIAGNOSIS — E119 Type 2 diabetes mellitus without complications: Secondary | ICD-10-CM | POA: Diagnosis not present

## 2017-09-27 DIAGNOSIS — C3491 Malignant neoplasm of unspecified part of right bronchus or lung: Secondary | ICD-10-CM | POA: Diagnosis not present

## 2017-09-27 DIAGNOSIS — E7800 Pure hypercholesterolemia, unspecified: Secondary | ICD-10-CM | POA: Diagnosis not present

## 2017-09-27 DIAGNOSIS — C7931 Secondary malignant neoplasm of brain: Secondary | ICD-10-CM | POA: Diagnosis not present

## 2017-09-28 DIAGNOSIS — E7800 Pure hypercholesterolemia, unspecified: Secondary | ICD-10-CM | POA: Diagnosis not present

## 2017-09-28 DIAGNOSIS — G8929 Other chronic pain: Secondary | ICD-10-CM | POA: Diagnosis not present

## 2017-09-28 DIAGNOSIS — R339 Retention of urine, unspecified: Secondary | ICD-10-CM | POA: Diagnosis not present

## 2017-09-28 DIAGNOSIS — I712 Thoracic aortic aneurysm, without rupture: Secondary | ICD-10-CM | POA: Diagnosis not present

## 2017-09-28 DIAGNOSIS — I10 Essential (primary) hypertension: Secondary | ICD-10-CM | POA: Diagnosis not present

## 2017-09-28 DIAGNOSIS — E119 Type 2 diabetes mellitus without complications: Secondary | ICD-10-CM | POA: Diagnosis not present

## 2017-09-28 DIAGNOSIS — I4891 Unspecified atrial fibrillation: Secondary | ICD-10-CM | POA: Diagnosis not present

## 2017-09-28 DIAGNOSIS — I5022 Chronic systolic (congestive) heart failure: Secondary | ICD-10-CM | POA: Diagnosis not present

## 2017-09-28 DIAGNOSIS — C7931 Secondary malignant neoplasm of brain: Secondary | ICD-10-CM | POA: Diagnosis not present

## 2017-09-28 DIAGNOSIS — C3491 Malignant neoplasm of unspecified part of right bronchus or lung: Secondary | ICD-10-CM | POA: Diagnosis not present

## 2017-09-29 DIAGNOSIS — E7800 Pure hypercholesterolemia, unspecified: Secondary | ICD-10-CM | POA: Diagnosis not present

## 2017-09-29 DIAGNOSIS — I10 Essential (primary) hypertension: Secondary | ICD-10-CM | POA: Diagnosis not present

## 2017-09-29 DIAGNOSIS — C3491 Malignant neoplasm of unspecified part of right bronchus or lung: Secondary | ICD-10-CM | POA: Diagnosis not present

## 2017-09-29 DIAGNOSIS — C7931 Secondary malignant neoplasm of brain: Secondary | ICD-10-CM | POA: Diagnosis not present

## 2017-09-29 DIAGNOSIS — G8929 Other chronic pain: Secondary | ICD-10-CM | POA: Diagnosis not present

## 2017-09-29 DIAGNOSIS — E119 Type 2 diabetes mellitus without complications: Secondary | ICD-10-CM | POA: Diagnosis not present

## 2017-10-03 ENCOUNTER — Encounter: Payer: Self-pay | Admitting: Radiation Therapy

## 2017-10-03 NOTE — Progress Notes (Signed)
   Pt expired Oct 07, 2017

## 2017-10-28 DEATH — deceased
